# Patient Record
Sex: Female | Born: 1955 | Race: White | Hispanic: No | Smoking: Never smoker
Health system: Southern US, Community
[De-identification: ages and names within clinical notes are randomized; demographics above are authoritative.]

## PROBLEM LIST (undated history)

## (undated) DIAGNOSIS — N390 Urinary tract infection, site not specified: Secondary | ICD-10-CM

## (undated) DIAGNOSIS — E785 Hyperlipidemia, unspecified: Secondary | ICD-10-CM

## (undated) DIAGNOSIS — N3281 Overactive bladder: Secondary | ICD-10-CM

## (undated) DIAGNOSIS — G245 Blepharospasm: Secondary | ICD-10-CM

## (undated) DIAGNOSIS — F329 Major depressive disorder, single episode, unspecified: Secondary | ICD-10-CM

## (undated) DIAGNOSIS — F32A Depression, unspecified: Secondary | ICD-10-CM

## (undated) DIAGNOSIS — I10 Essential (primary) hypertension: Secondary | ICD-10-CM

## (undated) DIAGNOSIS — F419 Anxiety disorder, unspecified: Secondary | ICD-10-CM

## (undated) DIAGNOSIS — E119 Type 2 diabetes mellitus without complications: Secondary | ICD-10-CM

## (undated) DIAGNOSIS — K589 Irritable bowel syndrome without diarrhea: Secondary | ICD-10-CM

## (undated) DIAGNOSIS — K219 Gastro-esophageal reflux disease without esophagitis: Secondary | ICD-10-CM

## (undated) DIAGNOSIS — G4730 Sleep apnea, unspecified: Secondary | ICD-10-CM

## (undated) HISTORY — DX: Overactive bladder: N32.81

## (undated) HISTORY — DX: Hyperlipidemia, unspecified: E78.5

## (undated) HISTORY — DX: Urinary tract infection, site not specified: N39.0

## (undated) HISTORY — DX: Irritable bowel syndrome, unspecified: K58.9

## (undated) HISTORY — DX: Gastro-esophageal reflux disease without esophagitis: K21.9

## (undated) HISTORY — PX: CARPAL TUNNEL RELEASE: SHX101

## (undated) HISTORY — DX: Anxiety disorder, unspecified: F41.9

## (undated) HISTORY — PX: CHOLECYSTECTOMY: SHX55

## (undated) HISTORY — PX: COLONOSCOPY: SHX174

## (undated) HISTORY — DX: Blepharospasm: G24.5

## (undated) HISTORY — DX: Type 2 diabetes mellitus without complications: E11.9

## (undated) HISTORY — DX: Major depressive disorder, single episode, unspecified: F32.9

## (undated) HISTORY — DX: Depression, unspecified: F32.A

## (undated) HISTORY — DX: Essential (primary) hypertension: I10

---

## 2000-12-21 ENCOUNTER — Encounter: Payer: Self-pay | Admitting: Emergency Medicine

## 2000-12-21 ENCOUNTER — Emergency Department (HOSPITAL_COMMUNITY): Admission: EM | Admit: 2000-12-21 | Discharge: 2000-12-21 | Payer: Self-pay | Admitting: Emergency Medicine

## 2000-12-28 ENCOUNTER — Encounter: Payer: Self-pay | Admitting: Internal Medicine

## 2000-12-28 ENCOUNTER — Ambulatory Visit (HOSPITAL_COMMUNITY): Admission: RE | Admit: 2000-12-28 | Discharge: 2000-12-28 | Payer: Self-pay | Admitting: Internal Medicine

## 2003-12-17 ENCOUNTER — Ambulatory Visit (HOSPITAL_COMMUNITY): Admission: RE | Admit: 2003-12-17 | Discharge: 2003-12-17 | Payer: Self-pay | Admitting: Internal Medicine

## 2004-02-15 ENCOUNTER — Ambulatory Visit (HOSPITAL_COMMUNITY): Admission: RE | Admit: 2004-02-15 | Discharge: 2004-02-15 | Payer: Self-pay | Admitting: Family Medicine

## 2005-01-04 ENCOUNTER — Ambulatory Visit (HOSPITAL_COMMUNITY): Admission: RE | Admit: 2005-01-04 | Discharge: 2005-01-04 | Payer: Self-pay | Admitting: Pulmonary Disease

## 2005-09-25 ENCOUNTER — Ambulatory Visit: Payer: Self-pay | Admitting: Psychiatry

## 2005-09-25 ENCOUNTER — Other Ambulatory Visit (HOSPITAL_COMMUNITY): Admission: RE | Admit: 2005-09-25 | Discharge: 2005-12-24 | Payer: Self-pay | Admitting: Psychiatry

## 2006-01-29 ENCOUNTER — Ambulatory Visit (HOSPITAL_COMMUNITY): Payer: Self-pay | Admitting: Psychiatry

## 2006-03-29 ENCOUNTER — Ambulatory Visit (HOSPITAL_COMMUNITY): Payer: Self-pay | Admitting: *Deleted

## 2006-05-22 ENCOUNTER — Ambulatory Visit (HOSPITAL_COMMUNITY): Payer: Self-pay | Admitting: *Deleted

## 2006-06-26 ENCOUNTER — Ambulatory Visit (HOSPITAL_COMMUNITY): Payer: Self-pay | Admitting: *Deleted

## 2006-07-17 ENCOUNTER — Ambulatory Visit (HOSPITAL_COMMUNITY): Payer: Self-pay | Admitting: *Deleted

## 2006-08-21 ENCOUNTER — Ambulatory Visit (HOSPITAL_COMMUNITY): Payer: Self-pay | Admitting: *Deleted

## 2006-10-18 ENCOUNTER — Ambulatory Visit (HOSPITAL_COMMUNITY): Payer: Self-pay | Admitting: *Deleted

## 2006-11-13 ENCOUNTER — Ambulatory Visit (HOSPITAL_COMMUNITY): Payer: Self-pay | Admitting: *Deleted

## 2006-12-18 ENCOUNTER — Ambulatory Visit (HOSPITAL_COMMUNITY): Payer: Self-pay | Admitting: *Deleted

## 2007-01-15 ENCOUNTER — Ambulatory Visit (HOSPITAL_COMMUNITY): Payer: Self-pay | Admitting: *Deleted

## 2007-02-19 ENCOUNTER — Ambulatory Visit (HOSPITAL_COMMUNITY): Payer: Self-pay | Admitting: *Deleted

## 2007-03-19 ENCOUNTER — Ambulatory Visit (HOSPITAL_COMMUNITY): Payer: Self-pay | Admitting: *Deleted

## 2007-04-16 ENCOUNTER — Ambulatory Visit (HOSPITAL_COMMUNITY): Payer: Self-pay | Admitting: *Deleted

## 2007-06-18 ENCOUNTER — Ambulatory Visit (HOSPITAL_COMMUNITY): Payer: Self-pay | Admitting: *Deleted

## 2007-07-16 ENCOUNTER — Ambulatory Visit (HOSPITAL_COMMUNITY): Payer: Self-pay | Admitting: *Deleted

## 2007-09-17 ENCOUNTER — Ambulatory Visit (HOSPITAL_COMMUNITY): Payer: Self-pay | Admitting: *Deleted

## 2007-10-15 ENCOUNTER — Ambulatory Visit (HOSPITAL_COMMUNITY): Payer: Self-pay | Admitting: *Deleted

## 2007-11-12 ENCOUNTER — Ambulatory Visit (HOSPITAL_COMMUNITY): Payer: Self-pay | Admitting: *Deleted

## 2007-12-17 ENCOUNTER — Ambulatory Visit (HOSPITAL_COMMUNITY): Payer: Self-pay | Admitting: *Deleted

## 2008-01-14 ENCOUNTER — Ambulatory Visit (HOSPITAL_COMMUNITY): Payer: Self-pay | Admitting: *Deleted

## 2008-02-13 ENCOUNTER — Ambulatory Visit (HOSPITAL_COMMUNITY): Payer: Self-pay | Admitting: *Deleted

## 2008-02-27 ENCOUNTER — Ambulatory Visit (HOSPITAL_COMMUNITY): Admission: RE | Admit: 2008-02-27 | Discharge: 2008-02-27 | Payer: Self-pay | Admitting: Family Medicine

## 2008-03-12 ENCOUNTER — Ambulatory Visit (HOSPITAL_COMMUNITY): Payer: Self-pay | Admitting: *Deleted

## 2008-04-21 ENCOUNTER — Ambulatory Visit (HOSPITAL_COMMUNITY): Payer: Self-pay | Admitting: *Deleted

## 2008-06-16 ENCOUNTER — Ambulatory Visit (HOSPITAL_COMMUNITY): Payer: Self-pay | Admitting: *Deleted

## 2008-08-18 ENCOUNTER — Ambulatory Visit (HOSPITAL_COMMUNITY): Payer: Self-pay | Admitting: *Deleted

## 2008-10-15 ENCOUNTER — Ambulatory Visit (HOSPITAL_COMMUNITY): Payer: Self-pay | Admitting: *Deleted

## 2008-12-29 ENCOUNTER — Ambulatory Visit (HOSPITAL_COMMUNITY): Payer: Self-pay | Admitting: *Deleted

## 2009-03-04 ENCOUNTER — Ambulatory Visit (HOSPITAL_COMMUNITY): Payer: Self-pay | Admitting: *Deleted

## 2009-07-28 ENCOUNTER — Ambulatory Visit (HOSPITAL_COMMUNITY): Payer: Self-pay | Admitting: Psychiatry

## 2009-08-23 ENCOUNTER — Ambulatory Visit (HOSPITAL_COMMUNITY): Payer: Self-pay | Admitting: Psychiatry

## 2009-09-23 ENCOUNTER — Ambulatory Visit (HOSPITAL_COMMUNITY): Payer: Self-pay | Admitting: Psychiatry

## 2009-10-14 ENCOUNTER — Ambulatory Visit (HOSPITAL_COMMUNITY): Payer: Self-pay | Admitting: Psychiatry

## 2009-11-11 ENCOUNTER — Ambulatory Visit (HOSPITAL_COMMUNITY): Payer: Self-pay | Admitting: Psychiatry

## 2009-12-14 ENCOUNTER — Ambulatory Visit (HOSPITAL_COMMUNITY): Payer: Self-pay | Admitting: Psychiatry

## 2010-01-13 ENCOUNTER — Ambulatory Visit (HOSPITAL_COMMUNITY): Payer: Self-pay | Admitting: Psychiatry

## 2010-02-08 ENCOUNTER — Ambulatory Visit (HOSPITAL_COMMUNITY): Admission: RE | Admit: 2010-02-08 | Discharge: 2010-02-08 | Payer: Self-pay | Admitting: Internal Medicine

## 2010-02-23 ENCOUNTER — Ambulatory Visit (HOSPITAL_COMMUNITY): Admission: RE | Admit: 2010-02-23 | Discharge: 2010-02-23 | Payer: Self-pay | Admitting: Family Medicine

## 2010-03-15 ENCOUNTER — Ambulatory Visit (HOSPITAL_COMMUNITY): Payer: Self-pay | Admitting: Psychiatry

## 2010-05-12 ENCOUNTER — Ambulatory Visit (HOSPITAL_COMMUNITY): Payer: Self-pay | Admitting: Psychiatry

## 2010-06-10 ENCOUNTER — Ambulatory Visit (HOSPITAL_COMMUNITY): Admission: RE | Admit: 2010-06-10 | Discharge: 2010-06-10 | Payer: Self-pay | Admitting: Family Medicine

## 2010-06-23 ENCOUNTER — Ambulatory Visit (HOSPITAL_COMMUNITY): Payer: Self-pay | Admitting: Psychiatry

## 2010-08-15 ENCOUNTER — Ambulatory Visit (HOSPITAL_COMMUNITY)
Admission: RE | Admit: 2010-08-15 | Discharge: 2010-08-15 | Payer: Self-pay | Source: Home / Self Care | Admitting: Family Medicine

## 2010-08-25 ENCOUNTER — Ambulatory Visit (HOSPITAL_COMMUNITY): Payer: Self-pay | Admitting: Psychiatry

## 2010-09-09 ENCOUNTER — Ambulatory Visit
Admission: RE | Admit: 2010-09-09 | Discharge: 2010-09-09 | Payer: Self-pay | Source: Home / Self Care | Attending: Orthopedic Surgery | Admitting: Orthopedic Surgery

## 2010-11-15 ENCOUNTER — Encounter (INDEPENDENT_AMBULATORY_CARE_PROVIDER_SITE_OTHER): Payer: 59 | Admitting: Psychiatry

## 2010-11-15 DIAGNOSIS — F331 Major depressive disorder, recurrent, moderate: Secondary | ICD-10-CM

## 2010-11-21 LAB — POCT HEMOGLOBIN-HEMACUE: Hemoglobin: 13.4 g/dL (ref 12.0–15.0)

## 2010-12-15 ENCOUNTER — Encounter (INDEPENDENT_AMBULATORY_CARE_PROVIDER_SITE_OTHER): Payer: 59 | Admitting: Psychiatry

## 2010-12-15 DIAGNOSIS — F331 Major depressive disorder, recurrent, moderate: Secondary | ICD-10-CM

## 2011-01-12 ENCOUNTER — Encounter (INDEPENDENT_AMBULATORY_CARE_PROVIDER_SITE_OTHER): Payer: 59 | Admitting: Psychiatry

## 2011-01-12 DIAGNOSIS — F331 Major depressive disorder, recurrent, moderate: Secondary | ICD-10-CM

## 2011-01-27 NOTE — Procedures (Signed)
Aimee Reed, Aimee Reed               ACCOUNT NO.:  [id number]   MEDICAL RECORD NO.:  [id number]          PATIENT TYPE:  OUT   LOCATION:  RAD                           FACILITY:  [facility]   PHYSICIAN:  Edward L. Juanetta Gosling, M.D.DATE OF BIRTH:  Feb 08, 1956   DATE OF PROCEDURE:  01/04/2005  DATE OF DISCHARGE:                              PULMONARY FUNCTION TEST   INTERPRETATION:  1.  Spirometry is normal.  2.  Lung volumes are normal.  3.  DLCO is minimally reduced.  4.  Arterial blood gases are normal.      ELH/MEDQ  D:  01/04/2005  T:  01/04/2005  Job:  [id number]

## 2011-02-18 ENCOUNTER — Emergency Department (HOSPITAL_COMMUNITY)
Admission: EM | Admit: 2011-02-18 | Discharge: 2011-02-18 | Disposition: A | Discharge status: Home / Self Care | Payer: Medicare Other | Attending: Emergency Medicine | Admitting: Emergency Medicine

## 2011-02-18 ENCOUNTER — Emergency Department (HOSPITAL_COMMUNITY): Payer: Medicare Other

## 2011-02-18 DIAGNOSIS — F3289 Other specified depressive episodes: Secondary | ICD-10-CM | POA: Insufficient documentation

## 2011-02-18 DIAGNOSIS — K219 Gastro-esophageal reflux disease without esophagitis: Secondary | ICD-10-CM | POA: Insufficient documentation

## 2011-02-18 DIAGNOSIS — F329 Major depressive disorder, single episode, unspecified: Secondary | ICD-10-CM | POA: Insufficient documentation

## 2011-02-18 DIAGNOSIS — R209 Unspecified disturbances of skin sensation: Secondary | ICD-10-CM | POA: Insufficient documentation

## 2011-02-18 DIAGNOSIS — R51 Headache: Secondary | ICD-10-CM | POA: Insufficient documentation

## 2011-02-18 DIAGNOSIS — Z79899 Other long term (current) drug therapy: Secondary | ICD-10-CM | POA: Insufficient documentation

## 2011-02-18 LAB — CBC
HCT: 40.9 % (ref 36.0–46.0)
Hemoglobin: 13.4 g/dL (ref 12.0–15.0)
MCH: 30 pg (ref 26.0–34.0)
MCV: 91.5 fL (ref 78.0–100.0)
Platelets: 233 10*3/uL (ref 150–400)
RDW: 13.3 % (ref 11.5–15.5)

## 2011-02-18 LAB — BASIC METABOLIC PANEL
BUN: 9 mg/dL (ref 6–23)
Chloride: 98 mEq/L (ref 96–112)
Glucose, Bld: 138 mg/dL — ABNORMAL HIGH (ref 70–99)
Potassium: 3.7 mEq/L (ref 3.5–5.1)

## 2011-02-18 LAB — URINALYSIS, ROUTINE W REFLEX MICROSCOPIC: Glucose, UA: NEGATIVE mg/dL

## 2011-02-18 LAB — CK TOTAL AND CKMB (NOT AT ARMC): Total CK: 382 U/L — ABNORMAL HIGH (ref 7–177)

## 2011-02-20 ENCOUNTER — Other Ambulatory Visit (HOSPITAL_COMMUNITY): Payer: Self-pay | Admitting: Internal Medicine

## 2011-02-20 DIAGNOSIS — R51 Headache: Secondary | ICD-10-CM

## 2011-02-20 DIAGNOSIS — G459 Transient cerebral ischemic attack, unspecified: Secondary | ICD-10-CM

## 2011-02-20 DIAGNOSIS — I10 Essential (primary) hypertension: Secondary | ICD-10-CM

## 2011-02-21 ENCOUNTER — Encounter (HOSPITAL_COMMUNITY): Payer: 59 | Admitting: Psychiatry

## 2011-02-22 ENCOUNTER — Ambulatory Visit (HOSPITAL_COMMUNITY)
Admission: RE | Admit: 2011-02-22 | Discharge: 2011-02-22 | Disposition: A | Discharge status: Home / Self Care | Payer: Medicare Other | Source: Ambulatory Visit | Attending: Internal Medicine | Admitting: Internal Medicine

## 2011-02-22 DIAGNOSIS — R51 Headache: Secondary | ICD-10-CM | POA: Insufficient documentation

## 2011-02-22 DIAGNOSIS — R209 Unspecified disturbances of skin sensation: Secondary | ICD-10-CM | POA: Insufficient documentation

## 2011-02-22 DIAGNOSIS — I10 Essential (primary) hypertension: Secondary | ICD-10-CM

## 2011-02-22 DIAGNOSIS — G459 Transient cerebral ischemic attack, unspecified: Secondary | ICD-10-CM | POA: Insufficient documentation

## 2011-02-23 ENCOUNTER — Encounter (INDEPENDENT_AMBULATORY_CARE_PROVIDER_SITE_OTHER): Payer: Medicare Other | Admitting: Psychiatry

## 2011-02-23 DIAGNOSIS — F331 Major depressive disorder, recurrent, moderate: Secondary | ICD-10-CM

## 2011-03-23 ENCOUNTER — Other Ambulatory Visit (HOSPITAL_COMMUNITY): Payer: Self-pay | Admitting: Internal Medicine

## 2011-03-23 DIAGNOSIS — R51 Headache: Secondary | ICD-10-CM

## 2011-03-24 ENCOUNTER — Ambulatory Visit (HOSPITAL_COMMUNITY)
Admission: RE | Admit: 2011-03-24 | Discharge: 2011-03-24 | Disposition: A | Discharge status: Home / Self Care | Payer: Medicare Other | Source: Ambulatory Visit | Attending: Internal Medicine | Admitting: Internal Medicine

## 2011-03-24 DIAGNOSIS — R51 Headache: Secondary | ICD-10-CM | POA: Insufficient documentation

## 2011-03-24 DIAGNOSIS — H538 Other visual disturbances: Secondary | ICD-10-CM | POA: Insufficient documentation

## 2011-04-20 ENCOUNTER — Encounter (INDEPENDENT_AMBULATORY_CARE_PROVIDER_SITE_OTHER): Payer: Medicare Other | Admitting: Psychiatry

## 2011-04-20 DIAGNOSIS — F331 Major depressive disorder, recurrent, moderate: Secondary | ICD-10-CM

## 2011-06-20 ENCOUNTER — Encounter (INDEPENDENT_AMBULATORY_CARE_PROVIDER_SITE_OTHER): Payer: Medicare Other | Admitting: Psychiatry

## 2011-06-20 DIAGNOSIS — F331 Major depressive disorder, recurrent, moderate: Secondary | ICD-10-CM

## 2011-07-18 ENCOUNTER — Encounter (HOSPITAL_COMMUNITY): Payer: Self-pay | Admitting: Psychiatry

## 2011-07-18 ENCOUNTER — Ambulatory Visit (INDEPENDENT_AMBULATORY_CARE_PROVIDER_SITE_OTHER): Payer: Medicare Other | Admitting: Psychiatry

## 2011-07-18 VITALS — Wt 263.0 lb

## 2011-07-18 DIAGNOSIS — F331 Major depressive disorder, recurrent, moderate: Secondary | ICD-10-CM

## 2011-07-18 MED ORDER — ZIPRASIDONE HCL 40 MG PO CAPS: 40.0000 mg | ORAL_CAPSULE | Freq: Once | ORAL | Status: DC

## 2011-07-18 MED ORDER — BUPROPION HCL ER (XL) 300 MG PO TB24: 300.0000 mg | ORAL_TABLET | ORAL | Status: DC

## 2011-07-18 NOTE — Progress Notes (Signed)
Patient seen today for her followup appointment. She is now taking Geodon 40 mg. She is sleeping better and does not have any complaints of excessive sleep. Her mood has been also improved along with her attention and concentration. There are no anger crying spells agitation or social isolation. She does not want to change her current medication. She reported no side effects of medication. There are no abnormal movements noted. Patient is anxious but pleasant and cooperative she denies any active or passive suicidal thinking or any auditory or visual hallucination. Her attention and concentration is improved. There were no psychotic symptoms present. She is alert and oriented x3. Her memory and concentration is okay. Assessment Maj. depressive disorder recurrent Plan I have reviewed all his medication history we will continue her medication which are Cymbalta 30 mg daily Wellbutrin 300 mg daily and Geodon 40 mg daily I have explained the risks and benefits of medication in detail and I will see her again in 2 months, time spent 20 minutes. 8 weeks of Cymbalta 30 mg samples were given

## 2011-07-18 NOTE — Patient Instructions (Signed)
Take all you medications and see [place] in two months

## 2011-07-19 ENCOUNTER — Ambulatory Visit (INDEPENDENT_AMBULATORY_CARE_PROVIDER_SITE_OTHER): Payer: Medicare Other | Admitting: Gastroenterology

## 2011-07-19 ENCOUNTER — Encounter: Payer: Self-pay | Admitting: Gastroenterology

## 2011-07-19 VITALS — BP 151/88 | HR 90 | Temp 98.9°F | Ht 64.0 in | Wt 264.4 lb

## 2011-07-19 DIAGNOSIS — R1314 Dysphagia, pharyngoesophageal phase: Secondary | ICD-10-CM

## 2011-07-19 DIAGNOSIS — K219 Gastro-esophageal reflux disease without esophagitis: Secondary | ICD-10-CM

## 2011-07-19 DIAGNOSIS — R1310 Dysphagia, unspecified: Secondary | ICD-10-CM

## 2011-07-19 NOTE — Patient Instructions (Signed)
We have scheduled you for an upper endoscopy. Please see separate instructions.  I have requested your recent labwork and past colonoscopy, we will call you if you need any further instructions.

## 2011-07-19 NOTE — Progress Notes (Signed)
Primary Care Physician:  Cassell Smiles., MD  Primary Gastroenterologist:  Roetta Sessions, MD   Chief Complaint  Patient presents with  . Dysphagia    with pills and some food    HPI:  Aimee Reed is a 55 y.o. female here for further evaluation of esophageal dysphagia. She complains of difficulty swallowing for some time, worsening recently. She has most problems swallowing bread and apples. She also has problems swallowing her pills especially the one she takes at night. Sometimes it takes her 45 minutes to wash the pills down with food. Denies odynophagia, vomiting, abdominal pain. Heartburn on prilosec for one year. Denies constipation, diarrhea, melena, rectal bleeding. No prior upper endoscopy.   Current Outpatient Prescriptions  Medication Sig Dispense Refill  . amLODipine (NORVASC) 5 MG tablet Take 5 mg by mouth daily.        Marland Kitchen aspirin 81 MG tablet Take 81 mg by mouth daily.        Marland Kitchen buPROPion (WELLBUTRIN XL) 300 MG 24 hr tablet Take 1 tablet (300 mg total) by mouth every morning.  30 tablet  1  . DULoxetine (CYMBALTA) 30 MG capsule Take 30 mg by mouth daily.        Marland Kitchen EVENING PRIMROSE OIL PO Take by mouth daily.        . Nitrofurantoin Macrocrystal (MACRODANTIN PO) Take by mouth daily.        . Nutritional Supplements (ESTROVEN PO) Take by mouth daily.        Marland Kitchen omeprazole (PRILOSEC) 20 MG capsule Take 20 mg by mouth daily.        . ziprasidone (GEODON) 40 MG capsule Take 1 capsule (40 mg total) by mouth once.  30 capsule  1    Allergies as of 07/19/2011  . (No Known Allergies)    Past Medical History  Diagnosis Date  . GERD (gastroesophageal reflux disease)   . Irritable bowel syndrome   . Overactive bladder   . HTN (hypertension)   . Recurrent UTI     chronic macrodantin    Past Surgical History  Procedure Date  . Cholecystectomy   . Carpal tunnel release     both hands  . Colonoscopy     Dr. Lovell Sheehan, around 2010    Family History  Problem Relation Age  of Onset  . Depression Father   . Colon cancer Neg Hx   . Liver disease Neg Hx   . Ulcers Mother     History   Social History  . Marital Status: Married    Spouse Name: N/A    Number of Children: 2  . Years of Education: N/A   Occupational History  . unemployed    Social History Main Topics  . Smoking status: Never Smoker   . Smokeless tobacco: Not on file  . Alcohol Use: No  . Drug Use: No  . Sexually Active: Not on file   Other Topics Concern  . Not on file   Social History Narrative  . No narrative on file      ROS:  General: Negative for anorexia, weight loss, fever, chills, fatigue, weakness. Eyes: Negative for vision changes.  ENT: Negative for hoarseness, difficulty swallowing , nasal congestion. CV: Negative for chest pain, angina, palpitations, dyspnea on exertion, peripheral edema.  Respiratory: Negative for dyspnea at rest, dyspnea on exertion, cough, sputum, wheezing.  GI: See history of present illness. GU:  Negative for dysuria, hematuria, urinary incontinence, urinary frequency, nocturnal urination.  MS: Negative  for joint pain, low back pain.  Derm: Negative for rash or itching.  Neuro: Negative for weakness, abnormal sensation, seizure, frequent headaches, memory loss, confusion.  Psych: Positive for depression and anxiety but states both are well controlled. Negative for suicidal ideation, hallucinations.  Endo: Negative for unusual weight change.  Heme: Negative for bruising or bleeding. Allergy: Negative for rash or hives.    Physical Examination:  BP 151/88  Pulse 90  Temp(Src) 98.9 F (37.2 C) (Temporal)  Ht 5\' 4"  (1.626 m)  Wt 264 lb 6.4 oz (119.931 kg)  BMI 45.38 kg/m2   General: Well-nourished, well-developed in no acute distress. Obese. Head: Normocephalic, atraumatic.   Eyes: Conjunctiva pink, no icterus. Mouth: Oropharyngeal mucosa moist and pink , no lesions erythema or exudate. Neck: Supple without thyromegaly, masses, or  lymphadenopathy.  Lungs: Clear to auscultation bilaterally.  Heart: Regular rate and rhythm, no murmurs rubs or gallops.  Abdomen: Bowel sounds are normal, nontender, nondistended, no hepatosplenomegaly or masses, no abdominal bruits or    hernia , no rebound or guarding.   Rectal: Not performed. Extremities: Trace lower extremity edema. No clubbing or deformities.  Neuro: Alert and oriented x 4 , grossly normal neurologically.  Skin: Warm and dry, no rash or jaundice.   Psych: Alert and cooperative, normal mood and affect.  Labs: Lab Results  Component Value Date   WBC 8.1 02/18/2011   HGB 13.4 02/18/2011   HCT 40.9 02/18/2011   MCV 91.5 02/18/2011   PLT 233 02/18/2011   Lab Results  Component Value Date   CREATININE 0.65 02/18/2011   BUN 9 02/18/2011   NA 136 02/18/2011   K 3.7 02/18/2011   CL 98 02/18/2011   CO2 27 02/18/2011   No results found for this basename: ALT, AST, GGT, ALKPHOS, BILITOT     Imaging Studies: No results found.

## 2011-07-19 NOTE — Progress Notes (Signed)
Cc to PCP 

## 2011-07-19 NOTE — Assessment & Plan Note (Signed)
Chronic worsening esophageal dysphagia to solid foods and pills. Differential diagnosis includes esophageal web, ring, stricture, esophagitis. Recommend EGD with possible dilation in the near future. She states she had adequate conscious sedation previously at time of colonoscopy 2 years ago while she was on multiple antidepressants. I will retrieve those records and if there are any concerns we will regroup and plan for deep sedation. Patient is in agreement with this approach.  I have discussed the risks, alternatives, benefits with regards to but not limited to the risk of reaction to medication, bleeding, infection, perforation and the patient is agreeable to proceed. Written consent to be obtained.

## 2011-07-25 ENCOUNTER — Encounter (HOSPITAL_COMMUNITY): Payer: Self-pay | Admitting: Pharmacy Technician

## 2011-07-28 MED ORDER — SODIUM CHLORIDE 0.45 % IV SOLN
Freq: Once | INTRAVENOUS | Status: AC
  Administered 2011-07-31: 12:00:00 via INTRAVENOUS

## 2011-07-31 ENCOUNTER — Other Ambulatory Visit: Payer: Self-pay | Admitting: Internal Medicine

## 2011-07-31 ENCOUNTER — Encounter (HOSPITAL_COMMUNITY): Payer: Self-pay

## 2011-07-31 ENCOUNTER — Ambulatory Visit (HOSPITAL_COMMUNITY)
Admission: RE | Admit: 2011-07-31 | Discharge: 2011-07-31 | Disposition: A | Discharge status: Home / Self Care | Payer: Medicare Other | Source: Ambulatory Visit | Attending: Internal Medicine | Admitting: Internal Medicine

## 2011-07-31 ENCOUNTER — Encounter (HOSPITAL_COMMUNITY)
Admission: RE | Disposition: A | Discharge status: Home / Self Care | Payer: Self-pay | Source: Ambulatory Visit | Attending: Internal Medicine

## 2011-07-31 DIAGNOSIS — R1310 Dysphagia, unspecified: Secondary | ICD-10-CM | POA: Insufficient documentation

## 2011-07-31 DIAGNOSIS — Z79899 Other long term (current) drug therapy: Secondary | ICD-10-CM | POA: Insufficient documentation

## 2011-07-31 DIAGNOSIS — Z7982 Long term (current) use of aspirin: Secondary | ICD-10-CM | POA: Insufficient documentation

## 2011-07-31 DIAGNOSIS — I10 Essential (primary) hypertension: Secondary | ICD-10-CM | POA: Insufficient documentation

## 2011-07-31 DIAGNOSIS — K219 Gastro-esophageal reflux disease without esophagitis: Secondary | ICD-10-CM

## 2011-07-31 DIAGNOSIS — Q391 Atresia of esophagus with tracheo-esophageal fistula: Secondary | ICD-10-CM | POA: Insufficient documentation

## 2011-07-31 DIAGNOSIS — K2270 Barrett's esophagus without dysplasia: Secondary | ICD-10-CM | POA: Insufficient documentation

## 2011-07-31 DIAGNOSIS — R635 Abnormal weight gain: Secondary | ICD-10-CM | POA: Insufficient documentation

## 2011-07-31 HISTORY — PX: ESOPHAGOGASTRODUODENOSCOPY: SHX5428

## 2011-07-31 SURGERY — EGD (ESOPHAGOGASTRODUODENOSCOPY)
Anesthesia: Moderate Sedation

## 2011-07-31 MED ORDER — MEPERIDINE HCL 100 MG/ML IJ SOLN
INTRAMUSCULAR | Status: AC
  Filled 2011-07-31: qty 2

## 2011-07-31 MED ORDER — OMEPRAZOLE 20 MG PO CPDR: 20.0000 mg | DELAYED_RELEASE_CAPSULE | Freq: Two times a day (BID) | ORAL | Status: DC

## 2011-07-31 MED ORDER — MIDAZOLAM HCL 5 MG/5ML IJ SOLN
INTRAMUSCULAR | Status: AC
  Filled 2011-07-31: qty 10

## 2011-07-31 MED ORDER — STERILE WATER FOR IRRIGATION IR SOLN
Status: DC | PRN
  Administered 2011-07-31: 13:00:00

## 2011-07-31 MED ORDER — BUTAMBEN-TETRACAINE-BENZOCAINE 2-2-14 % EX AERO
INHALATION_SPRAY | CUTANEOUS | Status: DC | PRN
  Administered 2011-07-31: 2 via TOPICAL

## 2011-07-31 MED ORDER — MEPERIDINE HCL 100 MG/ML IJ SOLN
INTRAMUSCULAR | Status: DC | PRN
  Administered 2011-07-31: 50 mg via INTRAVENOUS
  Administered 2011-07-31: 25 mg via INTRAVENOUS

## 2011-07-31 MED ORDER — MIDAZOLAM HCL 5 MG/5ML IJ SOLN
INTRAMUSCULAR | Status: DC | PRN
  Administered 2011-07-31: 2 mg via INTRAVENOUS
  Administered 2011-07-31: 1 mg via INTRAVENOUS
  Administered 2011-07-31: 2 mg via INTRAVENOUS

## 2011-07-31 NOTE — H&P (Signed)
  I have seen & examined the patient prior to the procedure(s) today and reviewed the history and physical/consultation.  There have been no changes.  After consideration of the risks, benefits, alternatives and imponderables, the patient has consented to the procedure(s).   

## 2011-08-07 ENCOUNTER — Encounter (HOSPITAL_COMMUNITY): Payer: Self-pay | Admitting: Internal Medicine

## 2011-08-10 ENCOUNTER — Encounter: Payer: Self-pay | Admitting: Internal Medicine

## 2011-08-12 ENCOUNTER — Other Ambulatory Visit (HOSPITAL_COMMUNITY): Payer: Self-pay

## 2011-09-19 ENCOUNTER — Ambulatory Visit (HOSPITAL_COMMUNITY): Payer: Medicare Other | Admitting: Psychiatry

## 2011-10-12 ENCOUNTER — Ambulatory Visit (INDEPENDENT_AMBULATORY_CARE_PROVIDER_SITE_OTHER): Payer: Medicare Other | Admitting: Psychiatry

## 2011-10-12 ENCOUNTER — Encounter (HOSPITAL_COMMUNITY): Payer: Self-pay | Admitting: Psychiatry

## 2011-10-12 VITALS — Wt 265.0 lb

## 2011-10-12 DIAGNOSIS — F331 Major depressive disorder, recurrent, moderate: Secondary | ICD-10-CM

## 2011-10-12 MED ORDER — ZIPRASIDONE HCL 40 MG PO CAPS: 40.0000 mg | ORAL_CAPSULE | Freq: Once | ORAL | Status: DC

## 2011-10-12 MED ORDER — BUPROPION HCL ER (XL) 300 MG PO TB24: 300.0000 mg | ORAL_TABLET | ORAL | Status: DC

## 2011-10-12 NOTE — Progress Notes (Signed)
Patient seen today for her followup appointment. She had a very good Christmas. She likes her current medication. She is not feeling tired in the morning. She denies any crying spells or any agitation. She reported her current regime is working very well. She has no side effects or concern about the medication. She is also seeing therapist Dr. Fransico Michael on regular basis.   Mental status examination Patient is anxious but pleasant and cooperative she denies any active or passive suicidal thinking or any auditory or visual hallucination. She described her mood is good and her affect is mood congruent. Her attention and concentration is improved. There were no psychotic symptoms present. She is alert and oriented x3. Her memory and concentration is okay.  Assessment Maj. depressive disorder recurrent  Plan I will continue her current medication which is Cymbalta Wellbutrin and Geodon. I explained risks and benefits of medication. We had tried in the past with one antidepressant however patient decompensated. So far she is doing better on this regime.  I will see her again in 2 months. Samples of Cymbalta 30 mg given

## 2011-12-05 ENCOUNTER — Other Ambulatory Visit (HOSPITAL_COMMUNITY): Payer: Self-pay | Admitting: Family Medicine

## 2011-12-05 DIAGNOSIS — Z139 Encounter for screening, unspecified: Secondary | ICD-10-CM

## 2011-12-07 ENCOUNTER — Encounter (HOSPITAL_COMMUNITY): Payer: Self-pay | Admitting: Psychiatry

## 2011-12-07 ENCOUNTER — Ambulatory Visit (INDEPENDENT_AMBULATORY_CARE_PROVIDER_SITE_OTHER): Payer: Medicare Other | Admitting: Psychiatry

## 2011-12-07 VITALS — Wt 269.0 lb

## 2011-12-07 DIAGNOSIS — F331 Major depressive disorder, recurrent, moderate: Secondary | ICD-10-CM

## 2011-12-07 MED ORDER — ZIPRASIDONE HCL 40 MG PO CAPS: 40.0000 mg | ORAL_CAPSULE | Freq: Once | ORAL | Status: DC

## 2011-12-07 MED ORDER — DULOXETINE HCL 30 MG PO CPEP: 30.0000 mg | ORAL_CAPSULE | Freq: Every day | ORAL | Status: DC

## 2011-12-07 MED ORDER — BUPROPION HCL ER (XL) 300 MG PO TB24: 300.0000 mg | ORAL_TABLET | ORAL | Status: DC

## 2011-12-07 NOTE — Progress Notes (Signed)
Chief complaint  Medication management and followup.  History of present illness Patient is 56 year old Caucasian married female who came for her followup appointment.  She is wearing dark glasses has recently she has difficulty seeing things and bright light .  She was seen by her primary care physician who recommended MRI and referred to ophthalmologist at [facility] .  She is concerned about her vision but overall her anxiety and depression as controlled.  She is compliant with her medication and reported no side effects.  She is sleeping fine.  She denies any crying spells or any agitation or anger.  She reported no concern side effects of medication.  Recently her primary care physician did blood work and she is waiting for the results .  Current psychiatric medication Geodon 40 mg daily Cymbalta 30 mg daily  Wellbutrin XL 300 mg daily  Past psychiatric history Patient endorse history of depressive symptoms for 9 years.  She has seen outpatient psychiatrist and denies any history of psychiatric inpatient treatment or any suicidal attempt.  In the past she had tried Zoloft, Lexapro, trazodone, Xanax and Ambien.    Medical history Patient has history of overactive bladder, hypertension, irritable bowel syndrome, recurrent UTI and recently vision problem.  Her primary care physician is Dr. Phillips Odor.   Mental status examination Patient is casually dressed and well-groomed.  She is wearing dark glasses .  She is calm and pleasant .  Her speech is fluent clear and coherent.  Her thought process logical linear and goal-directed.  She denies any active or passive suicidal thinking and homicidal thinking.  There were no psychotic symptoms present at this time.  Her attention and concentration is good.  She described her mood is anxious and her affect is mood congruent.  She's alert and oriented x3.  Her insight judgment and impulse control is okay.  Assessment Axis I Major depressive  disorder Axis II deferred Axis III see medical history Axis IV mild to moderate   Plan I will continue her current medication .  We will provide Cymbalta 30 mg samples .  She will required a new prescription of Geodon and Wellbutrin.  I explained risks and benefits of medication in detail.  I recommended to send [place] lab results from her primary care physician which was done recently.  I recommended to call us if he has any question or concern about the medication otherwise I will see her again in 2 months.

## 2011-12-11 ENCOUNTER — Ambulatory Visit (HOSPITAL_COMMUNITY)
Admission: RE | Admit: 2011-12-11 | Discharge: 2011-12-11 | Disposition: A | Discharge status: Home / Self Care | Payer: Medicare Other | Source: Ambulatory Visit | Attending: Family Medicine | Admitting: Family Medicine

## 2011-12-11 DIAGNOSIS — Z1231 Encounter for screening mammogram for malignant neoplasm of breast: Secondary | ICD-10-CM | POA: Insufficient documentation

## 2011-12-11 DIAGNOSIS — Z139 Encounter for screening, unspecified: Secondary | ICD-10-CM

## 2011-12-21 ENCOUNTER — Other Ambulatory Visit (HOSPITAL_COMMUNITY): Payer: Self-pay | Admitting: Family Medicine

## 2011-12-21 DIAGNOSIS — H53149 Visual discomfort, unspecified: Secondary | ICD-10-CM

## 2011-12-25 ENCOUNTER — Ambulatory Visit (HOSPITAL_COMMUNITY): Admission: RE | Admit: 2011-12-25 | Payer: Medicare Other | Source: Ambulatory Visit

## 2012-02-07 ENCOUNTER — Other Ambulatory Visit: Payer: Self-pay | Admitting: Internal Medicine

## 2012-02-08 ENCOUNTER — Ambulatory Visit (INDEPENDENT_AMBULATORY_CARE_PROVIDER_SITE_OTHER): Payer: Medicare Other | Admitting: Psychiatry

## 2012-02-08 ENCOUNTER — Other Ambulatory Visit (HOSPITAL_COMMUNITY): Payer: Self-pay | Admitting: *Deleted

## 2012-02-08 ENCOUNTER — Encounter (HOSPITAL_COMMUNITY): Payer: Self-pay | Admitting: Psychiatry

## 2012-02-08 VITALS — Wt 253.0 lb

## 2012-02-08 DIAGNOSIS — F331 Major depressive disorder, recurrent, moderate: Secondary | ICD-10-CM

## 2012-02-08 MED ORDER — BUPROPION HCL ER (XL) 300 MG PO TB24: 300.0000 mg | ORAL_TABLET | ORAL | Status: DC

## 2012-02-08 MED ORDER — DULOXETINE HCL 30 MG PO CPEP: 30.0000 mg | ORAL_CAPSULE | Freq: Every day | ORAL | Status: DC

## 2012-02-08 NOTE — Progress Notes (Signed)
Chief complaint  I still has issues with my eyes.   History of present illness Patient is 56 year old Caucasian married female who came for her followup appointment.  She is wearing dark glasses.  Patient endorse she has been seeing multiple doctors including ophthalmologist , endocrinologist and neurologist for her vision .  She is frustrated that no one has given the reason why she has difficulty in her vision.  Initially someone told that she has photophobia however ophthalmologist does not believe it is photophobia.  Patient complained of unable to see and bright light.  One of her doctor told to stop Cymbalta to see if Cymbalta causing this problem however she did not see any improvement after stopping Cymbalta to 3 weeks.  She start taking Cymbalta .  At this time she is not taking Geodon as she could not afford the co-pay due to multiple doctor's visit.  She admitted some agitation and anger but overall she is handling her situation better than she was expected.  She sleeping better.  Her doctor is giving pain medication to see if that helps her vision.  She denies any paranoia or any active or passive suicidal thoughts.  She likes to continue Cymbalta and Wellbutrin.    Current psychiatric medication Cymbalta 30 mg daily  Wellbutrin XL 300 mg daily  Past psychiatric history Patient endorse history of depressive symptoms for 9 years.  She has seen outpatient psychiatrist and denies any history of psychiatric inpatient treatment or any suicidal attempt.  In the past she had tried Zoloft, Lexapro, trazodone, Xanax and Ambien.    Medical history Patient has history of overactive bladder, hypertension, irritable bowel syndrome, recurrent UTI and recently vision problem.  Her primary care physician is Dr. Phillips Odor.   Mental status examination Patient is casually dressed and well-groomed.  She is wearing dark glasses .  She is anxious but cooperative.  Her speech is fluent clear and coherent.  Her  thought process logical linear and goal-directed.  She denies any active or passive suicidal thinking and homicidal thinking.  There were no psychotic symptoms present at this time.  Her attention and concentration is fair.  She described her mood is anxious and her affect is mood congruent.  She's alert and oriented x3.  Her insight judgment and impulse control is okay.  Assessment Axis I Major depressive disorder Axis II deferred Axis III see medical history Axis IV mild to moderate   Plan I will discontinue Geodon at this time since patient is visiting multiple Dr. and cannot afford co-pay.  She is doing fairly okay without Geodon.  She will continue Cymbalta and Wellbutrin at present does.  I recommend to call us if she is any question or concern or if she feels worsening of the symptoms.  I will see her again in 2 months.

## 2012-02-08 NOTE — Telephone Encounter (Signed)
Per EPIC, original RX did not reach pharmacy.Resending

## 2012-03-11 DIAGNOSIS — E785 Hyperlipidemia, unspecified: Secondary | ICD-10-CM

## 2012-03-11 HISTORY — DX: Hyperlipidemia, unspecified: E78.5

## 2012-04-03 ENCOUNTER — Other Ambulatory Visit (HOSPITAL_COMMUNITY): Payer: Self-pay | Admitting: Psychiatry

## 2012-04-03 DIAGNOSIS — F331 Major depressive disorder, recurrent, moderate: Secondary | ICD-10-CM

## 2012-04-09 ENCOUNTER — Encounter (HOSPITAL_COMMUNITY): Payer: Self-pay | Admitting: Psychiatry

## 2012-04-09 ENCOUNTER — Ambulatory Visit (INDEPENDENT_AMBULATORY_CARE_PROVIDER_SITE_OTHER): Payer: Medicare Other | Admitting: Psychiatry

## 2012-04-09 DIAGNOSIS — F331 Major depressive disorder, recurrent, moderate: Secondary | ICD-10-CM

## 2012-04-09 DIAGNOSIS — F329 Major depressive disorder, single episode, unspecified: Secondary | ICD-10-CM

## 2012-04-09 MED ORDER — BUPROPION HCL ER (XL) 300 MG PO TB24: 300.0000 mg | ORAL_TABLET | ORAL | Status: DC

## 2012-04-09 MED ORDER — DULOXETINE HCL 30 MG PO CPEP: 30.0000 mg | ORAL_CAPSULE | Freq: Every day | ORAL | Status: DC

## 2012-04-09 NOTE — Progress Notes (Signed)
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  I will require injections in my eyes.   History of present illness Patient is 56 year old Caucasian married female who came for her followup appointment.  She was recently diagnosed with Blepharospam.  She's relief at least she have diagnosis and she can focus on the treatment.  She was frustrated seeing multiple doctors and getting multiple tests.  She'll start getting injection tomorrow.  She admitted that sometime she feels burdened to other people and she could not see much.  She has difficulty keeping her eyes open however she sleeping better.  She is not taking Geodon.  She denies any paranoia or any hallucination.  She feels nervous but she also feels her current psychiatric medications working well.  She denies any side effects of medication.  She denies any feeling of hopeless or helpless and feel ready for her eye treatment.  She has recent blood work done by primary care physician and she reported her hemoglobin A1c is improved.  She did not remember the exact members.  Current psychiatric medication Cymbalta 30 mg daily  Wellbutrin XL 300 mg daily  Past psychiatric history Patient endorse history of depressive symptoms for 9 years.  She has seen outpatient psychiatrist and denies any history of psychiatric inpatient treatment or any suicidal attempt.  In the past she had tried Zoloft, Lexapro, trazodone, Xanax and Ambien.    Medical history Patient has history of overactive bladder, hypertension, irritable bowel syndrome, recurrent UTI and recently vision problem.  Her primary care physician is Dr. Phillips Odor.   Mental status examination Patient is casually dressed and well-groomed.  She has close eyes.  She comes with her husband.  She is anxious but cooperative.  Her speech is fluent clear and coherent.  Her thought process logical linear and goal-directed.  She denies any active or passive suicidal thinking and homicidal thinking.   There were no psychotic symptoms present at this time.  Her attention and concentration is fair.  She described her mood is anxious and her affect is mood congruent.  She's alert and oriented x3.  Her insight judgment and impulse control is okay.  Assessment Axis I Major depressive disorder Axis II deferred Axis III see medical history Axis IV mild to moderate   Plan I will Cymbalta and Wellbutrin.  I explained the risk and benefits of medication.  I recommend to call us if she is any question or concern or if she feels worsening of the symptoms.  I will see her again in 3 months.

## 2012-04-10 ENCOUNTER — Other Ambulatory Visit (HOSPITAL_COMMUNITY): Payer: Self-pay | Admitting: *Deleted

## 2012-06-11 ENCOUNTER — Encounter: Payer: Self-pay | Admitting: Internal Medicine

## 2012-07-09 ENCOUNTER — Encounter (HOSPITAL_COMMUNITY): Payer: Self-pay | Admitting: Psychiatry

## 2012-07-09 ENCOUNTER — Ambulatory Visit (INDEPENDENT_AMBULATORY_CARE_PROVIDER_SITE_OTHER): Payer: Medicare Other | Admitting: Psychiatry

## 2012-07-09 VITALS — BP 142/100 | Ht 63.5 in | Wt 253.4 lb

## 2012-07-09 DIAGNOSIS — F411 Generalized anxiety disorder: Secondary | ICD-10-CM

## 2012-07-09 DIAGNOSIS — F331 Major depressive disorder, recurrent, moderate: Secondary | ICD-10-CM

## 2012-07-09 DIAGNOSIS — F329 Major depressive disorder, single episode, unspecified: Secondary | ICD-10-CM

## 2012-07-09 DIAGNOSIS — E782 Mixed hyperlipidemia: Secondary | ICD-10-CM | POA: Insufficient documentation

## 2012-07-09 MED ORDER — CHLORPROMAZINE HCL 25 MG PO TABS: 25.0000 mg | ORAL_TABLET | Freq: Three times a day (TID) | ORAL | Status: DC

## 2012-07-09 MED ORDER — DULOXETINE HCL 60 MG PO CPEP: 60.0000 mg | ORAL_CAPSULE | Freq: Every day | ORAL | Status: DC

## 2012-07-09 NOTE — Progress Notes (Signed)
   [facility] Follow-up Outpatient Visit  Aimee Reed 17-Jun-1956  Date:    Subjective: "These eyelids are driving me crazy"  Filed Vitals:   07/09/12 1111  BP: 142/100    Mental Status Examination  Appearance: anxious Alert: Yes Attention: good  Cooperative: Yes Eye Contact: Minimal, with blephrospasm Speech: natural conversational  Psychomotor Activity: Normal Memory/Concentration: is wondering if she will have alzheimer's disease Oriented: person, place and time/date Mood: Anxious and Depressed Affect: Appropriate Thought Processes and Associations: Coherent Fund of Knowledge: Good Thought Content: no SI/HI/ A/VH Insight: Good Judgement: Good  Diagnosis: Depression and Anxiety   Treatment Plan: Shift to Thorazine for anxiety instead of Xanax.  Orson Aloe, MD

## 2012-08-05 ENCOUNTER — Encounter (HOSPITAL_COMMUNITY): Payer: Self-pay | Admitting: Psychiatry

## 2012-08-05 ENCOUNTER — Ambulatory Visit (INDEPENDENT_AMBULATORY_CARE_PROVIDER_SITE_OTHER): Payer: Medicare Other | Admitting: Psychiatry

## 2012-08-05 VITALS — Wt 253.4 lb

## 2012-08-05 DIAGNOSIS — F329 Major depressive disorder, single episode, unspecified: Secondary | ICD-10-CM

## 2012-08-05 DIAGNOSIS — F32A Depression, unspecified: Secondary | ICD-10-CM | POA: Insufficient documentation

## 2012-08-05 DIAGNOSIS — F331 Major depressive disorder, recurrent, moderate: Secondary | ICD-10-CM

## 2012-08-05 DIAGNOSIS — F419 Anxiety disorder, unspecified: Secondary | ICD-10-CM

## 2012-08-05 DIAGNOSIS — F411 Generalized anxiety disorder: Secondary | ICD-10-CM

## 2012-08-05 MED ORDER — CHLORPROMAZINE HCL 25 MG PO TABS: 12.5000 mg | ORAL_TABLET | Freq: Three times a day (TID) | ORAL | Status: DC

## 2012-08-05 MED ORDER — PROPRANOLOL HCL 10 MG PO TABS: 5.0000 mg | ORAL_TABLET | Freq: Three times a day (TID) | ORAL | Status: DC

## 2012-08-05 MED ORDER — DULOXETINE HCL 60 MG PO CPEP: 60.0000 mg | ORAL_CAPSULE | Freq: Every day | ORAL | Status: DC

## 2012-08-05 NOTE — Patient Instructions (Signed)
Kundilini Yoga may be helpful.

## 2012-08-05 NOTE — Progress Notes (Signed)
[facility] Follow-up Outpatient Visit  Aimee Reed 31-Jul-1956  Date: 08/05/2012 11:20 AM  Subjective: "These eyelids are still driving me crazy. My humming is worse.  I can relax enough during the day about 2000 to see the TV.  I can tolerate about 2 Thorazine a day, any more and I get sluggish and don't feel right."  Husband notes that her leg jogging is quite prominent.  She does not realize that she is doing it.  Wt 253 lb 6.4 oz (114.941 kg)  Mental Status Examination  Appearance: anxious Alert: Yes Attention: good  Cooperative: Yes Eye Contact: Minimal, with blephrospasm Speech: natural conversational  Psychomotor Activity: Normal Memory/Concentration: is wondering if she will have alzheimer's disease Oriented: person, place and time/date Mood: Anxious and Depressed Affect: Appropriate Thought Processes and Associations: Coherent Fund of Knowledge: Good Thought Content: no SI/HI/ A/VH Insight: Good Judgement: Good  Diagnosis: Depression and Anxiety   Treatment Plan:  Continue Thorazine with half pills Add Inderal with half pills Use breathing relaxation 3 times a day and extra time at bed time. Explore Kundilini Yoga as a method to learn meditation. See orders and pt instructions for more details.  Orson Aloe, MD

## 2012-09-17 ENCOUNTER — Ambulatory Visit (INDEPENDENT_AMBULATORY_CARE_PROVIDER_SITE_OTHER): Payer: Medicare Other | Admitting: Psychiatry

## 2012-09-17 ENCOUNTER — Encounter (HOSPITAL_COMMUNITY): Payer: Self-pay | Admitting: Psychiatry

## 2012-09-17 DIAGNOSIS — F329 Major depressive disorder, single episode, unspecified: Secondary | ICD-10-CM

## 2012-09-17 DIAGNOSIS — F341 Dysthymic disorder: Secondary | ICD-10-CM

## 2012-09-17 NOTE — Patient Instructions (Addendum)
Keep exploring the Kundilini Yoga.  Keep relaxing with WARM tub baths and bubble baths as well as using essential oils and incense.

## 2012-09-17 NOTE — Progress Notes (Signed)
[facility] Follow-up Outpatient Visit  Aimee Reed DOB: 1955-09-23 Age: 57 y.o.  Date: 09/17/2012 Start Time: 10:45 AM  Chief Complaint: Chief Complaint  Patient presents with  . Anxiety  . Depression  . Follow-up  . Medication Refill   Depression 2/10 and Anxiety 2 or 3/10, where 1 is the best and 10 is the worst.  Subjective: "These eyelids are still driving me crazy. I have ben doing the breathing exercise.  My seeing eye person (her husband) has been running her into walls and signs". She got shots in both eyes yesterday and doubled the dose.  Hopefully this will help. She is noting that when things upset her the upsetness doesn't last as long.  This has been the result of both the small doses of medications and the breathing.   Pt reports that she is compliant with the psychotropic medications with fair benefit and no noticeable side effects.  Medications: Thorazine 25 mg half a pill twice a day Cymbalta 60 mg in the morning Inderal 10 mg twice a day  Vitals: There were no vitals taken for this visit. Pt refused and claimed that she did not gain any weight over the holidays.  Mental Status Examination  Appearance: anxious Alert: Yes Attention: good  Cooperative: Yes Eye Contact: Minimal, with blephrospasm Speech: natural conversational  Psychomotor Activity: Normal Memory/Concentration: is wondering if she will have alzheimer's disease Oriented: person, place and time/date Mood: Anxious and Depressed Affect: Appropriate Thought Processes and Associations: Coherent Fund of Knowledge: Good Thought Content: no SI/HI/ A/VH Insight: Good Judgement: Good  Diagnosis: Depression and Anxiety   Treatment Plan:  I took her vitals.  I reviewed CC, tobacco/med/surg Hx, meds effects/ side effects, problem list, therapies and responses as well as current situation/symptoms discussed options. See orders and pt instructions for more details.  Orson Aloe,  MD End Time: 11:04 AM

## 2012-10-26 ENCOUNTER — Other Ambulatory Visit (HOSPITAL_COMMUNITY): Payer: Self-pay | Admitting: Psychiatry

## 2012-10-26 DIAGNOSIS — F419 Anxiety disorder, unspecified: Secondary | ICD-10-CM

## 2012-10-28 NOTE — Telephone Encounter (Signed)
Refill request approved via eScripts.  

## 2012-10-30 ENCOUNTER — Ambulatory Visit (INDEPENDENT_AMBULATORY_CARE_PROVIDER_SITE_OTHER): Payer: Medicare Other | Admitting: Psychiatry

## 2012-10-30 ENCOUNTER — Encounter (HOSPITAL_COMMUNITY): Payer: Self-pay | Admitting: Psychiatry

## 2012-10-30 VITALS — Wt 253.2 lb

## 2012-10-30 DIAGNOSIS — F329 Major depressive disorder, single episode, unspecified: Secondary | ICD-10-CM

## 2012-10-30 DIAGNOSIS — F419 Anxiety disorder, unspecified: Secondary | ICD-10-CM

## 2012-10-30 MED ORDER — DULOXETINE HCL 60 MG PO CPEP: 60.0000 mg | ORAL_CAPSULE | Freq: Every day | ORAL | Status: DC

## 2012-10-30 MED ORDER — PROPRANOLOL HCL 10 MG PO TABS: ORAL_TABLET | ORAL | Status: DC

## 2012-10-30 MED ORDER — CHLORPROMAZINE HCL 25 MG PO TABS: 12.5000 mg | ORAL_TABLET | Freq: Three times a day (TID) | ORAL | Status: DC

## 2012-10-30 NOTE — Patient Instructions (Signed)
"  Native Wisdom for White Minds" by Anne Wilson Schaef could be very helpful.  Call if problems or concerns.  

## 2012-10-30 NOTE — Progress Notes (Signed)
[facility] [id number] Progress Note Aimee Reed MRN: [id number] DOB: 08/27/1956 Age: 57 y.o.  Date: 10/30/2012 Start Time: 11:30 AM End Time: 11:45 AM  Chief Complaint: Chief Complaint  Patient presents with  . Anxiety  . Depression  . Follow-up  . Medication Refill   Chief Complaint: Chief Complaint  Patient presents with  . Anxiety  . Depression  . Follow-up  . Medication Refill   Depression 2 or 3/10 and Anxiety 8 or 9/10, where 1 is the best and 10 is the worst.  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  Subjective:  "These eyelids are still driving me crazy. I have been doing the breathing exercise with variable beneift".  She got shots in both eyes in January and doubled the dose and it helped only for a couple of days. She listens to books on tape and music and that puts her to sleep.  Pt reports that she is compliant with the psychotropic medications with poor to fair benefit and no noticeable side effects.  She wishes to try higher doses of both the Thraozine and Inderal.  I have given her full privileges to do so.   Medications: Thorazine 25 mg half a pill twice a day Cymbalta 60 mg in the morning Inderal 10 mg twice a day  Vitals: Wt 253 lb 3.2 oz (114.851 kg)  BMI 44.14 kg/m2  Mental Status Examination  Appearance: anxious Alert: Yes Attention: good  Cooperative: Yes Eye Contact: Minimal, with blephrospasm Speech: natural conversational  Psychomotor Activity: Normal Memory/Concentration: is wondering if she will have alzheimer's disease Oriented: person, place and time/date Mood: Anxious and Depressed Affect: Appropriate Thought Processes and Associations: Coherent Fund of Knowledge: Good Thought Content: no SI/HI/ A/VH Insight: Good Judgement: Good  Lab Results: No results found for this or any previous visit (from the past 8736 hour(s)). PCP orders and follows her labs.  No concerns are noted.  A1c was 6.4.  Diagnosis: Depression  and Anxiety   Plan: I took her vitals.  I reviewed CC, tobacco/med/surg Hx, meds effects/ side effects, problem list, therapies and responses as well as current situation/symptoms discussed options. See orders and pt instructions for more details.  Medical Decision Making Problem Points:  Established problem, worsening (2), Review of last therapy session (1) and Review of psycho-social stressors (1) Data Points:  Review or order clinical lab tests (1) Review of medication regiment & side effects (2) Review of new medications or change in dosage (2)  I certify that outpatient services furnished can reasonably be expected to improve the patient's condition.   Orson Aloe, MD, [facility]

## 2012-11-27 ENCOUNTER — Ambulatory Visit (INDEPENDENT_AMBULATORY_CARE_PROVIDER_SITE_OTHER): Payer: Medicare Other | Admitting: Psychiatry

## 2012-11-27 ENCOUNTER — Encounter (HOSPITAL_COMMUNITY): Payer: Self-pay | Admitting: Psychiatry

## 2012-11-27 VITALS — Wt 256.0 lb

## 2012-11-27 DIAGNOSIS — F411 Generalized anxiety disorder: Secondary | ICD-10-CM

## 2012-11-27 MED ORDER — PROPRANOLOL HCL 10 MG PO TABS: ORAL_TABLET | ORAL | Status: DC

## 2012-11-27 MED ORDER — CHLORPROMAZINE HCL 25 MG PO TABS: 12.5000 mg | ORAL_TABLET | Freq: Three times a day (TID) | ORAL | Status: DC

## 2012-11-27 MED ORDER — DULOXETINE HCL 60 MG PO CPEP: 60.0000 mg | ORAL_CAPSULE | Freq: Every day | ORAL | Status: DC

## 2012-11-27 NOTE — Progress Notes (Signed)
[facility] [id number] Progress Note Aimee Reed MRN: [id number] DOB: April 18, 1956 Age: 57 y.o.  Date: 11/27/2012 Start Time: 11:40 AM End Time: 12:13 PM  Chief Complaint: Chief Complaint  Patient presents with  . Anxiety  . Depression  . Follow-up  . Medication Refill   Depression 6 or 7/10 mainly because of eyes generally in the range of 4 and 8 and Anxiety 3/10 and generally in the range of 0 to 6 , where 1 is the best and 10 is the worst.  Pain 0/10,  Blephrospasm is 10/10 today and generally stays in the range of 10 to 15/10  Subjective:  "These eyelids are driving me completely crazy".  She now has trouble seeing in the dark and can't open her eyes at all.  She returns for follow up appointment with her husband.  Pt reports that she is compliant with the psychotropic medications with fair benefit and no noticeable side effects.  The Thorazine has been associated with her falling asleep during the day more.  She is fine with that.  She is either listening to a book or listening to music.  Discussed her noting anxiety relief from the AM Inderal.  Encouraged her to try a mid day dose.  Also disclosed info about [employer].  Medications: Thorazine 25 mg twice a day Cymbalta 60 mg in the morning Inderal 10 mg 2 in AM and 1 at night  Vitals: Wt 256 lb (116.121 kg)  BMI 44.63 kg/m2  Mental Status Examination  Appearance: anxious Alert: Yes Attention: good  Cooperative: Yes Eye Contact: Minimal, with blephrospasm Speech: natural conversational  Psychomotor Activity: Normal Memory/Concentration: is wondering if she will have alzheimer's disease Oriented: person, place and time/date Mood: Anxious and Depressed Affect: Appropriate Thought Processes and Associations: Coherent Fund of Knowledge: Good Thought Content: no SI/HI/ A/VH Insight: Good Judgement: Good  Lab Results: No results found for this or any previous visit (from the past 8736  hour(s)). PCP orders and follows her labs.  No concerns are noted.  A1c was 6.4.  Diagnosis: Depression and Anxiety   Plan: I took her vitals.  I reviewed CC, tobacco/med/surg Hx, meds effects/ side effects, problem list, therapies and responses as well as current situation/symptoms discussed options. Add mid day dose of Inderal.  Give medical statement for [employer]. See orders and pt instructions for more details.  Medical Decision Making Problem Points:  Established problem, worsening (2), Review of last therapy session (1) and Review of psycho-social stressors (1) Data Points:  Review or order clinical lab tests (1) Review of medication regiment & side effects (2) Review of new medications or change in dosage (2)  I certify that outpatient services furnished can reasonably be expected to improve the patient's condition.   Orson Aloe, MD, [facility]

## 2012-11-27 NOTE — Patient Instructions (Addendum)
Look into Books [employer] the Blind on the Internet.  "A week in winter" is an awesome book about the [address] of [place].  "I am Wishes Fulfilled Meditation" by Marylene Buerger and Lyndal Pulley may be helpful for meditation  Relaxation is the ultimate solution for you.  You can seek it through tub baths, bubble baths, essential oils or incense, walking or chatting with friends, listening to soft music, watching a candle burn and just letting all thoughts go and appreciating the true essence of the Creator.   Yoga is a very helpful exercise method.  On TV, on line, or by DVD Adelfa Koh is a source of high quality information about yoga and videos on yoga.  Renee Ramus is the world's number one video yoga instructor according to some experts.  There are exceptional health benefits that can be achieved through yoga.  The main principles of yoga is acceptance, no competition, no comparison, and no judgement.  It is exceptional in helping people meditate and get to a very relaxed state.   "Native Wisdom for [employer] by Camelia Phenes could be very helpful.  I am the treating psychiatrist for Ms Aimee Reed.  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  She in my medical opinion qualifies for book for the blind.  Please allow her to use the Geneticist, molecular for [employer].  Dorian Heckle Dan Humphreys, MD, [facility] Certified in Child/Adolescent Psychiatry and Psychiatry

## 2013-01-28 ENCOUNTER — Ambulatory Visit (INDEPENDENT_AMBULATORY_CARE_PROVIDER_SITE_OTHER): Payer: Medicare Other | Admitting: Psychiatry

## 2013-01-28 ENCOUNTER — Encounter (HOSPITAL_COMMUNITY): Payer: Self-pay | Admitting: Psychiatry

## 2013-01-28 VITALS — BP 132/86 | Wt 258.4 lb

## 2013-01-28 DIAGNOSIS — F411 Generalized anxiety disorder: Secondary | ICD-10-CM

## 2013-01-28 DIAGNOSIS — F329 Major depressive disorder, single episode, unspecified: Secondary | ICD-10-CM

## 2013-01-28 DIAGNOSIS — F331 Major depressive disorder, recurrent, moderate: Secondary | ICD-10-CM

## 2013-01-28 MED ORDER — DULOXETINE HCL 60 MG PO CPEP: 60.0000 mg | ORAL_CAPSULE | Freq: Every day | ORAL | Status: DC

## 2013-01-28 MED ORDER — PROPRANOLOL HCL 10 MG PO TABS: ORAL_TABLET | ORAL | Status: DC

## 2013-01-28 NOTE — Progress Notes (Signed)
[facility] [id number] Progress Note Aimee Reed MRN: [id number] DOB: 1956/03/08 Age: 57 y.o.  Date: 01/28/2013 Start Time: 10:59 AM End Time: 11:24 AM  Chief Complaint: Chief Complaint  Patient presents with  . Anxiety  . Depression  . Follow-up  . Medication Refill   Depression 3/10 mainly because of eyes generally in the range of 5 to 7 and Anxiety 5 when form filled out and 8 by the time she got back to the office/10 and generally in the range of 5 to 7, where 1 is the best and 10 is the worst.  Pain 0/10,  Blephrospasm is 10/10 today and generally stays in the range of 10 to 15/10  Subjective:  "These eyelids are still driving me completely crazy".  She now has trouble seeing in the dark and can't open her eyes at all.  She returns for follow up appointment with her husband.  Pt reports that she is compliant with the psychotropic medications with good benefit and no noticeable side effects.  Still falling asleep during the day with the Thorazine.  Ophthalmologist wants to shift her to Klonopin from Thorazine.  Will discuss with the doctor per pt request.   Signed form for [employer] for her.  Medications: Thorazine 25 mg twice a day Cymbalta 60 mg in the morning Inderal 10 mg 2 in AM and 1 at night  Vitals: BP 132/86  Wt 258 lb 6.4 oz (117.209 kg)  BMI 45.05 kg/m2  Mental Status Examination  Appearance: anxious Alert: Yes Attention: good  Cooperative: Yes Eye Contact: Minimal, with blephrospasm Speech: natural conversational  Psychomotor Activity: Normal Memory/Concentration: is wondering if she will have alzheimer's disease Oriented: person, place and time/date Mood: Anxious and Depressed Affect: Appropriate Thought Processes and Associations: Coherent Fund of Knowledge: Good Thought Content: no SI/HI/ A/VH Insight: Good Judgement: Good  Lab Results: No results found for this or any previous visit (from the past 8736 hour(s)). PCP  orders and follows her labs.  No concerns are noted.  A1c was 6.4.  Diagnosis: Depression and Anxiety   Plan: I took her vitals.  I reviewed CC, tobacco/med/surg Hx, meds effects/ side effects, problem list, therapies and responses as well as current situation/symptoms discussed options. Continue current effective medications.Discuss with Dr Carilyn Goodpasture the addition of Klonopin. See orders and pt instructions for more details.  MEDICATIONS this encounter: Meds ordered this encounter  Medications  . DULoxetine (CYMBALTA) 60 MG capsule    Sig: Take 1 capsule (60 mg total) by mouth daily.    Dispense:  30 capsule    Refill:  3  . propranolol (INDERAL) 10 MG tablet    Sig: Take 1/2 to 2 whole pills 90 minutes before traveling or other anticipated anxiety as well as up to 5 a day.    Dispense:  150 tablet    Refill:  0   Medical Decision Making Problem Points:  Established problem, worsening (2), Review of last therapy session (1) and Review of psycho-social stressors (1) Data Points:  Review or order clinical lab tests (1) Review of medication regiment & side effects (2) Review of new medications or change in dosage (2)  I certify that outpatient services furnished can reasonably be expected to improve the patient's condition.   Orson Aloe, MD, [facility]

## 2013-01-28 NOTE — Addendum Note (Signed)
Addended by: Mike Craze on: 01/28/2013 04:54 PM   Modules accepted: Orders

## 2013-01-28 NOTE — Patient Instructions (Signed)
Try hypnotherapy for the management of the blephrospasm.  Joeseph Amor number is [phone]  I will call Dr Carilyn Goodpasture to discuss the Thorazine with Klonopin  Take care of yourself.  No one else is standing up to do the job and only you know what you need.   "I am Wishes Fulfilled Meditation" by Marylene Buerger and Lyndal Pulley may be helpful MUSIC for getting to sleep or for meditating You can order it from on line.  You might find the Chill channel on Pandora and explore the artists that you like better.   GET SERIOUS about taking care of yourself.  Do the next right thing and that often means doing something to care for yourself along the lines of are you hungry, are you angry, are you lonely, are you tired, are you scared?  HALTS is what that stands for.  Call if problems or concerns.

## 2013-01-31 MED ORDER — CHLORPROMAZINE HCL 10 MG PO TABS: 10.0000 mg | ORAL_TABLET | Freq: Three times a day (TID) | ORAL | Status: DC

## 2013-01-31 NOTE — Addendum Note (Signed)
Addended by: Mike Craze on: 01/31/2013 09:37 PM   Modules accepted: Orders

## 2013-02-04 ENCOUNTER — Encounter: Payer: Self-pay | Admitting: Internal Medicine

## 2013-02-05 ENCOUNTER — Encounter: Payer: Self-pay | Admitting: Gastroenterology

## 2013-02-05 ENCOUNTER — Ambulatory Visit (INDEPENDENT_AMBULATORY_CARE_PROVIDER_SITE_OTHER): Payer: Medicare Other | Admitting: Gastroenterology

## 2013-02-05 VITALS — BP 126/72 | HR 65 | Temp 97.6°F | Ht 64.0 in | Wt 259.0 lb

## 2013-02-05 DIAGNOSIS — K219 Gastro-esophageal reflux disease without esophagitis: Secondary | ICD-10-CM

## 2013-02-05 DIAGNOSIS — Z860100 Personal history of colon polyps, unspecified: Secondary | ICD-10-CM | POA: Insufficient documentation

## 2013-02-05 DIAGNOSIS — R1314 Dysphagia, pharyngoesophageal phase: Secondary | ICD-10-CM

## 2013-02-05 DIAGNOSIS — R1310 Dysphagia, unspecified: Secondary | ICD-10-CM

## 2013-02-05 NOTE — Progress Notes (Signed)
Primary Care Physician:  GOLDING, JOHN CABOT, MD  Primary Gastroenterologist:  Michael Rourk, MD   Chief Complaint  Patient presents with  . Dysphagia    HPI:  Aimee Reed is a 57 y.o. female here for complaints of dysphagia. 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 Patient feels like food sticking in throat. Pills, dry breads, solid foods. Liquids ok. Previously helped with dilation. Increased omeprazole to 40mg BID two weeks ago and heartburn better. First thing in the morning has lots of fecal urgency. Started fiber every day and helps some. Flavored coffees upsets stomach. Other times may go 2-3 days without BM. When finally goes stools large/soft. No melena, brbpr. Going on for more than one year. Last colonoscopy reportedly about five years ago at [facility], h/o colon polyps per patient. We have contacted Dr. Jenkins office and there is no records. Awaiting search at [facility].   Current Outpatient Prescriptions  Medication Sig Dispense Refill  . amLODipine (NORVASC) 5 MG tablet Take 5 mg by mouth daily.        . aspirin EC 81 MG tablet Take 81 mg by mouth daily.        . Biotin 5000 MCG TABS Take 5,000 mcg by mouth daily.      . DULoxetine (CYMBALTA) 60 MG capsule Take 1 capsule (60 mg total) by mouth daily.  30 capsule  3  . EVENING PRIMROSE OIL PO Take 1,000 mg by mouth 2 (two) times daily.       . loratadine (CLARITIN) 10 MG tablet Take 10 mg by mouth daily.      . metFORMIN (GLUCOPHAGE) 1000 MG tablet Take 1,000 mg by mouth 2 (two) times daily with a meal.       . Nutritional Supplements (ESTROVEN PO) Take 1 tablet by mouth daily.       . propranolol (INDERAL) 10 MG tablet Take 1/2 to 2 whole pills 90 minutes before traveling or other anticipated anxiety as well as up to 5 a day.  150 tablet  0  . simvastatin (ZOCOR) 20 MG tablet Take 20 mg by mouth at bedtime.       . omeprazole (PRILOSEC) 40 MG capsule 40 mg 2 (two) times daily.       No current  facility-administered medications for this visit.    Allergies as of 02/05/2013  . (No Known Allergies)    Past Medical History  Diagnosis Date  . GERD (gastroesophageal reflux disease)   . Irritable bowel syndrome   . Overactive bladder   . HTN (hypertension)   . Recurrent UTI     chronic macrodantin  . Blepharospasm   . Anxiety   . Depression   . Diabetes mellitus, type II   . Hyperlipidemia 03/11/2012    Past Surgical History  Procedure Laterality Date  . Cholecystectomy    . Carpal tunnel release      both hands  . Colonoscopy      Dr. Jenkins, around 2010  . Esophagogastroduodenoscopy  07/31/2011    RMR:Occult cervical esophageal web s/p dilation,query short segment Barrett's esophagus s/p bx (Barrett's esophagus without dysplasia),small hiatal hernia. 56 French Maloney dilator.    Family History  Problem Relation Age of Onset  . Depression Father   . Colon cancer Neg Hx   . Liver disease Neg Hx   . ADD / ADHD Neg Hx   . Alcohol abuse Neg Hx   . Drug abuse Neg Hx   .   Anxiety disorder Neg Hx   . OCD Neg Hx   . Bipolar disorder Neg Hx   . Paranoid behavior Neg Hx   . Schizophrenia Neg Hx   . Seizures Neg Hx   . Sexual abuse Neg Hx   . Physical abuse Neg Hx   . Ulcers Mother   . Alzheimer's disease Mother   . Dementia Mother   . Alzheimer's disease Maternal Grandmother   . Dementia Maternal Grandmother     History   Social History  . Marital Status: Married    Spouse Name: N/A    Number of Children: 2  . Years of Education: N/A   Occupational History  . unemployed    Social History Main Topics  . Smoking status: Never Smoker   . Smokeless tobacco: Not on file  . Alcohol Use: No  . Drug Use: No  . Sexually Active: Not on file   Other Topics Concern  . Not on file   Social History Narrative  . No narrative on file      ROS:  General: Negative for anorexia, weight loss, fever, chills, fatigue, weakness. Eyes: Negative for vision  changes.  ENT: Negative for hoarseness, nasal congestion. CV: Negative for chest pain, angina, palpitations, dyspnea on exertion, peripheral edema.  Respiratory: Negative for dyspnea at rest, dyspnea on exertion, cough, sputum, wheezing.  GI: See history of present illness. GU:  Negative for dysuria, hematuria, urinary incontinence, urinary frequency, nocturnal urination.  MS: Negative for joint pain, low back pain.  Derm: Negative for rash or itching.  Neuro: Negative for weakness, abnormal sensation, seizure, frequent headaches, memory loss, confusion.  Psych: Negative for anxiety, depression, suicidal ideation, hallucinations.  Endo: Negative for unusual weight change.  Heme: Negative for bruising or bleeding. Allergy: Negative for rash or hives.    Physical Examination:  BP 126/72  Pulse 65  Temp(Src) 97.6 F (36.4 C) (Oral)  Ht 5' 4" (1.626 m)  Wt 259 lb (117.482 kg)  BMI 44.44 kg/m2   General: Well-nourished, well-developed in no acute distress. Accompanied by spouse. Head: Normocephalic, atraumatic.   Eyes: Conjunctiva pink, no icterus. Mouth: Oropharyngeal mucosa moist and pink , no lesions erythema or exudate. Neck: Supple without thyromegaly, masses, or lymphadenopathy.  Lungs: Clear to auscultation bilaterally.  Heart: Regular rate and rhythm, no murmurs rubs or gallops.  Abdomen: Bowel sounds are normal, nontender, nondistended, no hepatosplenomegaly or masses, no abdominal bruits or    hernia , no rebound or guarding.   Rectal: defer Extremities: No lower extremity edema. No clubbing or deformities.  Neuro: Alert and oriented x 4 , grossly normal neurologically.  Skin: Warm and dry, no rash or jaundice.   Psych: Alert and cooperative, normal mood and affect.    

## 2013-02-05 NOTE — Assessment & Plan Note (Signed)
Solid food esophageal dysphagia with h/o prior cervical esophageal web/Barrett's esophagus. Prior EGD/ED with good response/resolution of dyphagia. EGD/ED in the near future.  I have discussed the risks, alternatives, benefits with regards to but not limited to the risk of reaction to medication, bleeding, infection, perforation and the patient is agreeable to proceed. Written consent to be obtained.  Continue omeprazole 40mg  BID for now.

## 2013-02-05 NOTE — Patient Instructions (Addendum)
1. Continue Prilosec twice daily. 2. Once we've reviewed your records or prior colonoscopy, we will make decision whether you due at this time. At that point we will schedule you for an upper endoscopy for your swallowing difficulties as well. 3. Please chew your food thoroughly and use plenty of liquids to help flush food down. 4. Please call with any questions or concerns.

## 2013-02-05 NOTE — Assessment & Plan Note (Signed)
She gives h/o colonic polyps on prior colonoscopy five years ago. We have requested records from [facility] and Dr. Lovell Sheehan. No prior colonoscopy by Dr. Lovell Sheehan per his office. Await [facility] records. If none available, proceed with colonoscopy for h/o colon polyps and bowel issues.

## 2013-02-06 NOTE — Progress Notes (Signed)
Cc PCP 

## 2013-02-10 ENCOUNTER — Telehealth: Payer: Self-pay | Admitting: Gastroenterology

## 2013-02-10 NOTE — Telephone Encounter (Signed)
Please let patient know we searched for record of her prior colonoscopy at Dr. Lovell Sheehan and [facility]. Could not find record. If she had h/o polyps, and she states she was about five years out from her last one, I would recommend she have TCS at time of her EGD.  If patient agreeable, schedule EGD/TCS with Dr. Jena Gauss. Day of bowel prep: metformin 500mg  BID.

## 2013-02-12 ENCOUNTER — Other Ambulatory Visit: Payer: Self-pay | Admitting: Internal Medicine

## 2013-02-12 DIAGNOSIS — R1319 Other dysphagia: Secondary | ICD-10-CM

## 2013-02-12 DIAGNOSIS — Z86010 Personal history of colonic polyps: Secondary | ICD-10-CM

## 2013-02-12 MED ORDER — PEG 3350-KCL-NA BICARB-NACL 420 G PO SOLR: 4000.0000 mL | ORAL | Status: DC

## 2013-02-12 NOTE — Telephone Encounter (Signed)
Tried to call pt- left detailed message on voicemail

## 2013-02-12 NOTE — Telephone Encounter (Signed)
Patient is scheduled for TCS/EGD w/RMR on 06/17 @ 9:30 instructions have been mailed

## 2013-02-13 ENCOUNTER — Encounter (HOSPITAL_COMMUNITY): Payer: Self-pay | Admitting: Pharmacy Technician

## 2013-02-24 ENCOUNTER — Encounter (HOSPITAL_COMMUNITY): Payer: Self-pay | Admitting: Psychiatry

## 2013-02-24 ENCOUNTER — Ambulatory Visit (INDEPENDENT_AMBULATORY_CARE_PROVIDER_SITE_OTHER): Payer: Medicare Other | Admitting: Psychiatry

## 2013-02-24 ENCOUNTER — Other Ambulatory Visit: Payer: Self-pay | Admitting: Internal Medicine

## 2013-02-24 ENCOUNTER — Encounter (HOSPITAL_COMMUNITY): Payer: Self-pay | Admitting: Pharmacy Technician

## 2013-02-24 VITALS — BP 132/80 | Ht 63.5 in | Wt 256.4 lb

## 2013-02-24 DIAGNOSIS — F331 Major depressive disorder, recurrent, moderate: Secondary | ICD-10-CM

## 2013-02-24 DIAGNOSIS — K219 Gastro-esophageal reflux disease without esophagitis: Secondary | ICD-10-CM

## 2013-02-24 DIAGNOSIS — Z860100 Personal history of colon polyps, unspecified: Secondary | ICD-10-CM

## 2013-02-24 DIAGNOSIS — F419 Anxiety disorder, unspecified: Secondary | ICD-10-CM

## 2013-02-24 DIAGNOSIS — R1319 Other dysphagia: Secondary | ICD-10-CM

## 2013-02-24 DIAGNOSIS — F341 Dysthymic disorder: Secondary | ICD-10-CM

## 2013-02-24 MED ORDER — DULOXETINE HCL 60 MG PO CPEP: 60.0000 mg | ORAL_CAPSULE | Freq: Every day | ORAL | Status: DC

## 2013-02-24 MED ORDER — PROPRANOLOL HCL 10 MG PO TABS: 10.0000 mg | ORAL_TABLET | Freq: Two times a day (BID) | ORAL | Status: DC

## 2013-02-24 NOTE — Patient Instructions (Signed)
Keep the stress handling things going.  Call if problems or concerns.

## 2013-02-24 NOTE — Progress Notes (Signed)
[facility] [id number] Progress Note Aimee Reed MRN: [id number] DOB: 08-Oct-1955 Age: 57 y.o.  Date: 02/24/2013 Start Time: 11:40 AM End Time: 11:59 AM  Chief Complaint: Chief Complaint  Patient presents with  . Anxiety  . Depression  . Follow-up  . Medication Refill   Depression 5/10  and Anxiety 7/10, where 1 is the best and 10 is the worst.  Pain 0/10,  Blephrospasm is 8/10 today and generally stays in the range of 10 to 15/10  Subjective:  "I'm feeling okay today, got colonoscopy and upper endoscopy in AM.  That is what is driving my anxiety today".  Today I see her eyes for the first time.  She now has trouble seeing in the dark and can't open her eyes at all.  She returns for follow up appointment with her husband.  Pt reports that she is compliant with the psychotropic medications with good benefit and no noticeable side effects.  Off the Thorazine now.  Ophthalmologist wants to shift her to Klonopin from Thorazine.  Will discuss with the doctor per pt request.   Signed form more correctly for [employer] for her today.  Medications: Cymbalta 60 mg in the morning Inderal 10 mg 2 in AM and 1 at night  Vitals: BP 132/80  Ht 5' 3.5" (1.613 m)  Wt 256 lb 6.4 oz (116.302 kg)  BMI 44.7 kg/m2  Allergies: No Known Allergies Medical History: Past Medical History  Diagnosis Date  . GERD (gastroesophageal reflux disease)   . Irritable bowel syndrome   . Overactive bladder   . HTN (hypertension)   . Recurrent UTI     chronic macrodantin  . Blepharospasm   . Anxiety   . Depression   . Diabetes mellitus, type II   . Hyperlipidemia 03/11/2012   Surgical History: Past Surgical History  Procedure Laterality Date  . Cholecystectomy    . Carpal tunnel release      both hands  . Colonoscopy      Dr. Lovell Sheehan, around 2010  . Esophagogastroduodenoscopy  07/31/2011    AOZ:HYQMVH cervical esophageal web s/p dilation,query short segment Barrett's esophagus  s/p bx (Barrett's esophagus without dysplasia),small hiatal hernia. 56 French Maloney dilator.   Family History: family history includes Alzheimer's disease in her maternal grandmother and mother; Dementia in her maternal grandmother and mother; Depression in her father; and Ulcers in her mother.  There is no history of Colon cancer, and Liver disease, and ADD / ADHD, and Alcohol abuse, and Drug abuse, and Anxiety disorder, and OCD, and Bipolar disorder, and Paranoid behavior, and Schizophrenia, and Seizures, and Sexual abuse, and Physical abuse, . Reviewed this today and nothing is new.  Mental Status Examination  Appearance: anxious Alert: Yes Attention: good  Cooperative: Yes Eye Contact: Minimal, with blephrospasm Speech: natural conversational  Psychomotor Activity: Normal Memory/Concentration: is wondering if she will have alzheimer's disease Oriented: person, place and time/date Mood: Anxious and Depressed Affect: Appropriate Thought Processes and Associations: Coherent Fund of Knowledge: Good Thought Content: no SI/HI/ A/VH Insight: Good Judgement: Good  Lab Results: No results found for this or any previous visit (from the past 8736 hour(s)). PCP orders and follows her labs.  No concerns are noted.  A1c was 6.4.  Diagnosis: Depression and Anxiety   Plan: I took her vitals.  I reviewed CC, tobacco/med/surg Hx, meds effects/ side effects, problem list, therapies and responses as well as current situation/symptoms discussed options. Continue current effective medications.Discuss with Dr Carilyn Goodpasture the  addition of Klonopin. See orders and pt instructions for more details.  MEDICATIONS this encounter: No orders of the defined types were placed in this encounter.   Medical Decision Making Problem Points:  Established problem, worsening (2), Review of last therapy session (1) and Review of psycho-social stressors (1) Data Points:  Review or order clinical lab tests (1) Review of  medication regiment & side effects (2) Review of new medications or change in dosage (2)  I certify that outpatient services furnished can reasonably be expected to improve the patient's condition.   Orson Aloe, MD, [facility]

## 2013-02-25 ENCOUNTER — Ambulatory Visit (HOSPITAL_COMMUNITY)
Admission: RE | Admit: 2013-02-25 | Discharge: 2013-02-25 | Disposition: A | Discharge status: Home / Self Care | Payer: Medicare Other | Source: Ambulatory Visit | Attending: Internal Medicine | Admitting: Internal Medicine

## 2013-02-25 ENCOUNTER — Ambulatory Visit (HOSPITAL_COMMUNITY): Admission: RE | Admit: 2013-02-25 | Payer: Medicare Other | Source: Ambulatory Visit | Admitting: Internal Medicine

## 2013-02-25 ENCOUNTER — Encounter (HOSPITAL_COMMUNITY): Payer: Self-pay | Admitting: *Deleted

## 2013-02-25 ENCOUNTER — Encounter (HOSPITAL_COMMUNITY): Admission: RE | Payer: Self-pay | Source: Ambulatory Visit

## 2013-02-25 ENCOUNTER — Encounter (HOSPITAL_COMMUNITY)
Admission: RE | Disposition: A | Discharge status: Home / Self Care | Payer: Self-pay | Source: Ambulatory Visit | Attending: Internal Medicine

## 2013-02-25 DIAGNOSIS — R1310 Dysphagia, unspecified: Secondary | ICD-10-CM | POA: Insufficient documentation

## 2013-02-25 DIAGNOSIS — R1319 Other dysphagia: Secondary | ICD-10-CM

## 2013-02-25 DIAGNOSIS — D126 Benign neoplasm of colon, unspecified: Secondary | ICD-10-CM | POA: Insufficient documentation

## 2013-02-25 DIAGNOSIS — I10 Essential (primary) hypertension: Secondary | ICD-10-CM | POA: Insufficient documentation

## 2013-02-25 DIAGNOSIS — K5730 Diverticulosis of large intestine without perforation or abscess without bleeding: Secondary | ICD-10-CM

## 2013-02-25 DIAGNOSIS — R933 Abnormal findings on diagnostic imaging of other parts of digestive tract: Secondary | ICD-10-CM

## 2013-02-25 DIAGNOSIS — Z860100 Personal history of colon polyps, unspecified: Secondary | ICD-10-CM

## 2013-02-25 DIAGNOSIS — Z01812 Encounter for preprocedural laboratory examination: Secondary | ICD-10-CM | POA: Insufficient documentation

## 2013-02-25 DIAGNOSIS — E119 Type 2 diabetes mellitus without complications: Secondary | ICD-10-CM | POA: Insufficient documentation

## 2013-02-25 DIAGNOSIS — Z1211 Encounter for screening for malignant neoplasm of colon: Secondary | ICD-10-CM

## 2013-02-25 DIAGNOSIS — K219 Gastro-esophageal reflux disease without esophagitis: Secondary | ICD-10-CM

## 2013-02-25 DIAGNOSIS — K449 Diaphragmatic hernia without obstruction or gangrene: Secondary | ICD-10-CM | POA: Insufficient documentation

## 2013-02-25 DIAGNOSIS — K2270 Barrett's esophagus without dysplasia: Secondary | ICD-10-CM | POA: Insufficient documentation

## 2013-02-25 HISTORY — PX: ESOPHAGOGASTRODUODENOSCOPY (EGD) WITH ESOPHAGEAL DILATION: SHX5812

## 2013-02-25 HISTORY — PX: COLONOSCOPY: SHX5424

## 2013-02-25 LAB — GLUCOSE, CAPILLARY: Glucose-Capillary: 126 mg/dL — ABNORMAL HIGH (ref 70–99)

## 2013-02-25 SURGERY — COLONOSCOPY
Anesthesia: Moderate Sedation

## 2013-02-25 SURGERY — COLONOSCOPY WITH ESOPHAGOGASTRODUODENOSCOPY (EGD)
Anesthesia: Moderate Sedation

## 2013-02-25 MED ORDER — ONDANSETRON HCL 4 MG/2ML IJ SOLN
INTRAMUSCULAR | Status: DC | PRN
  Administered 2013-02-25: 4 mg via INTRAVENOUS

## 2013-02-25 MED ORDER — SODIUM CHLORIDE 0.9 % IV SOLN
INTRAVENOUS | Status: DC
  Administered 2013-02-25: 09:00:00 via INTRAVENOUS

## 2013-02-25 MED ORDER — STERILE WATER FOR IRRIGATION IR SOLN
Status: DC | PRN
  Administered 2013-02-25: 09:00:00

## 2013-02-25 MED ORDER — MEPERIDINE HCL 100 MG/ML IJ SOLN
INTRAMUSCULAR | Status: DC | PRN
  Administered 2013-02-25 (×2): 25 mg via INTRAVENOUS
  Administered 2013-02-25: 50 mg via INTRAVENOUS
  Administered 2013-02-25: 25 mg via INTRAVENOUS

## 2013-02-25 MED ORDER — MIDAZOLAM HCL 5 MG/5ML IJ SOLN
INTRAMUSCULAR | Status: DC | PRN
  Administered 2013-02-25: 2 mg via INTRAVENOUS
  Administered 2013-02-25: 1 mg via INTRAVENOUS
  Administered 2013-02-25: 2 mg via INTRAVENOUS
  Administered 2013-02-25: 1 mg via INTRAVENOUS
  Administered 2013-02-25 (×2): 2 mg via INTRAVENOUS

## 2013-02-25 MED ORDER — BUTAMBEN-TETRACAINE-BENZOCAINE 2-2-14 % EX AERO
INHALATION_SPRAY | CUTANEOUS | Status: DC | PRN
  Administered 2013-02-25: 2 via TOPICAL

## 2013-02-25 MED ORDER — MEPERIDINE HCL 100 MG/ML IJ SOLN
INTRAMUSCULAR | Status: AC
  Filled 2013-02-25: qty 2

## 2013-02-25 MED ORDER — ONDANSETRON HCL 4 MG/2ML IJ SOLN
INTRAMUSCULAR | Status: AC
  Filled 2013-02-25: qty 2

## 2013-02-25 MED ORDER — MIDAZOLAM HCL 5 MG/5ML IJ SOLN
INTRAMUSCULAR | Status: DC
Start: 2013-02-25 — End: 2013-02-25
  Filled 2013-02-25: qty 10

## 2013-02-25 NOTE — Op Note (Signed)
[facility] [address] [place], [address]   COLONOSCOPY PROCEDURE REPORT  PATIENT: Aimee Reed, Aimee Reed  MR#:         [id number] BIRTHDATE: 1956/03/29 , 56  yrs. old GENDER: Female ENDOSCOPIST: R.  Roetta Sessions, MD FACP FACG REFERRED BY:  Assunta Found, M.D. PROCEDURE DATE:  02/25/2013 PROCEDURE:     Colonoscopy with multiple snare polypectomies  INDICATIONS: history of colonic polyps  INFORMED CONSENT:  The risks, benefits, alternatives and imponderables including but not limited to bleeding, perforation as well as the possibility of a missed lesion have been reviewed.  The potential for biopsy, lesion removal, etc. have also been discussed.  Questions have been answered.  All parties agreeable. Please see the history and physical in the medical record for more information.  MEDICATIONS: Versed 10 mg IV and Demerol 125 mg IV. Zofran 4 mg IV  DESCRIPTION OF PROCEDURE:  After a digital rectal exam was performed, the EC-3890Li ([id number])  colonoscope was advanced from the anus through the rectum and colon to the area of the cecum, ileocecal valve and appendiceal orifice.  The cecum was deeply intubated.  These structures were well-seen and photographed for the record.  From the level of the cecum and ileocecal valve, the scope was slowly and cautiously withdrawn.  The mucosal surfaces were carefully surveyed utilizing scope tip deflection to facilitate fold flattening as needed.  The scope was pulled down into the rectum where a thorough examination including retroflexion was performed.    FINDINGS:  adequate preparation. Anal canal hemorrhoids and papilla; otherwise, rectal mucosa appeared normal. Few scattered left-sided diverticula;  (3) polyps clustered about the splenic flexure-the largest being approximately 8 mm in dimensions. There were all pedunculated. The The remainder of the colonic mucosa appeared normal.  THERAPEUTIC / DIAGNOSTIC MANEUVERS  PERFORMED:  The above-mentioned polyps were all hot snare removed and recovered for the pathologist  COMPLICATIONS: None  CECAL WITHDRAWAL TIME:  16 minutes  IMPRESSION:  Colonic polyps-removed as described above. Colonic diverticulosis  RECOMMENDATIONS: Followup on pathology. See EGD report the   _______________________________ eSigned:  R. Roetta Sessions, MD FACP [facility] 02/25/2013 10:39 AM   CC:

## 2013-02-25 NOTE — Op Note (Signed)
[facility] [address] [place], [address]   ENDOSCOPY PROCEDURE REPORT  PATIENT: Aimee, Reed  MR#: [id number] BIRTHDATE: [birth year] , 56  yrs. old GENDER: Female ENDOSCOPIST: R.  Roetta Sessions, MD FACP FACG REFERRED BY:  Assunta Found, M.D. PROCEDURE DATE:  02/25/2013 PROCEDURE:     EGD with Elease Hashimoto dilation followed by esophageal biopsy  INDICATIONS:       History of recurrent esophageal dysphagia; history of Barrett's esophagus  INFORMED CONSENT:   The risks, benefits, limitations, alternatives and imponderables have been discussed.  The potential for biopsy, esophogeal dilation, etc. have also been reviewed.  Questions have been answered.  All parties agreeable.  Please see the history and physical in the medical record for more information.  MEDICATIONS:  Versed 7 mg IV and Demerol 100 mg IV in divided doses. Zofran 4 mg IV. Cetacaine spray.  DESCRIPTION OF PROCEDURE:   The EG-[id number] ([id number]) and EC-3890Li ([id number])  endoscope was introduced through the mouth and advanced to the second portion of the duodenum without difficulty or limitations.  The mucosal surfaces were surveyed very carefully during advancement of the scope and upon withdrawal.  Retroflexion view of the proximal stomach and esophagogastric junction was performed.      FINDINGS: Widely patent tubular esophagus. Salmon-colored epithelium coming up 2 cm above the GE junction. No esophagitis. No tumor. Stomach empty. Small hiatal hernia. Normal gastric mucosa. Patent pylorus. Normal first and second portion of the duodenum.  THERAPEUTIC / DIAGNOSTIC MANEUVERS PERFORMED:  A 56 French Maloney dilator was passed to full insertion easily. Subsequently, a 47 French Maloney dilator was passed to full insertion easily. A look back revealed a superficial tear through the UES mucosa. Finally, biopsies of salmon-colored epithelium taken for histologic study   COMPLICATIONS:   None  IMPRESSION:   Abnormal distal esophagus consistent with prior diagnosis of Barrett's esophagus  - status post biopsy. Status post Elease Hashimoto dilation of the esophagus prior to esophageal biopsy. Hiatal hernia.  RECOMMENDATIONS:  Continue omeprazole 40 mg orally twice daily. See EGD report. Followup on pathology.    _______________________________ R. Roetta Sessions, MD FACP [facility] eSigned:  R. Roetta Sessions, MD FACP [employer] [facility] 02/25/2013 9:51 AM     CC:

## 2013-02-25 NOTE — H&P (View-Only) (Signed)
Primary Care Physician:  Colette Ribas, MD  Primary Gastroenterologist:  Roetta Sessions, MD   Chief Complaint  Patient presents with  . Dysphagia    HPI:  Aimee Reed is a 57 y.o. female here for complaints of dysphagia. 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 Patient feels like food sticking in throat. Pills, dry breads, solid foods. Liquids ok. Previously helped with dilation. Increased omeprazole to 40mg  BID two weeks ago and heartburn better. First thing in the morning has lots of fecal urgency. Started fiber every day and helps some. Flavored coffees upsets stomach. Other times may go 2-3 days without BM. When finally goes stools large/soft. No melena, brbpr. Going on for more than one year. Last colonoscopy reportedly about five years ago at [facility], h/o colon polyps per patient. We have contacted Dr. Lovell Sheehan office and there is no records. Awaiting search at [facility].   Current Outpatient Prescriptions  Medication Sig Dispense Refill  . amLODipine (NORVASC) 5 MG tablet Take 5 mg by mouth daily.        Marland Kitchen aspirin EC 81 MG tablet Take 81 mg by mouth daily.        . Biotin 5000 MCG TABS Take 5,000 mcg by mouth daily.      . DULoxetine (CYMBALTA) 60 MG capsule Take 1 capsule (60 mg total) by mouth daily.  30 capsule  3  . EVENING PRIMROSE OIL PO Take 1,000 mg by mouth 2 (two) times daily.       Marland Kitchen loratadine (CLARITIN) 10 MG tablet Take 10 mg by mouth daily.      . metFORMIN (GLUCOPHAGE) 1000 MG tablet Take 1,000 mg by mouth 2 (two) times daily with a meal.       . Nutritional Supplements (ESTROVEN PO) Take 1 tablet by mouth daily.       . propranolol (INDERAL) 10 MG tablet Take 1/2 to 2 whole pills 90 minutes before traveling or other anticipated anxiety as well as up to 5 a day.  150 tablet  0  . simvastatin (ZOCOR) 20 MG tablet Take 20 mg by mouth at bedtime.       Marland Kitchen omeprazole (PRILOSEC) 40 MG capsule 40 mg 2 (two) times daily.       No current  facility-administered medications for this visit.    Allergies as of 02/05/2013  . (No Known Allergies)    Past Medical History  Diagnosis Date  . GERD (gastroesophageal reflux disease)   . Irritable bowel syndrome   . Overactive bladder   . HTN (hypertension)   . Recurrent UTI     chronic macrodantin  . Blepharospasm   . Anxiety   . Depression   . Diabetes mellitus, type II   . Hyperlipidemia 03/11/2012    Past Surgical History  Procedure Laterality Date  . Cholecystectomy    . Carpal tunnel release      both hands  . Colonoscopy      Dr. Lovell Sheehan, around 2010  . Esophagogastroduodenoscopy  07/31/2011    [id number] cervical esophageal web s/p dilation,query short segment Barrett's esophagus s/p bx (Barrett's esophagus without dysplasia),small hiatal hernia. 56 French Maloney dilator.    Family History  Problem Relation Age of Onset  . Depression Father   . Colon cancer Neg Hx   . Liver disease Neg Hx   . ADD / ADHD Neg Hx   . Alcohol abuse Neg Hx   . Drug abuse Neg Hx   .  Anxiety disorder Neg Hx   . OCD Neg Hx   . Bipolar disorder Neg Hx   . Paranoid behavior Neg Hx   . Schizophrenia Neg Hx   . Seizures Neg Hx   . Sexual abuse Neg Hx   . Physical abuse Neg Hx   . Ulcers Mother   . Alzheimer's disease Mother   . Dementia Mother   . Alzheimer's disease Maternal Grandmother   . Dementia Maternal Grandmother     History   Social History  . Marital Status: Married    Spouse Name: N/A    Number of Children: 2  . Years of Education: N/A   Occupational History  . unemployed    Social History Main Topics  . Smoking status: Never Smoker   . Smokeless tobacco: Not on file  . Alcohol Use: No  . Drug Use: No  . Sexually Active: Not on file   Other Topics Concern  . Not on file   Social History Narrative  . No narrative on file      ROS:  General: Negative for anorexia, weight loss, fever, chills, fatigue, weakness. Eyes: Negative for vision  changes.  ENT: Negative for hoarseness, nasal congestion. CV: Negative for chest pain, angina, palpitations, dyspnea on exertion, peripheral edema.  Respiratory: Negative for dyspnea at rest, dyspnea on exertion, cough, sputum, wheezing.  GI: See history of present illness. GU:  Negative for dysuria, hematuria, urinary incontinence, urinary frequency, nocturnal urination.  MS: Negative for joint pain, low back pain.  Derm: Negative for rash or itching.  Neuro: Negative for weakness, abnormal sensation, seizure, frequent headaches, memory loss, confusion.  Psych: Negative for anxiety, depression, suicidal ideation, hallucinations.  Endo: Negative for unusual weight change.  Heme: Negative for bruising or bleeding. Allergy: Negative for rash or hives.    Physical Examination:  BP 126/72  Pulse 65  Temp(Src) 97.6 F (36.4 C) (Oral)  Ht 5\' 4"  (1.626 m)  Wt 259 lb (117.482 kg)  BMI 44.44 kg/m2   General: Well-nourished, well-developed in no acute distress. Accompanied by spouse. Head: Normocephalic, atraumatic.   Eyes: Conjunctiva pink, no icterus. Mouth: Oropharyngeal mucosa moist and pink , no lesions erythema or exudate. Neck: Supple without thyromegaly, masses, or lymphadenopathy.  Lungs: Clear to auscultation bilaterally.  Heart: Regular rate and rhythm, no murmurs rubs or gallops.  Abdomen: Bowel sounds are normal, nontender, nondistended, no hepatosplenomegaly or masses, no abdominal bruits or    hernia , no rebound or guarding.   Rectal: defer Extremities: No lower extremity edema. No clubbing or deformities.  Neuro: Alert and oriented x 4 , grossly normal neurologically.  Skin: Warm and dry, no rash or jaundice.   Psych: Alert and cooperative, normal mood and affect.

## 2013-02-25 NOTE — Interval H&P Note (Signed)
History and Physical Interval Note:  02/25/2013 9:14 AM  Aimee Reed  has presented today for surgery, with the diagnosis of DYSPHAGIA, GERD AND HISTORY OF COLONIC POLYPS  The various methods of treatment have been discussed with the patient and family. After consideration of risks, benefits and other options for treatment, the patient has consented to  Procedure(s) with comments: COLONOSCOPY (N/A) - 9:30 ESOPHAGOGASTRODUODENOSCOPY (EGD) WITH ESOPHAGEAL DILATION (N/A) as a surgical intervention .  The patient's history has been reviewed, patient examined, no change in status, stable for surgery.  I have reviewed the patient's chart and labs.  Questions were answered to the patient's satisfaction.     Aimee Reed  Patient seen and examined. EGD/ED and colonoscopy now being performed per plan. The risks, benefits, limitations, imponderables and alternatives regarding both EGD and colonoscopy have been reviewed with the patient. Questions have been answered. All parties agreeable.

## 2013-02-27 ENCOUNTER — Encounter (HOSPITAL_COMMUNITY): Payer: Self-pay | Admitting: Internal Medicine

## 2013-03-03 ENCOUNTER — Encounter: Payer: Self-pay | Admitting: Internal Medicine

## 2013-07-17 ENCOUNTER — Other Ambulatory Visit: Payer: Self-pay

## 2014-02-13 ENCOUNTER — Other Ambulatory Visit (HOSPITAL_COMMUNITY): Payer: Self-pay | Admitting: Family Medicine

## 2014-02-13 DIAGNOSIS — Z1231 Encounter for screening mammogram for malignant neoplasm of breast: Secondary | ICD-10-CM

## 2014-02-17 ENCOUNTER — Ambulatory Visit (HOSPITAL_COMMUNITY)
Admission: RE | Admit: 2014-02-17 | Discharge: 2014-02-17 | Disposition: A | Discharge status: Home / Self Care | Payer: Medicare Other | Source: Ambulatory Visit | Attending: Family Medicine | Admitting: Family Medicine

## 2014-02-17 DIAGNOSIS — Z1231 Encounter for screening mammogram for malignant neoplasm of breast: Secondary | ICD-10-CM | POA: Insufficient documentation

## 2014-02-17 DIAGNOSIS — R928 Other abnormal and inconclusive findings on diagnostic imaging of breast: Secondary | ICD-10-CM | POA: Insufficient documentation

## 2014-02-18 ENCOUNTER — Other Ambulatory Visit: Payer: Self-pay | Admitting: Family Medicine

## 2014-02-18 DIAGNOSIS — R928 Other abnormal and inconclusive findings on diagnostic imaging of breast: Secondary | ICD-10-CM

## 2014-03-04 ENCOUNTER — Ambulatory Visit (HOSPITAL_COMMUNITY)
Admission: RE | Admit: 2014-03-04 | Discharge: 2014-03-04 | Disposition: A | Discharge status: Home / Self Care | Payer: Medicare Other | Source: Ambulatory Visit | Attending: Family Medicine | Admitting: Family Medicine

## 2014-03-04 ENCOUNTER — Other Ambulatory Visit: Payer: Self-pay | Admitting: Family Medicine

## 2014-03-04 DIAGNOSIS — R928 Other abnormal and inconclusive findings on diagnostic imaging of breast: Secondary | ICD-10-CM | POA: Insufficient documentation

## 2014-03-04 DIAGNOSIS — R921 Mammographic calcification found on diagnostic imaging of breast: Secondary | ICD-10-CM

## 2014-03-19 ENCOUNTER — Ambulatory Visit
Admission: RE | Admit: 2014-03-19 | Discharge: 2014-03-19 | Disposition: A | Discharge status: Home / Self Care | Payer: Medicare Other | Source: Ambulatory Visit | Attending: Family Medicine | Admitting: Family Medicine

## 2014-03-19 DIAGNOSIS — R921 Mammographic calcification found on diagnostic imaging of breast: Secondary | ICD-10-CM

## 2014-11-04 DIAGNOSIS — L821 Other seborrheic keratosis: Secondary | ICD-10-CM | POA: Diagnosis not present

## 2014-11-04 DIAGNOSIS — L080 Pyoderma: Secondary | ICD-10-CM | POA: Diagnosis not present

## 2014-11-04 DIAGNOSIS — L723 Sebaceous cyst: Secondary | ICD-10-CM | POA: Diagnosis not present

## 2014-11-16 DIAGNOSIS — Z83518 Family history of other specified eye disorder: Secondary | ICD-10-CM | POA: Diagnosis not present

## 2014-11-16 DIAGNOSIS — Z8669 Personal history of other diseases of the nervous system and sense organs: Secondary | ICD-10-CM | POA: Diagnosis not present

## 2014-11-16 DIAGNOSIS — H2513 Age-related nuclear cataract, bilateral: Secondary | ICD-10-CM | POA: Diagnosis not present

## 2014-11-16 DIAGNOSIS — G244 Idiopathic orofacial dystonia: Secondary | ICD-10-CM | POA: Diagnosis not present

## 2014-11-16 DIAGNOSIS — H02833 Dermatochalasis of right eye, unspecified eyelid: Secondary | ICD-10-CM | POA: Diagnosis not present

## 2014-11-16 DIAGNOSIS — I10 Essential (primary) hypertension: Secondary | ICD-10-CM | POA: Diagnosis not present

## 2014-11-16 DIAGNOSIS — G245 Blepharospasm: Secondary | ICD-10-CM | POA: Diagnosis not present

## 2014-11-16 DIAGNOSIS — H02836 Dermatochalasis of left eye, unspecified eyelid: Secondary | ICD-10-CM | POA: Diagnosis not present

## 2014-11-16 DIAGNOSIS — Z7982 Long term (current) use of aspirin: Secondary | ICD-10-CM | POA: Diagnosis not present

## 2014-11-16 DIAGNOSIS — Z9089 Acquired absence of other organs: Secondary | ICD-10-CM | POA: Diagnosis not present

## 2014-11-16 DIAGNOSIS — Z8659 Personal history of other mental and behavioral disorders: Secondary | ICD-10-CM | POA: Diagnosis not present

## 2014-11-16 DIAGNOSIS — H02403 Unspecified ptosis of bilateral eyelids: Secondary | ICD-10-CM | POA: Diagnosis not present

## 2014-11-16 DIAGNOSIS — E119 Type 2 diabetes mellitus without complications: Secondary | ICD-10-CM | POA: Diagnosis not present

## 2014-11-16 DIAGNOSIS — Q820 Hereditary lymphedema: Secondary | ICD-10-CM | POA: Diagnosis not present

## 2014-11-30 DIAGNOSIS — E782 Mixed hyperlipidemia: Secondary | ICD-10-CM | POA: Diagnosis not present

## 2014-11-30 DIAGNOSIS — Z6841 Body Mass Index (BMI) 40.0 and over, adult: Secondary | ICD-10-CM | POA: Diagnosis not present

## 2014-11-30 DIAGNOSIS — I10 Essential (primary) hypertension: Secondary | ICD-10-CM | POA: Diagnosis not present

## 2014-11-30 DIAGNOSIS — F329 Major depressive disorder, single episode, unspecified: Secondary | ICD-10-CM | POA: Diagnosis not present

## 2014-12-02 DIAGNOSIS — Z713 Dietary counseling and surveillance: Secondary | ICD-10-CM | POA: Diagnosis not present

## 2014-12-02 DIAGNOSIS — E782 Mixed hyperlipidemia: Secondary | ICD-10-CM | POA: Diagnosis not present

## 2014-12-02 DIAGNOSIS — E119 Type 2 diabetes mellitus without complications: Secondary | ICD-10-CM | POA: Diagnosis not present

## 2014-12-02 DIAGNOSIS — I10 Essential (primary) hypertension: Secondary | ICD-10-CM | POA: Diagnosis not present

## 2014-12-09 DIAGNOSIS — I10 Essential (primary) hypertension: Secondary | ICD-10-CM | POA: Diagnosis not present

## 2014-12-09 DIAGNOSIS — E1165 Type 2 diabetes mellitus with hyperglycemia: Secondary | ICD-10-CM | POA: Diagnosis not present

## 2014-12-09 DIAGNOSIS — E782 Mixed hyperlipidemia: Secondary | ICD-10-CM | POA: Diagnosis not present

## 2015-01-11 DIAGNOSIS — G245 Blepharospasm: Secondary | ICD-10-CM | POA: Diagnosis not present

## 2015-02-15 DIAGNOSIS — L255 Unspecified contact dermatitis due to plants, except food: Secondary | ICD-10-CM | POA: Diagnosis not present

## 2015-02-15 DIAGNOSIS — Z6841 Body Mass Index (BMI) 40.0 and over, adult: Secondary | ICD-10-CM | POA: Diagnosis not present

## 2015-03-01 DIAGNOSIS — Z6841 Body Mass Index (BMI) 40.0 and over, adult: Secondary | ICD-10-CM | POA: Diagnosis not present

## 2015-03-01 DIAGNOSIS — F5112 Insufficient sleep syndrome: Secondary | ICD-10-CM | POA: Diagnosis not present

## 2015-03-01 DIAGNOSIS — E119 Type 2 diabetes mellitus without complications: Secondary | ICD-10-CM | POA: Diagnosis not present

## 2015-03-01 DIAGNOSIS — R5383 Other fatigue: Secondary | ICD-10-CM | POA: Diagnosis not present

## 2015-03-01 DIAGNOSIS — Z0000 Encounter for general adult medical examination without abnormal findings: Secondary | ICD-10-CM | POA: Diagnosis not present

## 2015-03-11 DIAGNOSIS — D279 Benign neoplasm of unspecified ovary: Secondary | ICD-10-CM | POA: Diagnosis not present

## 2015-03-11 DIAGNOSIS — G245 Blepharospasm: Secondary | ICD-10-CM | POA: Diagnosis not present

## 2015-04-08 DIAGNOSIS — G245 Blepharospasm: Secondary | ICD-10-CM | POA: Diagnosis not present

## 2015-04-08 DIAGNOSIS — H2513 Age-related nuclear cataract, bilateral: Secondary | ICD-10-CM | POA: Diagnosis not present

## 2015-04-08 DIAGNOSIS — D271 Benign neoplasm of left ovary: Secondary | ICD-10-CM | POA: Diagnosis not present

## 2015-04-08 DIAGNOSIS — Z8639 Personal history of other endocrine, nutritional and metabolic disease: Secondary | ICD-10-CM | POA: Diagnosis not present

## 2015-04-08 DIAGNOSIS — D270 Benign neoplasm of right ovary: Secondary | ICD-10-CM | POA: Diagnosis not present

## 2015-04-12 DIAGNOSIS — E782 Mixed hyperlipidemia: Secondary | ICD-10-CM | POA: Diagnosis not present

## 2015-04-12 DIAGNOSIS — I10 Essential (primary) hypertension: Secondary | ICD-10-CM | POA: Diagnosis not present

## 2015-04-12 DIAGNOSIS — E1165 Type 2 diabetes mellitus with hyperglycemia: Secondary | ICD-10-CM | POA: Diagnosis not present

## 2015-04-12 DIAGNOSIS — Z713 Dietary counseling and surveillance: Secondary | ICD-10-CM | POA: Diagnosis not present

## 2015-04-19 DIAGNOSIS — I10 Essential (primary) hypertension: Secondary | ICD-10-CM | POA: Diagnosis not present

## 2015-04-19 DIAGNOSIS — E785 Hyperlipidemia, unspecified: Secondary | ICD-10-CM | POA: Diagnosis not present

## 2015-04-19 DIAGNOSIS — E6609 Other obesity due to excess calories: Secondary | ICD-10-CM | POA: Diagnosis not present

## 2015-04-19 DIAGNOSIS — E1165 Type 2 diabetes mellitus with hyperglycemia: Secondary | ICD-10-CM | POA: Diagnosis not present

## 2015-05-11 ENCOUNTER — Other Ambulatory Visit (HOSPITAL_COMMUNITY): Payer: Self-pay | Admitting: Family Medicine

## 2015-05-11 DIAGNOSIS — Z1231 Encounter for screening mammogram for malignant neoplasm of breast: Secondary | ICD-10-CM

## 2015-05-13 DIAGNOSIS — G245 Blepharospasm: Secondary | ICD-10-CM | POA: Diagnosis not present

## 2015-05-19 DIAGNOSIS — I10 Essential (primary) hypertension: Secondary | ICD-10-CM | POA: Diagnosis not present

## 2015-05-19 DIAGNOSIS — Z23 Encounter for immunization: Secondary | ICD-10-CM | POA: Diagnosis not present

## 2015-05-19 DIAGNOSIS — Z1389 Encounter for screening for other disorder: Secondary | ICD-10-CM | POA: Diagnosis not present

## 2015-05-19 DIAGNOSIS — Z6841 Body Mass Index (BMI) 40.0 and over, adult: Secondary | ICD-10-CM | POA: Diagnosis not present

## 2015-05-19 DIAGNOSIS — T464X5A Adverse effect of angiotensin-converting-enzyme inhibitors, initial encounter: Secondary | ICD-10-CM | POA: Diagnosis not present

## 2015-05-21 ENCOUNTER — Ambulatory Visit (HOSPITAL_COMMUNITY)
Admission: RE | Admit: 2015-05-21 | Discharge: 2015-05-21 | Disposition: A | Discharge status: Home / Self Care | Payer: Medicare Other | Source: Ambulatory Visit | Attending: Family Medicine | Admitting: Family Medicine

## 2015-05-21 DIAGNOSIS — Z1231 Encounter for screening mammogram for malignant neoplasm of breast: Secondary | ICD-10-CM | POA: Insufficient documentation

## 2015-06-02 DIAGNOSIS — G4733 Obstructive sleep apnea (adult) (pediatric): Secondary | ICD-10-CM | POA: Diagnosis not present

## 2015-07-07 DIAGNOSIS — F329 Major depressive disorder, single episode, unspecified: Secondary | ICD-10-CM | POA: Diagnosis not present

## 2015-07-07 DIAGNOSIS — G4733 Obstructive sleep apnea (adult) (pediatric): Secondary | ICD-10-CM | POA: Diagnosis not present

## 2015-07-07 DIAGNOSIS — Z7982 Long term (current) use of aspirin: Secondary | ICD-10-CM | POA: Diagnosis not present

## 2015-07-07 DIAGNOSIS — R9431 Abnormal electrocardiogram [ECG] [EKG]: Secondary | ICD-10-CM | POA: Diagnosis not present

## 2015-07-07 DIAGNOSIS — F419 Anxiety disorder, unspecified: Secondary | ICD-10-CM | POA: Diagnosis not present

## 2015-07-07 DIAGNOSIS — Z9049 Acquired absence of other specified parts of digestive tract: Secondary | ICD-10-CM | POA: Diagnosis not present

## 2015-07-07 DIAGNOSIS — H540 Blindness, both eyes: Secondary | ICD-10-CM | POA: Diagnosis not present

## 2015-07-07 DIAGNOSIS — I4581 Long QT syndrome: Secondary | ICD-10-CM | POA: Diagnosis not present

## 2015-07-07 DIAGNOSIS — I10 Essential (primary) hypertension: Secondary | ICD-10-CM | POA: Diagnosis not present

## 2015-07-07 DIAGNOSIS — H2513 Age-related nuclear cataract, bilateral: Secondary | ICD-10-CM | POA: Diagnosis not present

## 2015-07-07 DIAGNOSIS — R482 Apraxia: Secondary | ICD-10-CM | POA: Diagnosis not present

## 2015-07-07 DIAGNOSIS — Z23 Encounter for immunization: Secondary | ICD-10-CM | POA: Diagnosis not present

## 2015-07-07 DIAGNOSIS — K219 Gastro-esophageal reflux disease without esophagitis: Secondary | ICD-10-CM | POA: Diagnosis not present

## 2015-07-07 DIAGNOSIS — Z79899 Other long term (current) drug therapy: Secondary | ICD-10-CM | POA: Diagnosis not present

## 2015-07-07 DIAGNOSIS — E119 Type 2 diabetes mellitus without complications: Secondary | ICD-10-CM | POA: Diagnosis not present

## 2015-07-07 DIAGNOSIS — G245 Blepharospasm: Secondary | ICD-10-CM | POA: Diagnosis not present

## 2015-07-12 ENCOUNTER — Encounter: Payer: Self-pay | Admitting: "Endocrinology

## 2015-07-12 DIAGNOSIS — Z0000 Encounter for general adult medical examination without abnormal findings: Secondary | ICD-10-CM | POA: Diagnosis not present

## 2015-07-12 DIAGNOSIS — Z6841 Body Mass Index (BMI) 40.0 and over, adult: Secondary | ICD-10-CM | POA: Diagnosis not present

## 2015-07-12 DIAGNOSIS — E119 Type 2 diabetes mellitus without complications: Secondary | ICD-10-CM | POA: Diagnosis not present

## 2015-07-12 DIAGNOSIS — R5383 Other fatigue: Secondary | ICD-10-CM | POA: Diagnosis not present

## 2015-07-12 DIAGNOSIS — E559 Vitamin D deficiency, unspecified: Secondary | ICD-10-CM | POA: Diagnosis not present

## 2015-07-12 LAB — HEMOGLOBIN A1C: Hgb A1c MFr Bld: 7.4 % — AB (ref 4.0–6.0)

## 2015-07-13 DIAGNOSIS — R5383 Other fatigue: Secondary | ICD-10-CM | POA: Diagnosis not present

## 2015-07-13 DIAGNOSIS — Z6841 Body Mass Index (BMI) 40.0 and over, adult: Secondary | ICD-10-CM | POA: Diagnosis not present

## 2015-07-13 DIAGNOSIS — Z0000 Encounter for general adult medical examination without abnormal findings: Secondary | ICD-10-CM | POA: Diagnosis not present

## 2015-07-13 DIAGNOSIS — E119 Type 2 diabetes mellitus without complications: Secondary | ICD-10-CM | POA: Diagnosis not present

## 2015-07-13 HISTORY — PX: EYE SURGERY: SHX253

## 2015-07-14 DIAGNOSIS — H2513 Age-related nuclear cataract, bilateral: Secondary | ICD-10-CM | POA: Diagnosis not present

## 2015-07-14 DIAGNOSIS — R482 Apraxia: Secondary | ICD-10-CM | POA: Diagnosis not present

## 2015-07-14 DIAGNOSIS — G4733 Obstructive sleep apnea (adult) (pediatric): Secondary | ICD-10-CM | POA: Diagnosis not present

## 2015-07-14 DIAGNOSIS — I10 Essential (primary) hypertension: Secondary | ICD-10-CM | POA: Diagnosis not present

## 2015-07-14 DIAGNOSIS — F329 Major depressive disorder, single episode, unspecified: Secondary | ICD-10-CM | POA: Diagnosis not present

## 2015-07-14 DIAGNOSIS — K219 Gastro-esophageal reflux disease without esophagitis: Secondary | ICD-10-CM | POA: Diagnosis not present

## 2015-07-14 DIAGNOSIS — Z7982 Long term (current) use of aspirin: Secondary | ICD-10-CM | POA: Diagnosis not present

## 2015-07-14 DIAGNOSIS — E119 Type 2 diabetes mellitus without complications: Secondary | ICD-10-CM | POA: Diagnosis not present

## 2015-07-14 DIAGNOSIS — H540 Blindness, both eyes: Secondary | ICD-10-CM | POA: Diagnosis not present

## 2015-07-14 DIAGNOSIS — F419 Anxiety disorder, unspecified: Secondary | ICD-10-CM | POA: Diagnosis not present

## 2015-07-14 DIAGNOSIS — Z79899 Other long term (current) drug therapy: Secondary | ICD-10-CM | POA: Diagnosis not present

## 2015-07-14 DIAGNOSIS — G245 Blepharospasm: Secondary | ICD-10-CM | POA: Diagnosis not present

## 2015-07-14 DIAGNOSIS — Z9049 Acquired absence of other specified parts of digestive tract: Secondary | ICD-10-CM | POA: Diagnosis not present

## 2015-07-14 DIAGNOSIS — Z23 Encounter for immunization: Secondary | ICD-10-CM | POA: Diagnosis not present

## 2015-07-15 DIAGNOSIS — E119 Type 2 diabetes mellitus without complications: Secondary | ICD-10-CM | POA: Diagnosis not present

## 2015-07-15 DIAGNOSIS — Z9049 Acquired absence of other specified parts of digestive tract: Secondary | ICD-10-CM | POA: Diagnosis not present

## 2015-07-15 DIAGNOSIS — F419 Anxiety disorder, unspecified: Secondary | ICD-10-CM | POA: Diagnosis not present

## 2015-07-15 DIAGNOSIS — H2513 Age-related nuclear cataract, bilateral: Secondary | ICD-10-CM | POA: Diagnosis not present

## 2015-07-15 DIAGNOSIS — I10 Essential (primary) hypertension: Secondary | ICD-10-CM | POA: Diagnosis not present

## 2015-07-15 DIAGNOSIS — R482 Apraxia: Secondary | ICD-10-CM | POA: Diagnosis not present

## 2015-07-15 DIAGNOSIS — K219 Gastro-esophageal reflux disease without esophagitis: Secondary | ICD-10-CM | POA: Diagnosis not present

## 2015-07-15 DIAGNOSIS — G4733 Obstructive sleep apnea (adult) (pediatric): Secondary | ICD-10-CM | POA: Diagnosis not present

## 2015-07-15 DIAGNOSIS — H540 Blindness, both eyes: Secondary | ICD-10-CM | POA: Diagnosis not present

## 2015-07-15 DIAGNOSIS — G245 Blepharospasm: Secondary | ICD-10-CM | POA: Diagnosis not present

## 2015-07-15 DIAGNOSIS — Z79899 Other long term (current) drug therapy: Secondary | ICD-10-CM | POA: Diagnosis not present

## 2015-07-15 DIAGNOSIS — Z23 Encounter for immunization: Secondary | ICD-10-CM | POA: Diagnosis not present

## 2015-07-15 DIAGNOSIS — F329 Major depressive disorder, single episode, unspecified: Secondary | ICD-10-CM | POA: Diagnosis not present

## 2015-07-15 DIAGNOSIS — Z7982 Long term (current) use of aspirin: Secondary | ICD-10-CM | POA: Diagnosis not present

## 2015-07-22 ENCOUNTER — Ambulatory Visit: Payer: Self-pay | Admitting: "Endocrinology

## 2015-07-22 DIAGNOSIS — I10 Essential (primary) hypertension: Secondary | ICD-10-CM | POA: Diagnosis not present

## 2015-07-22 DIAGNOSIS — E119 Type 2 diabetes mellitus without complications: Secondary | ICD-10-CM | POA: Diagnosis not present

## 2015-07-22 DIAGNOSIS — F419 Anxiety disorder, unspecified: Secondary | ICD-10-CM | POA: Diagnosis not present

## 2015-07-22 DIAGNOSIS — R482 Apraxia: Secondary | ICD-10-CM | POA: Diagnosis not present

## 2015-07-22 DIAGNOSIS — H2513 Age-related nuclear cataract, bilateral: Secondary | ICD-10-CM | POA: Diagnosis not present

## 2015-07-22 DIAGNOSIS — F329 Major depressive disorder, single episode, unspecified: Secondary | ICD-10-CM | POA: Diagnosis not present

## 2015-07-22 DIAGNOSIS — Z48810 Encounter for surgical aftercare following surgery on the sense organs: Secondary | ICD-10-CM | POA: Diagnosis not present

## 2015-07-22 DIAGNOSIS — Z7984 Long term (current) use of oral hypoglycemic drugs: Secondary | ICD-10-CM | POA: Diagnosis not present

## 2015-07-22 DIAGNOSIS — H02206 Unspecified lagophthalmos left eye, unspecified eyelid: Secondary | ICD-10-CM | POA: Diagnosis not present

## 2015-07-22 DIAGNOSIS — Z7982 Long term (current) use of aspirin: Secondary | ICD-10-CM | POA: Diagnosis not present

## 2015-07-22 DIAGNOSIS — Z79899 Other long term (current) drug therapy: Secondary | ICD-10-CM | POA: Diagnosis not present

## 2015-07-22 DIAGNOSIS — G245 Blepharospasm: Secondary | ICD-10-CM | POA: Diagnosis not present

## 2015-07-29 ENCOUNTER — Encounter: Payer: Self-pay | Admitting: "Endocrinology

## 2015-07-29 ENCOUNTER — Ambulatory Visit (INDEPENDENT_AMBULATORY_CARE_PROVIDER_SITE_OTHER): Payer: Medicare Other | Admitting: "Endocrinology

## 2015-07-29 VITALS — BP 134/79 | HR 78 | Ht 64.0 in | Wt 250.0 lb

## 2015-07-29 DIAGNOSIS — I10 Essential (primary) hypertension: Secondary | ICD-10-CM | POA: Insufficient documentation

## 2015-07-29 DIAGNOSIS — E118 Type 2 diabetes mellitus with unspecified complications: Secondary | ICD-10-CM | POA: Diagnosis not present

## 2015-07-29 DIAGNOSIS — IMO0002 Reserved for concepts with insufficient information to code with codable children: Secondary | ICD-10-CM | POA: Insufficient documentation

## 2015-07-29 DIAGNOSIS — E785 Hyperlipidemia, unspecified: Secondary | ICD-10-CM

## 2015-07-29 DIAGNOSIS — E1165 Type 2 diabetes mellitus with hyperglycemia: Secondary | ICD-10-CM

## 2015-07-29 DIAGNOSIS — E6609 Other obesity due to excess calories: Secondary | ICD-10-CM | POA: Insufficient documentation

## 2015-07-29 NOTE — Patient Instructions (Signed)

## 2015-07-29 NOTE — Progress Notes (Signed)
Subjective:    Patient ID: Aimee Reed, female    DOB: 1955/12/08,    Past Medical History  Diagnosis Date  . GERD (gastroesophageal reflux disease)   . Irritable bowel syndrome   . Overactive bladder   . HTN (hypertension)   . Recurrent UTI     chronic macrodantin  . Blepharospasm   . Anxiety   . Depression   . Diabetes mellitus, type II ([facility])   . Hyperlipidemia 03/11/2012   Past Surgical History  Procedure Laterality Date  . Cholecystectomy    . Carpal tunnel release      both hands  . Colonoscopy      Dr. Arnoldo Morale, around 2010  . Esophagogastroduodenoscopy  07/31/2011    [id number] cervical esophageal web s/p dilation,query short segment Barrett's esophagus s/p bx (Barrett's esophagus without dysplasia),small hiatal hernia. 53 French Maloney dilator.  . Colonoscopy N/A 02/25/2013    Procedure: COLONOSCOPY;  Surgeon: Daneil Dolin, MD;  Location: [facility];  Service: Endoscopy;  Laterality: N/A;  9:30  . Esophagogastroduodenoscopy (egd) with esophageal dilation N/A 02/25/2013    Procedure: ESOPHAGOGASTRODUODENOSCOPY (EGD) WITH ESOPHAGEAL DILATION;  Surgeon: Daneil Dolin, MD;  Location: [facility];  Service: Endoscopy;  Laterality: N/A;   Social History   Social History  . Marital Status: Married    Spouse Name: N/A  . Number of Children: 2  . Years of Education: N/A   Occupational History  . unemployed    Social History Main Topics  . Smoking status: Never Smoker   . Smokeless tobacco: Never Used  . Alcohol Use: No  . Drug Use: No  . Sexual Activity: Not Asked   Other Topics Concern  . None   Social History Narrative   Outpatient Encounter Prescriptions as of 07/29/2015  Medication Sig  . aspirin EC 81 MG tablet Take 81 mg by mouth daily.    . cetirizine (ZYRTEC) 10 MG tablet Take 10 mg by mouth daily.  Marland Kitchen losartan (COZAAR) 100 MG tablet Take 100 mg by mouth daily.  . Melatonin 3 MG TABS Take by mouth at bedtime.  . Multiple Vitamin  (MULTIVITAMIN) capsule Take 1 capsule by mouth daily.  . propranolol (INDERAL) 10 MG tablet Take 1-2 tablets (10-20 mg total) by mouth 2 (two) times daily. Takes 2 tablets (20mg ) in the mornings and 1 tablet (10mg ) at bedtime  . sitaGLIPtin-metformin (JANUMET) 50-1000 MG tablet Take 1 tablet by mouth 2 (two) times daily with a meal.  . [DISCONTINUED] simvastatin (ZOCOR) 20 MG tablet Take 20 mg by mouth at bedtime.   . [DISCONTINUED] amLODipine (NORVASC) 5 MG tablet Take 5 mg by mouth daily.  . [DISCONTINUED] Biotin 5000 MCG TABS Take 5,000 mcg by mouth daily.  . [DISCONTINUED] Cyanocobalamin 2500 MCG TABS Take 2,500 mcg by mouth daily.  . [DISCONTINUED] DULoxetine (CYMBALTA) 60 MG capsule Take 1 capsule (60 mg total) by mouth daily.  . [DISCONTINUED] EVENING PRIMROSE OIL PO Take 1,000 mg by mouth 2 (two) times daily.   . [DISCONTINUED] loratadine (CLARITIN) 10 MG tablet Take 10 mg by mouth daily.  . [DISCONTINUED] metFORMIN (GLUCOPHAGE) 1000 MG tablet Take 1,000 mg by mouth 2 (two) times daily with a meal.   . [DISCONTINUED] Nutritional Supplements (ESTROVEN PO) Take 1 tablet by mouth daily.   . [DISCONTINUED] omeprazole (PRILOSEC) 40 MG capsule 40 mg 2 (two) times daily.  . [DISCONTINUED] polyethylene glycol-electrolytes (TRILYTE) 420 G solution Take 4,000 mLs by mouth as directed.  No facility-administered encounter medications on file as of 07/29/2015.   ALLERGIES: No Known Allergies VACCINATION STATUS:  There is no immunization history on file for this patient.  Diabetes She presents for her follow-up diabetic visit. She has type 2 diabetes mellitus. Onset time: She was diagnosed at approximate age of 35 years. Her disease course has been worsening. There are no hypoglycemic associated symptoms. Pertinent negatives for hypoglycemia include no confusion, headaches, pallor or seizures. Associated symptoms include polydipsia and polyuria. Pertinent negatives for diabetes include no chest  pain and no polyphagia. There are no hypoglycemic complications. Symptoms are worsening. Risk factors for coronary artery disease include diabetes mellitus, dyslipidemia, hypertension, obesity and sedentary lifestyle. She is compliant with treatment most of the time. Her weight is stable. She is following a generally unhealthy diet. She participates in exercise intermittently. Her overall blood glucose range is 140-180 mg/dl. An ACE inhibitor/angiotensin II receptor blocker is being taken. Eye exam is current.  Hyperlipidemia This is a chronic problem. The current episode started more than 1 year ago. The problem is controlled. Pertinent negatives include no chest pain, myalgias or shortness of breath. Current antihyperlipidemic treatment includes statins. Risk factors for coronary artery disease include diabetes mellitus, dyslipidemia, hypertension, obesity and a sedentary lifestyle.  Hypertension This is a chronic problem. The current episode started more than 1 year ago. Pertinent negatives include no chest pain, headaches, palpitations or shortness of breath. Risk factors for coronary artery disease include dyslipidemia, diabetes mellitus and obesity. Past treatments include angiotensin blockers.     Review of Systems  Constitutional: Negative for unexpected weight change.  HENT: Negative for trouble swallowing and voice change.   Eyes: Negative for visual disturbance.  Respiratory: Negative for cough, shortness of breath and wheezing.   Cardiovascular: Negative for chest pain, palpitations and leg swelling.  Gastrointestinal: Negative for nausea, vomiting and diarrhea.  Endocrine: Positive for polydipsia and polyuria. Negative for cold intolerance, heat intolerance and polyphagia.  Musculoskeletal: Negative for myalgias and arthralgias.  Skin: Negative for color change, pallor, rash and wound.  Neurological: Negative for seizures and headaches.  Psychiatric/Behavioral: Negative for suicidal  ideas and confusion.    Objective:    BP 134/79 mmHg  Pulse 78  Ht 5\' 4"  (1.626 m)  Wt 250 lb (113.399 kg)  BMI 42.89 kg/m2  SpO2 96%  Wt Readings from Last 3 Encounters:  07/29/15 250 lb (113.399 kg)  02/24/13 256 lb 6.4 oz (116.302 kg)  02/05/13 259 lb (117.482 kg)    Physical Exam  Constitutional: She is oriented to person, place, and time. She appears well-developed.  HENT:  Head: Normocephalic and atraumatic.  Eyes: EOM are normal.  She underwent bilateral upper eyelid surgery.  Neck: Normal range of motion. Neck supple. No tracheal deviation present. No thyromegaly present.  Cardiovascular: Normal rate and regular rhythm.   Pulmonary/Chest: Effort normal and breath sounds normal.  Abdominal: Soft. Bowel sounds are normal. There is no tenderness. There is no guarding.  Musculoskeletal: Normal range of motion. She exhibits no edema.  Neurological: She is alert and oriented to person, place, and time. She has normal reflexes. No cranial nerve deficit. Coordination normal.  Skin: Skin is warm and dry. No rash noted. No erythema. No pallor.  Psychiatric: She has a normal mood and affect. Judgment normal.    Results for orders placed or performed in visit on 07/29/15  Hemoglobin A1c  Result Value Ref Range   Hgb A1c MFr Bld 7.4 (A) 4.0 - 6.0 %  Complete Blood Count (Most recent): Lab Results  Component Value Date   WBC 8.1 02/18/2011   HGB 13.4 02/18/2011   HCT 40.9 02/18/2011   MCV 91.5 02/18/2011   PLT 233 02/18/2011   Chemistry (most recent): Lab Results  Component Value Date   NA 136 02/18/2011   K 3.7 02/18/2011   CL 98 02/18/2011   CO2 27 02/18/2011   BUN 9 02/18/2011   CREATININE 0.65 02/18/2011   Diabetic Labs (most recent): Lab Results  Component Value Date   HGBA1C 7.4* 07/12/2015   Lipid profile (most recent): No results found for: TRIG, CHOL       Assessment & Plan:   1. Uncontrolled type 2 diabetes mellitus with complication, without  long-term current use of insulin ([place])  She  remains at a high risk for more acute and chronic complications of diabetes which include CAD, CVA, CKD, retinopathy, and neuropathy. These are all discussed in detail with the patient.  Patient came with higher A1c of 7.4 %.  Glucose logs and insulin administration records pertaining to this visit,  to be scanned into patient's records.  Recent labs reviewed.   - I have re-counseled the patient on diet management and weight loss  by adopting a carbohydrate restricted / protein rich  Diet.  - Suggestion is made for patient to avoid simple carbohydrates   from their diet including Cakes , Desserts, Ice Cream,  Soda (  diet and regular) , Sweet Tea , Candies,  Chips, Cookies, Artificial Sweeteners,   and "Sugar-free" Products .  This will help patient to have stable blood glucose profile and potentially avoid unintended  Weight gain.  - Patient is advised to stick to a routine mealtimes to eat 3 meals  a day and avoid unnecessary snacks ( to snack only to correct hypoglycemia).   - I have approached patient with the following individualized plan to manage diabetes and patient agrees. -Based on her A1c she would require an additional therapy however patient insists that she would do better going forward and wants to avoid it.   - I have counseled the patient on diet and weight management.  -I will continue Janumet 50/1000mg  po BID, will consider Invokana next visit if she cannot control . -I have emphasized the importance of dietary management to help patient lose more weight.  - Patient specific target  for A1c; LDL, HDL, Triglycerides, and  Waist Circumference were discussed in detail.  2) BP/HTN: controlled. Continue current medications including ACEI/ARB. 3) Lipids/HPL:   Controlled, due to unexplained elevation in transaminases I advised the patient to hold  Statins. She will call her GI doctor for follow-up and address elevated transaminases.   4)  Weight/Diet: exercise, and carbohydrates information provided.  5) Chronic Care/Health Maintenance:  -Patient is on ACEI/ARB medications and encouraged to continue to follow up with Ophthalmology, Podiatrist at least yearly or according to recommendations, and advised to  stay away from smoking. I have recommended yearly flu vaccine and pneumonia vaccination at least every 5 years; moderate intensity exercise for up to 150 minutes weekly; and  sleep for at least 7 hours a day.  - 25 minutes of time was spent on the care of this patient , 50% of which was applied for counseling on diabetes complications and their preventions.  - I advised patient to maintain close follow up with their PCP for primary care needs.  Patient is asked to bring meter and  blood glucose logs during their next  visit.   Follow up plan: -Return in about 3 months (around 10/29/2015) for diabetes, high blood pressure, high cholesterol, follow up with pre-visit labs, meter, and logs.  Glade Lloyd, MD Phone: [phone]  Fax: [phone]   07/29/2015, 7:36 PM

## 2015-08-02 ENCOUNTER — Encounter: Payer: Self-pay | Admitting: Gastroenterology

## 2015-08-02 ENCOUNTER — Ambulatory Visit (INDEPENDENT_AMBULATORY_CARE_PROVIDER_SITE_OTHER): Payer: Medicare Other | Admitting: Gastroenterology

## 2015-08-02 VITALS — BP 159/80 | HR 79 | Temp 97.5°F | Ht 64.0 in | Wt 251.2 lb

## 2015-08-02 DIAGNOSIS — Z23 Encounter for immunization: Secondary | ICD-10-CM | POA: Diagnosis not present

## 2015-08-02 DIAGNOSIS — R7989 Other specified abnormal findings of blood chemistry: Secondary | ICD-10-CM | POA: Diagnosis not present

## 2015-08-02 DIAGNOSIS — R945 Abnormal results of liver function studies: Secondary | ICD-10-CM | POA: Insufficient documentation

## 2015-08-02 DIAGNOSIS — Z1389 Encounter for screening for other disorder: Secondary | ICD-10-CM | POA: Diagnosis not present

## 2015-08-02 DIAGNOSIS — Z6841 Body Mass Index (BMI) 40.0 and over, adult: Secondary | ICD-10-CM | POA: Diagnosis not present

## 2015-08-02 DIAGNOSIS — K219 Gastro-esophageal reflux disease without esophagitis: Secondary | ICD-10-CM | POA: Diagnosis not present

## 2015-08-02 DIAGNOSIS — B079 Viral wart, unspecified: Secondary | ICD-10-CM | POA: Diagnosis not present

## 2015-08-02 DIAGNOSIS — M5412 Radiculopathy, cervical region: Secondary | ICD-10-CM | POA: Diagnosis not present

## 2015-08-02 NOTE — Progress Notes (Signed)
cc'ed to pcp °

## 2015-08-02 NOTE — Progress Notes (Signed)
Primary Care Physician:  Purvis Kilts, MD  Primary Gastroenterologist:  Garfield Cornea, MD   Chief Complaint  Patient presents with  . elevated Liver enzynmes    HPI:  Aimee Reed is a 59 y.o. female here at the request of Dr. Dorris Fetch for further evaluation of abnormal LFTs. Patient states she's never had any issues with her liver blood work before. Currently her AST and ALT were elevated 89, 90 respectively. Stopped her simvastatin about one week ago. Had been on it for years without any problems. Denies any other recent medication changes. Denies any abdominal pain. Appetite is good. No significant weight changes over the past year. She has chronic GERD on omeprazole 40 mg twice a day with breakthrough symptoms. Taking omeprazole after meals. Denies any solid food dysphagia. Occasionally has problems swallowing large pills. Bowel movements are regular. No blood in the stool or melena.    Current Outpatient Prescriptions  Medication Sig Dispense Refill  . aspirin EC 81 MG tablet Take 81 mg by mouth daily.      . cetirizine (ZYRTEC) 10 MG tablet Take 10 mg by mouth daily.    . DULoxetine (CYMBALTA) 60 MG capsule 60 mg delayed release capsule; oral every day    . losartan (COZAAR) 100 MG tablet Take 100 mg by mouth daily.    . Melatonin 3 MG TABS Take by mouth at bedtime.    . Multiple Vitamin (MULTIVITAMIN) capsule Take 1 capsule by mouth daily.    Marland Kitchen omeprazole (PRILOSEC) 40 MG capsule Take 40 mg by mouth 2 (two) times daily.    . propranolol (INDERAL) 10 MG tablet Take 1-2 tablets (10-20 mg total) by mouth 2 (two) times daily. Takes 2 tablets (20mg ) in the mornings and 1 tablet (10mg ) at bedtime 90 tablet 3  . sitaGLIPtin-metformin (JANUMET) 50-1000 MG tablet Take 1 tablet by mouth 2 (two) times daily with a meal.     No current facility-administered medications for this visit.    Allergies as of 08/02/2015  . (No Known Allergies)    Past Medical History  Diagnosis Date  .  GERD (gastroesophageal reflux disease)   . Irritable bowel syndrome   . Overactive bladder   . HTN (hypertension)   . Recurrent UTI     chronic macrodantin  . Blepharospasm   . Anxiety   . Depression   . Diabetes mellitus, type II ([place])   . Hyperlipidemia 03/11/2012    Past Surgical History  Procedure Laterality Date  . Cholecystectomy    . Carpal tunnel release      both hands  . Colonoscopy      Dr. Arnoldo Morale, around 2010  . Esophagogastroduodenoscopy  07/31/2011    [id number] cervical esophageal web s/p dilation,query short segment Barrett's esophagus s/p bx (Barrett's esophagus without dysplasia),small hiatal hernia. 18 French Maloney dilator.  . Colonoscopy N/A 02/25/2013    [id number] polyps, next tcs 02/2018 give tubular adenomas  . Esophagogastroduodenoscopy (egd) with esophageal dilation N/A 02/25/2013    [id number] distal esophagus/HH. next EGD 02/2016 given prior h/o Barrett's  . Eye surgery  November 2016    Eyelids, nerve issues    Family History  Problem Relation Age of Onset  . Depression Father   . Colon cancer Neg Hx   . Liver disease Neg Hx   . ADD / ADHD Neg Hx   . Alcohol abuse Neg Hx   . Drug abuse Neg Hx   . Anxiety disorder Neg Hx   . OCD  Neg Hx   . Bipolar disorder Neg Hx   . Paranoid behavior Neg Hx   . Schizophrenia Neg Hx   . Seizures Neg Hx   . Sexual abuse Neg Hx   . Physical abuse Neg Hx   . Ulcers Mother   . Alzheimer's disease Mother   . Dementia Mother   . Alzheimer's disease Maternal Grandmother   . Dementia Maternal Grandmother     Social History   Social History  . Marital Status: Married    Spouse Name: N/A  . Number of Children: 2  . Years of Education: N/A   Occupational History  . unemployed    Social History Main Topics  . Smoking status: Never Smoker   . Smokeless tobacco: Never Used  . Alcohol Use: No  . Drug Use: No  . Sexual Activity: Not on file   Other Topics Concern  . Not on file   Social History  Narrative      ROS:  General: Negative for anorexia, weight loss, fever, chills, fatigue, weakness. Eyes: Negative for vision changes. Recent eye surgery for eyelid droop related to nerve issues.  ENT: Negative for hoarseness, difficulty swallowing , nasal congestion. CV: Negative for chest pain, angina, palpitations, dyspnea on exertion, peripheral edema.  Respiratory: Negative for dyspnea at rest, dyspnea on exertion, cough, sputum, wheezing.  GI: See history of present illness. GU:  Negative for dysuria, hematuria, urinary incontinence, urinary frequency, nocturnal urination.  MS: Negative for joint pain, low back pain.  Derm: Negative for rash or itching.  Neuro: Negative for weakness, abnormal sensation, seizure, frequent headaches, memory loss, confusion.  Psych: Negative for anxiety, depression, suicidal ideation, hallucinations.  Endo: Negative for unusual weight change.  Heme: Negative for bruising or bleeding. Allergy: Negative for rash or hives.    Physical Examination:  BP 159/80 mmHg  Pulse 79  Temp(Src) 97.5 F (36.4 C)  Ht 5\' 4"  (1.626 m)  Wt 251 lb 3.2 oz (113.944 kg)  BMI 43.10 kg/m2   General: Well-nourished, well-developed in no acute distress. Obese. Head: Normocephalic, atraumatic.   Eyes: Conjunctiva pink, no icterus. Mild Eyelids swelling from recent surgery Mouth: Oropharyngeal mucosa moist and pink , no lesions erythema or exudate. Neck: Supple without thyromegaly, masses, or lymphadenopathy.  Lungs: Clear to auscultation bilaterally.  Heart: Regular rate and rhythm, no murmurs rubs or gallops.  Abdomen: Bowel sounds are normal, nontender, nondistended, no hepatosplenomegaly or masses, no abdominal bruits or    hernia , no rebound or guarding.   Rectal: Not performed Extremities: No lower extremity edema. No clubbing or deformities.  Neuro: Alert and oriented x 4 , grossly normal neurologically.  Skin: Warm and dry, no rash or jaundice.   Psych:  Alert and cooperative, normal mood and affect.  Labs: 07/12/15: WBC 4700, hemoglobin 11.9 normal, platelets 148,000 slightly low, BUN 10, creatinine 0.52, total bilirubin 0.6, alkaline phosphatase 63, AST 89, ALT 90, albumin 4.3, cholesterol 160, triglycerides 131, HDL 49 TSH 2.890, vitamin B12 968, hemoglobin A1c 7.4  Imaging Studies: No results found.

## 2015-08-02 NOTE — Patient Instructions (Signed)
1. Please have your labs done. 2. Please have your abdominal ultrasound done. 3. Try taking omeprazole 30 minutes before your meal twice daily. If you continue to have breakthrough heartburn symptoms please let me know.

## 2015-08-02 NOTE — Assessment & Plan Note (Signed)
Breakthrough heartburn on omeprazole 40 mg twice a day. No prior PPIs but currently in the "donut hole" and she does not want to switch her medications. Advised her to take omeprazole 30-60 minutes prior to meals to see if that improved efficacy. If not she'll let us know.

## 2015-08-02 NOTE — Assessment & Plan Note (Signed)
Recent onset abnormal AST/ALT. Suspect fatty liver. We will check for common hereditary disorders, viral hepatitis. Recheck LFTs as well. Baseline abdominal ultrasound.  Instructions for fatty liver discussed with patient.  Recommend 1-2# weight loss per week until ideal body weight through exercise & diet. Low fat/cholesterol diet.   Avoid sweets, sodas, fruit juices, sweetened beverages like tea, etc. Gradually increase exercise from 15 min daily up to 1 hr per day 5 days/week. Limit alcohol use.

## 2015-08-03 LAB — HEPATITIS C ANTIBODY: Hep C Virus Ab: <0.1 s/co ratio (ref 0.0–0.9)

## 2015-08-03 LAB — IGG, IGA, IGM
IgA/Immunoglobulin A, Serum: 158 mg/dL (ref 87–352)
IgG (Immunoglobin G), Serum: 805 mg/dL (ref 700–1600)
IgM (Immunoglobulin M), Srm: 50 mg/dL (ref 26–217)

## 2015-08-03 LAB — ANA: ANA: NEGATIVE

## 2015-08-03 LAB — IRON AND TIBC
IRON: 94 ug/dL (ref 27–159)
Iron Saturation: 26 % (ref 15–55)
Total Iron Binding Capacity: 366 ug/dL (ref 250–450)
UIBC: 272 ug/dL (ref 131–425)

## 2015-08-03 LAB — HEPATITIS A ANTIBODY, TOTAL: Hep A Total Ab: NEGATIVE

## 2015-08-03 LAB — FERRITIN: FERRITIN: 55 ng/mL (ref 15–150)

## 2015-08-03 LAB — HEPATITIS B SURFACE ANTIBODY,QUALITATIVE: Hep B Surface Ab, Qual: NONREACTIVE

## 2015-08-03 LAB — HEPATITIS B SURFACE ANTIGEN: HEP B S AG: NEGATIVE

## 2015-08-03 NOTE — Progress Notes (Signed)
Quick Note:  I added LFTS after patient left yesterday, please call the lab and add them on. Looks like they were not done. Thanks! ______

## 2015-08-04 ENCOUNTER — Ambulatory Visit (HOSPITAL_COMMUNITY)
Admission: RE | Admit: 2015-08-04 | Discharge: 2015-08-04 | Disposition: A | Discharge status: Home / Self Care | Payer: Medicare Other | Source: Ambulatory Visit | Attending: Gastroenterology | Admitting: Gastroenterology

## 2015-08-04 DIAGNOSIS — R7989 Other specified abnormal findings of blood chemistry: Secondary | ICD-10-CM | POA: Insufficient documentation

## 2015-08-04 DIAGNOSIS — R932 Abnormal findings on diagnostic imaging of liver and biliary tract: Secondary | ICD-10-CM | POA: Diagnosis not present

## 2015-08-04 DIAGNOSIS — R945 Abnormal results of liver function studies: Secondary | ICD-10-CM

## 2015-08-05 LAB — HEPATIC FUNCTION PANEL
ALT: 78 IU/L — ABNORMAL HIGH (ref 0–32)
AST: 81 IU/L — ABNORMAL HIGH (ref 0–40)
Albumin: 4.1 g/dL (ref 3.6–4.8)
Alkaline Phosphatase: 57 IU/L (ref 39–117)
BILIRUBIN, DIRECT: 0.1 mg/dL (ref 0.00–0.40)
Bilirubin Total: 0.3 mg/dL (ref 0.0–1.2)
TOTAL PROTEIN: 6.5 g/dL (ref 6.0–8.5)

## 2015-08-09 ENCOUNTER — Telehealth: Payer: Self-pay

## 2015-08-09 NOTE — Telephone Encounter (Signed)
[facility] results placed on Neil [facility] desk for review

## 2015-08-11 ENCOUNTER — Telehealth: Payer: Self-pay

## 2015-08-11 NOTE — Telephone Encounter (Signed)
Lab results from [facility] placed on [facility] desk for review

## 2015-08-18 ENCOUNTER — Encounter: Payer: Self-pay | Admitting: Internal Medicine

## 2015-08-18 ENCOUNTER — Other Ambulatory Visit: Payer: Self-pay | Admitting: Gastroenterology

## 2015-08-18 DIAGNOSIS — R945 Abnormal results of liver function studies: Secondary | ICD-10-CM

## 2015-08-18 DIAGNOSIS — R7989 Other specified abnormal findings of blood chemistry: Secondary | ICD-10-CM

## 2015-08-18 NOTE — Progress Notes (Signed)
Quick Note:  Please let patient know that her labs show persistent but stable AST/ALT even off of statin.  -Suspect NASH. Labs for hereditary conditions, viral hepatitis, autoimmune hepatitis all negative.  -She is not immune to Hep A or B, she should consider vaccination for protective purposes. Can discuss with her PCP. Or we can make arrangements.  -u/s shows fatty liver, no obvious cirrhosis -for now would continue to hold STATINS -recheck LFTs in 3 months. -u/s with elastography in 6 months.(NASH, abnormal LFTs) -return to office in 3 months -send copy of OV note and this information to Dr. Dorris Fetch. Thanks! ______

## 2015-08-18 NOTE — Progress Notes (Signed)
APPOINTMENT MADE FOR 3 MONTHS AND ON RECALL LIST FOR ULTRASOUND

## 2015-08-19 LAB — SPECIMEN STATUS REPORT

## 2015-09-01 ENCOUNTER — Other Ambulatory Visit: Payer: Self-pay | Admitting: Gastroenterology

## 2015-09-01 DIAGNOSIS — R7989 Other specified abnormal findings of blood chemistry: Secondary | ICD-10-CM

## 2015-09-01 DIAGNOSIS — R945 Abnormal results of liver function studies: Secondary | ICD-10-CM

## 2015-09-01 NOTE — Progress Notes (Signed)
Quick Note:  If she has completed Hep A and B vaccines, she is not showing a response to vaccines. ?too early given just completed vaccines?  Please recheck Hep A total Ab, Hep B surface Antibody with LFTs in 3 months. ______

## 2015-09-07 DIAGNOSIS — I10 Essential (primary) hypertension: Secondary | ICD-10-CM | POA: Diagnosis not present

## 2015-09-07 DIAGNOSIS — E119 Type 2 diabetes mellitus without complications: Secondary | ICD-10-CM | POA: Diagnosis not present

## 2015-09-07 DIAGNOSIS — Z79899 Other long term (current) drug therapy: Secondary | ICD-10-CM | POA: Diagnosis not present

## 2015-09-07 DIAGNOSIS — Z8774 Personal history of (corrected) congenital malformations of heart and circulatory system: Secondary | ICD-10-CM | POA: Diagnosis not present

## 2015-09-07 DIAGNOSIS — G245 Blepharospasm: Secondary | ICD-10-CM | POA: Diagnosis not present

## 2015-09-07 DIAGNOSIS — Z7982 Long term (current) use of aspirin: Secondary | ICD-10-CM | POA: Diagnosis not present

## 2015-09-07 DIAGNOSIS — Z7984 Long term (current) use of oral hypoglycemic drugs: Secondary | ICD-10-CM | POA: Diagnosis not present

## 2015-09-14 DIAGNOSIS — G245 Blepharospasm: Secondary | ICD-10-CM | POA: Diagnosis not present

## 2015-09-20 DIAGNOSIS — Z1389 Encounter for screening for other disorder: Secondary | ICD-10-CM | POA: Diagnosis not present

## 2015-09-20 DIAGNOSIS — K589 Irritable bowel syndrome without diarrhea: Secondary | ICD-10-CM | POA: Diagnosis not present

## 2015-10-04 ENCOUNTER — Other Ambulatory Visit (HOSPITAL_COMMUNITY): Payer: Self-pay | Admitting: Family Medicine

## 2015-10-04 ENCOUNTER — Ambulatory Visit (HOSPITAL_COMMUNITY)
Admission: RE | Admit: 2015-10-04 | Discharge: 2015-10-04 | Disposition: A | Discharge status: Home / Self Care | Payer: Medicare Other | Source: Ambulatory Visit | Attending: Family Medicine | Admitting: Family Medicine

## 2015-10-04 DIAGNOSIS — R1011 Right upper quadrant pain: Secondary | ICD-10-CM | POA: Insufficient documentation

## 2015-10-04 DIAGNOSIS — K760 Fatty (change of) liver, not elsewhere classified: Secondary | ICD-10-CM | POA: Diagnosis not present

## 2015-10-04 DIAGNOSIS — Z1389 Encounter for screening for other disorder: Secondary | ICD-10-CM | POA: Diagnosis not present

## 2015-10-04 LAB — POCT I-STAT CREATININE: Creatinine, Ser: 0.6 mg/dL (ref 0.44–1.00)

## 2015-10-04 MED ORDER — IOHEXOL 300 MG/ML  SOLN
100.0000 mL | Freq: Once | INTRAMUSCULAR | Status: AC | PRN
  Administered 2015-10-04: 100 mL via INTRAVENOUS

## 2015-10-04 MED ORDER — DIATRIZOATE MEGLUMINE & SODIUM 66-10 % PO SOLN
ORAL | Status: AC
  Filled 2015-10-04: qty 30

## 2015-10-22 ENCOUNTER — Other Ambulatory Visit: Payer: Self-pay | Admitting: "Endocrinology

## 2015-10-22 DIAGNOSIS — E118 Type 2 diabetes mellitus with unspecified complications: Secondary | ICD-10-CM | POA: Diagnosis not present

## 2015-10-22 DIAGNOSIS — E1165 Type 2 diabetes mellitus with hyperglycemia: Secondary | ICD-10-CM | POA: Diagnosis not present

## 2015-10-22 DIAGNOSIS — E785 Hyperlipidemia, unspecified: Secondary | ICD-10-CM | POA: Diagnosis not present

## 2015-10-23 LAB — BASIC METABOLIC PANEL
BUN / CREAT RATIO: 19 (ref 11–26)
BUN: 11 mg/dL (ref 8–27)
CO2: 27 mmol/L (ref 18–29)
CREATININE: 0.57 mg/dL (ref 0.57–1.00)
Calcium: 9.3 mg/dL (ref 8.7–10.3)
Chloride: 103 mmol/L (ref 96–106)
GFR calc non Af Amer: 100 mL/min/{1.73_m2} (ref >59)
GFR, EST AFRICAN AMERICAN: 116 mL/min/{1.73_m2} (ref >59)
Glucose: 134 mg/dL — ABNORMAL HIGH (ref 65–99)
Potassium: 4.6 mmol/L (ref 3.5–5.2)
Sodium: 146 mmol/L — ABNORMAL HIGH (ref 134–144)

## 2015-10-23 LAB — LIPID PANEL W/O CHOL/HDL RATIO
Cholesterol, Total: 150 mg/dL (ref 100–199)
HDL: 54 mg/dL (ref >39)
LDL CALC: 76 mg/dL (ref 0–99)
Triglycerides: 99 mg/dL (ref 0–149)
VLDL CHOLESTEROL CAL: 20 mg/dL (ref 5–40)

## 2015-10-23 LAB — ALT: ALT: 57 IU/L — AB (ref 0–32)

## 2015-10-23 LAB — AST: AST: 40 IU/L (ref 0–40)

## 2015-10-23 LAB — HGB A1C W/O EAG: HEMOGLOBIN A1C: 6.7 % — AB (ref 4.8–5.6)

## 2015-10-25 ENCOUNTER — Other Ambulatory Visit: Payer: Self-pay

## 2015-10-25 DIAGNOSIS — R945 Abnormal results of liver function studies: Secondary | ICD-10-CM

## 2015-10-25 DIAGNOSIS — R7989 Other specified abnormal findings of blood chemistry: Secondary | ICD-10-CM

## 2015-10-29 ENCOUNTER — Encounter: Payer: Self-pay | Admitting: "Endocrinology

## 2015-10-29 ENCOUNTER — Ambulatory Visit (INDEPENDENT_AMBULATORY_CARE_PROVIDER_SITE_OTHER): Payer: Medicare Other | Admitting: "Endocrinology

## 2015-10-29 VITALS — BP 144/76 | HR 66 | Ht 64.0 in | Wt 248.0 lb

## 2015-10-29 DIAGNOSIS — I10 Essential (primary) hypertension: Secondary | ICD-10-CM

## 2015-10-29 DIAGNOSIS — IMO0002 Reserved for concepts with insufficient information to code with codable children: Secondary | ICD-10-CM

## 2015-10-29 DIAGNOSIS — E1165 Type 2 diabetes mellitus with hyperglycemia: Secondary | ICD-10-CM | POA: Diagnosis not present

## 2015-10-29 DIAGNOSIS — E118 Type 2 diabetes mellitus with unspecified complications: Secondary | ICD-10-CM | POA: Diagnosis not present

## 2015-10-29 DIAGNOSIS — E785 Hyperlipidemia, unspecified: Secondary | ICD-10-CM | POA: Diagnosis not present

## 2015-10-29 NOTE — Patient Instructions (Signed)

## 2015-10-29 NOTE — Progress Notes (Signed)
Subjective:    Patient ID: Aimee Reed, female    DOB: [birth year],    Past Medical History  Diagnosis Date  . GERD (gastroesophageal reflux disease)   . Irritable bowel syndrome   . Overactive bladder   . HTN (hypertension)   . Recurrent UTI     chronic macrodantin  . Blepharospasm   . Anxiety   . Depression   . Diabetes mellitus, type II ([place])   . Hyperlipidemia 03/11/2012   Past Surgical History  Procedure Laterality Date  . Cholecystectomy    . Carpal tunnel release      both hands  . Colonoscopy      Dr. Arnoldo Morale, around 2010  . Esophagogastroduodenoscopy  07/31/2011    [id number] cervical esophageal web s/p dilation,query short segment Barrett's esophagus s/p bx (Barrett's esophagus without dysplasia),small hiatal hernia. 41 French Maloney dilator.  . Colonoscopy N/A 02/25/2013    [id number] polyps, next tcs 02/2018 give tubular adenomas  . Esophagogastroduodenoscopy (egd) with esophageal dilation N/A 02/25/2013    [id number] distal esophagus/HH. next EGD 02/2016 given prior h/o Barrett's  . Eye surgery  November 2016    Eyelids, nerve issues   Social History   Social History  . Marital Status: Married    Spouse Name: N/A  . Number of Children: 2  . Years of Education: N/A   Occupational History  . unemployed    Social History Main Topics  . Smoking status: Never Smoker   . Smokeless tobacco: Never Used  . Alcohol Use: No  . Drug Use: No  . Sexual Activity: Not Asked   Other Topics Concern  . None   Social History Narrative   Outpatient Encounter Prescriptions as of 10/29/2015  Medication Sig  . aspirin EC 81 MG tablet Take 81 mg by mouth daily.    . cetirizine (ZYRTEC) 10 MG tablet Take 10 mg by mouth daily.  . DULoxetine (CYMBALTA) 60 MG capsule 60 mg delayed release capsule; oral every day  . losartan (COZAAR) 100 MG tablet Take 100 mg by mouth daily.  . Melatonin 3 MG TABS Take by mouth at bedtime.  . Multiple Vitamin (MULTIVITAMIN)  capsule Take 1 capsule by mouth daily.  Marland Kitchen omeprazole (PRILOSEC) 40 MG capsule Take 40 mg by mouth 2 (two) times daily.  . propranolol (INDERAL) 10 MG tablet Take 1-2 tablets (10-20 mg total) by mouth 2 (two) times daily. Takes 2 tablets (20mg ) in the mornings and 1 tablet (10mg ) at bedtime  . sitaGLIPtin-metformin (JANUMET) 50-1000 MG tablet Take 1 tablet by mouth 2 (two) times daily with a meal.   No facility-administered encounter medications on file as of 10/29/2015.   ALLERGIES: No Known Allergies VACCINATION STATUS:  There is no immunization history on file for this patient.  Diabetes She presents for her follow-up diabetic visit. She has type 2 diabetes mellitus. Onset time: She was diagnosed at approximate age of 74 years. Her disease course has been improving. There are no hypoglycemic associated symptoms. Pertinent negatives for hypoglycemia include no confusion, headaches, pallor or seizures. Pertinent negatives for diabetes include no chest pain, no polydipsia, no polyphagia and no polyuria. There are no hypoglycemic complications. Symptoms are improving. Risk factors for coronary artery disease include diabetes mellitus, dyslipidemia, hypertension, obesity and sedentary lifestyle. She is compliant with treatment most of the time. Her weight is stable. She is following a generally unhealthy diet. She participates in exercise intermittently. Her overall blood glucose range is 140-180 mg/dl. An ACE inhibitor/angiotensin  II receptor blocker is being taken. Eye exam is current.  Hyperlipidemia This is a chronic problem. The current episode started more than 1 year ago. The problem is controlled. Pertinent negatives include no chest pain, myalgias or shortness of breath. Current antihyperlipidemic treatment includes statins. Risk factors for coronary artery disease include diabetes mellitus, dyslipidemia, hypertension, obesity and a sedentary lifestyle.  Hypertension This is a chronic problem.  The current episode started more than 1 year ago. Pertinent negatives include no chest pain, headaches, palpitations or shortness of breath. Risk factors for coronary artery disease include dyslipidemia, diabetes mellitus and obesity. Past treatments include angiotensin blockers.     Review of Systems  Constitutional: Negative for unexpected weight change.  HENT: Negative for trouble swallowing and voice change.   Eyes: Negative for visual disturbance.  Respiratory: Negative for cough, shortness of breath and wheezing.   Cardiovascular: Negative for chest pain, palpitations and leg swelling.  Gastrointestinal: Negative for nausea, vomiting and diarrhea.  Endocrine: Negative for cold intolerance, heat intolerance, polydipsia, polyphagia and polyuria.  Musculoskeletal: Negative for myalgias and arthralgias.  Skin: Negative for color change, pallor, rash and wound.  Neurological: Negative for seizures and headaches.  Psychiatric/Behavioral: Negative for suicidal ideas and confusion.    Objective:    BP 144/76 mmHg  Pulse 66  Ht 5\' 4"  (1.626 m)  Wt 248 lb (112.492 kg)  BMI 42.55 kg/m2  SpO2 98%  Wt Readings from Last 3 Encounters:  10/29/15 248 lb (112.492 kg)  08/02/15 251 lb 3.2 oz (113.944 kg)  07/29/15 250 lb (113.399 kg)    Physical Exam  Constitutional: She is oriented to person, place, and time. She appears well-developed.  HENT:  Head: Normocephalic and atraumatic.  Eyes: EOM are normal.  She underwent bilateral upper eyelid surgery.  Neck: Normal range of motion. Neck supple. No tracheal deviation present. No thyromegaly present.  Cardiovascular: Normal rate and regular rhythm.   Pulmonary/Chest: Effort normal and breath sounds normal.  Abdominal: Soft. Bowel sounds are normal. There is no tenderness. There is no guarding.  Musculoskeletal: Normal range of motion. She exhibits no edema.  Neurological: She is alert and oriented to person, place, and time. She has normal  reflexes. No cranial nerve deficit. Coordination normal.  Skin: Skin is warm and dry. No rash noted. No erythema. No pallor.  Psychiatric: She has a normal mood and affect. Judgment normal.    Results for orders placed or performed in visit on [id number]  Basic metabolic panel  Result Value Ref Range   Glucose 134 (H) 65 - 99 mg/dL   BUN 11 8 - 27 mg/dL   Creatinine, Ser 0.57 0.57 - 1.00 mg/dL   GFR calc non Af Amer 100 >59 mL/min/1.73   GFR calc Af Amer 116 >59 mL/min/1.73   BUN/Creatinine Ratio 19 11 - 26   Sodium 146 (H) 134 - 144 mmol/L   Potassium 4.6 3.5 - 5.2 mmol/L   Chloride 103 96 - 106 mmol/L   CO2 27 18 - 29 mmol/L   Calcium 9.3 8.7 - 10.3 mg/dL  Lipid Panel w/o Chol/HDL Ratio  Result Value Ref Range   Cholesterol, Total 150 100 - 199 mg/dL   Triglycerides 99 0 - 149 mg/dL   HDL 54 >39 mg/dL   VLDL Cholesterol Cal 20 5 - 40 mg/dL   LDL Calculated 76 0 - 99 mg/dL  Hgb A1c w/o eAG  Result Value Ref Range   Hgb A1c MFr Bld 6.7 (H) 4.8 -  5.6 %  AST  Result Value Ref Range   AST 40 0 - 40 IU/L  ALT  Result Value Ref Range   ALT 57 (H) 0 - 32 IU/L   Complete Blood Count (Most recent): Lab Results  Component Value Date   WBC 8.1 02/18/2011   HGB 13.4 02/18/2011   HCT 40.9 02/18/2011   MCV 91.5 02/18/2011   PLT 233 02/18/2011   Chemistry (most recent): Lab Results  Component Value Date   NA 146* 10/22/2015   K 4.6 10/22/2015   CL 103 10/22/2015   CO2 27 10/22/2015   BUN 11 10/22/2015   CREATININE 0.57 10/22/2015   Diabetic Labs (most recent): Lab Results  Component Value Date   HGBA1C 6.7* 10/22/2015   HGBA1C 7.4* 07/12/2015    Lipid Panel     Component Value Date/Time   CHOL 150 10/22/2015 0725   TRIG 99 10/22/2015 0725   HDL 54 10/22/2015 0725   LDLCALC 76 10/22/2015 0725     Assessment & Plan:   1. Uncontrolled type 2 diabetes mellitus with complication, without long-term current use of insulin ([place])  She  remains at a high risk for  more acute and chronic complications of diabetes which include CAD, CVA, CKD, retinopathy, and neuropathy. These are all discussed in detail with the patient.  Patient came with  improved A1c of 6.7% from  7.4 %.  Glucose logs and insulin administration records pertaining to this visit,  to be scanned into patient's records.  Recent labs reviewed.   - I have re-counseled the patient on diet management and weight loss  by adopting a carbohydrate restricted / protein rich  Diet.  - Suggestion is made for patient to avoid simple carbohydrates   from their diet including Cakes , Desserts, Ice Cream,  Soda (  diet and regular) , Sweet Tea , Candies,  Chips, Cookies, Artificial Sweeteners,   and "Sugar-free" Products .  This will help patient to have stable blood glucose profile and potentially avoid unintended  Weight gain.  - Patient is advised to stick to a routine mealtimes to eat 3 meals  a day and avoid unnecessary snacks ( to snack only to correct hypoglycemia).   - I have approached patient with the following individualized plan to manage diabetes and patient agrees. -Based on her A1c she would require an additional therapy however patient insists that she would do better going forward and wants to avoid it.   - I have counseled the patient on diet and weight management.  -I will continue Janumet 50/1000mg  po BID. I will consider Invokana next visit if she cannot control . -I have emphasized the importance of dietary management to help patient lose more weight.  - Patient specific target  for A1c; LDL, HDL, Triglycerides, and  Waist Circumference were discussed in detail.  2) BP/HTN: controlled. Continue current medications including ACEI/ARB. 3) Lipids/HPL:   Controlled, due to unexplained elevation in transaminases I advised the patient to hold  Statins. She will call her GI doctor for follow-up and address elevated transaminases,  her repeat labs show improved levels of transaminases.   4)  Weight/Diet: exercise, and carbohydrates information provided.  5) Chronic Care/Health Maintenance:  -Patient is on ACEI/ARB medications and encouraged to continue to follow up with Ophthalmology, Podiatrist at least yearly or according to recommendations, and advised to  stay away from smoking. I have recommended yearly flu vaccine and pneumonia vaccination at least every 5 years; moderate intensity exercise  for up to 150 minutes weekly; and  sleep for at least 7 hours a day.  - 25 minutes of time was spent on the care of this patient , 50% of which was applied for counseling on diabetes complications and their preventions.  - I advised patient to maintain close follow up with their PCP for primary care needs.  Patient is asked to bring meter and  blood glucose logs during their next visit.   Follow up plan: -Return in about 3 months (around 01/26/2016) for diabetes, high blood pressure, high cholesterol, follow up with pre-visit labs.  Glade Lloyd, MD Phone: [phone]  Fax: [phone]   10/29/2015, 11:00 AM

## 2015-11-02 DIAGNOSIS — I10 Essential (primary) hypertension: Secondary | ICD-10-CM | POA: Diagnosis not present

## 2015-11-02 DIAGNOSIS — H269 Unspecified cataract: Secondary | ICD-10-CM | POA: Diagnosis not present

## 2015-11-02 DIAGNOSIS — Z7984 Long term (current) use of oral hypoglycemic drugs: Secondary | ICD-10-CM | POA: Diagnosis not present

## 2015-11-02 DIAGNOSIS — Z79899 Other long term (current) drug therapy: Secondary | ICD-10-CM | POA: Diagnosis not present

## 2015-11-02 DIAGNOSIS — R482 Apraxia: Secondary | ICD-10-CM | POA: Diagnosis not present

## 2015-11-02 DIAGNOSIS — E119 Type 2 diabetes mellitus without complications: Secondary | ICD-10-CM | POA: Diagnosis not present

## 2015-11-02 DIAGNOSIS — Z9889 Other specified postprocedural states: Secondary | ICD-10-CM | POA: Diagnosis not present

## 2015-11-02 DIAGNOSIS — Q820 Hereditary lymphedema: Secondary | ICD-10-CM | POA: Diagnosis not present

## 2015-11-02 DIAGNOSIS — H02401 Unspecified ptosis of right eyelid: Secondary | ICD-10-CM | POA: Diagnosis not present

## 2015-11-15 DIAGNOSIS — R188 Other ascites: Secondary | ICD-10-CM | POA: Diagnosis not present

## 2015-11-15 DIAGNOSIS — G245 Blepharospasm: Secondary | ICD-10-CM | POA: Diagnosis not present

## 2015-11-15 DIAGNOSIS — J90 Pleural effusion, not elsewhere classified: Secondary | ICD-10-CM | POA: Diagnosis not present

## 2015-11-16 ENCOUNTER — Ambulatory Visit: Payer: Self-pay | Admitting: Gastroenterology

## 2015-11-30 DIAGNOSIS — H5203 Hypermetropia, bilateral: Secondary | ICD-10-CM | POA: Diagnosis not present

## 2015-11-30 DIAGNOSIS — H52203 Unspecified astigmatism, bilateral: Secondary | ICD-10-CM | POA: Diagnosis not present

## 2016-01-21 DIAGNOSIS — E1165 Type 2 diabetes mellitus with hyperglycemia: Secondary | ICD-10-CM | POA: Diagnosis not present

## 2016-01-21 DIAGNOSIS — E118 Type 2 diabetes mellitus with unspecified complications: Secondary | ICD-10-CM | POA: Diagnosis not present

## 2016-01-22 LAB — BASIC METABOLIC PANEL
BUN / CREAT RATIO: 15 (ref 12–28)
BUN: 10 mg/dL (ref 8–27)
CO2: 26 mmol/L (ref 18–29)
CREATININE: 0.66 mg/dL (ref 0.57–1.00)
Calcium: 9.6 mg/dL (ref 8.7–10.3)
Chloride: 100 mmol/L (ref 96–106)
GFR calc Af Amer: 110 mL/min/{1.73_m2} (ref >59)
GFR, EST NON AFRICAN AMERICAN: 96 mL/min/{1.73_m2} (ref >59)
GLUCOSE: 154 mg/dL — AB (ref 65–99)
Potassium: 3.9 mmol/L (ref 3.5–5.2)
Sodium: 141 mmol/L (ref 134–144)

## 2016-01-22 LAB — HEMOGLOBIN A1C
Est. average glucose Bld gHb Est-mCnc: 163 mg/dL
Hgb A1c MFr Bld: 7.3 % — ABNORMAL HIGH (ref 4.8–5.6)

## 2016-01-25 ENCOUNTER — Telehealth: Payer: Self-pay | Admitting: Internal Medicine

## 2016-01-25 NOTE — Telephone Encounter (Signed)
RECALL FOR ULTRASOUND WITH ELASTO °

## 2016-01-25 NOTE — Telephone Encounter (Signed)
Letter mailed

## 2016-01-28 ENCOUNTER — Ambulatory Visit (INDEPENDENT_AMBULATORY_CARE_PROVIDER_SITE_OTHER): Payer: Medicare Other | Admitting: "Endocrinology

## 2016-01-28 ENCOUNTER — Encounter: Payer: Self-pay | Admitting: "Endocrinology

## 2016-01-28 VITALS — BP 150/71 | HR 70 | Ht 64.0 in | Wt 249.0 lb

## 2016-01-28 DIAGNOSIS — E1165 Type 2 diabetes mellitus with hyperglycemia: Secondary | ICD-10-CM | POA: Diagnosis not present

## 2016-01-28 DIAGNOSIS — I10 Essential (primary) hypertension: Secondary | ICD-10-CM

## 2016-01-28 DIAGNOSIS — IMO0002 Reserved for concepts with insufficient information to code with codable children: Secondary | ICD-10-CM

## 2016-01-28 DIAGNOSIS — E118 Type 2 diabetes mellitus with unspecified complications: Secondary | ICD-10-CM

## 2016-01-28 DIAGNOSIS — E6609 Other obesity due to excess calories: Secondary | ICD-10-CM

## 2016-01-28 DIAGNOSIS — E785 Hyperlipidemia, unspecified: Secondary | ICD-10-CM

## 2016-01-28 MED ORDER — EMPAGLIFLOZIN 10 MG PO TABS: 10.0000 mg | ORAL_TABLET | Freq: Every day | ORAL | Status: DC

## 2016-01-28 NOTE — Progress Notes (Signed)
Subjective:    Patient ID: Aimee Reed, female    DOB: 08/09/1956,    Past Medical History  Diagnosis Date  . GERD (gastroesophageal reflux disease)   . Irritable bowel syndrome   . Overactive bladder   . HTN (hypertension)   . Recurrent UTI     chronic macrodantin  . Blepharospasm   . Anxiety   . Depression   . Diabetes mellitus, type II ([place])   . Hyperlipidemia 03/11/2012   Past Surgical History  Procedure Laterality Date  . Cholecystectomy    . Carpal tunnel release      both hands  . Colonoscopy      Dr. Arnoldo Morale, around 2010  . Esophagogastroduodenoscopy  07/31/2011    [id number] cervical esophageal web s/p dilation,query short segment Barrett's esophagus s/p bx (Barrett's esophagus without dysplasia),small hiatal hernia. 38 French Maloney dilator.  . Colonoscopy N/A 02/25/2013    [id number] polyps, next tcs 02/2018 give tubular adenomas  . Esophagogastroduodenoscopy (egd) with esophageal dilation N/A 02/25/2013    [id number] distal esophagus/HH. next EGD 02/2016 given prior h/o Barrett's  . Eye surgery  November 2016    Eyelids, nerve issues   Social History   Social History  . Marital Status: Married    Spouse Name: N/A  . Number of Children: 2  . Years of Education: N/A   Occupational History  . unemployed    Social History Main Topics  . Smoking status: Never Smoker   . Smokeless tobacco: Never Used  . Alcohol Use: No  . Drug Use: No  . Sexual Activity: Not Asked   Other Topics Concern  . None   Social History Narrative   Outpatient Encounter Prescriptions as of 01/28/2016  Medication Sig  . aspirin EC 81 MG tablet Take 81 mg by mouth daily.    . cetirizine (ZYRTEC) 10 MG tablet Take 10 mg by mouth daily.  . DULoxetine (CYMBALTA) 60 MG capsule 60 mg delayed release capsule; oral every day  . empagliflozin (JARDIANCE) 10 MG TABS tablet Take 10 mg by mouth daily.  Marland Kitchen losartan (COZAAR) 100 MG tablet Take 100 mg by mouth daily.  . Melatonin 3  MG TABS Take by mouth at bedtime.  . Multiple Vitamin (MULTIVITAMIN) capsule Take 1 capsule by mouth daily.  Marland Kitchen omeprazole (PRILOSEC) 40 MG capsule Take 40 mg by mouth 2 (two) times daily.  . propranolol (INDERAL) 10 MG tablet Take 1-2 tablets (10-20 mg total) by mouth 2 (two) times daily. Takes 2 tablets (20mg ) in the mornings and 1 tablet (10mg ) at bedtime  . sitaGLIPtin-metformin (JANUMET) 50-1000 MG tablet Take 1 tablet by mouth 2 (two) times daily with a meal.   No facility-administered encounter medications on file as of 01/28/2016.   ALLERGIES: No Known Allergies VACCINATION STATUS:  There is no immunization history on file for this patient.  Diabetes She presents for her follow-up diabetic visit. She has type 2 diabetes mellitus. Onset time: She was diagnosed at approximate age of 71 years. Her disease course has been worsening. There are no hypoglycemic associated symptoms. Pertinent negatives for hypoglycemia include no confusion, headaches, pallor or seizures. Pertinent negatives for diabetes include no chest pain, no polydipsia, no polyphagia and no polyuria. There are no hypoglycemic complications. Symptoms are worsening. Risk factors for coronary artery disease include diabetes mellitus, dyslipidemia, hypertension, obesity and sedentary lifestyle. She is compliant with treatment most of the time. Her weight is stable. She is following a generally unhealthy diet. She participates  in exercise intermittently. Her overall blood glucose range is 140-180 mg/dl. An ACE inhibitor/angiotensin II receptor blocker is being taken. Eye exam is current.  Hyperlipidemia This is a chronic problem. The current episode started more than 1 year ago. The problem is controlled. Pertinent negatives include no chest pain, myalgias or shortness of breath. Current antihyperlipidemic treatment includes statins. Risk factors for coronary artery disease include diabetes mellitus, dyslipidemia, hypertension, obesity  and a sedentary lifestyle.  Hypertension This is a chronic problem. The current episode started more than 1 year ago. Pertinent negatives include no chest pain, headaches, palpitations or shortness of breath. Risk factors for coronary artery disease include dyslipidemia, diabetes mellitus and obesity. Past treatments include angiotensin blockers.     Review of Systems  Constitutional: Negative for unexpected weight change.  HENT: Negative for trouble swallowing and voice change.   Eyes: Negative for visual disturbance.  Respiratory: Negative for cough, shortness of breath and wheezing.   Cardiovascular: Negative for chest pain, palpitations and leg swelling.  Gastrointestinal: Negative for nausea, vomiting and diarrhea.  Endocrine: Negative for cold intolerance, heat intolerance, polydipsia, polyphagia and polyuria.  Musculoskeletal: Negative for myalgias and arthralgias.  Skin: Negative for color change, pallor, rash and wound.  Neurological: Negative for seizures and headaches.  Psychiatric/Behavioral: Negative for suicidal ideas and confusion.    Objective:    BP 150/71 mmHg  Pulse 70  Ht 5\' 4"  (1.626 m)  Wt 249 lb (112.946 kg)  BMI 42.72 kg/m2  SpO2 97%  Wt Readings from Last 3 Encounters:  01/28/16 249 lb (112.946 kg)  10/29/15 248 lb (112.492 kg)  08/02/15 251 lb 3.2 oz (113.944 kg)    Physical Exam  Constitutional: She is oriented to person, place, and time. She appears well-developed.  HENT:  Head: Normocephalic and atraumatic.  Eyes: EOM are normal.  She underwent bilateral upper eyelid surgery.  Neck: Normal range of motion. Neck supple. No tracheal deviation present. No thyromegaly present.  Cardiovascular: Normal rate and regular rhythm.   Pulmonary/Chest: Effort normal and breath sounds normal.  Abdominal: Soft. Bowel sounds are normal. There is no tenderness. There is no guarding.  Musculoskeletal: Normal range of motion. She exhibits no edema.  Neurological:  She is alert and oriented to person, place, and time. She has normal reflexes. No cranial nerve deficit. Coordination normal.  Skin: Skin is warm and dry. No rash noted. No erythema. No pallor.  Psychiatric: She has a normal mood and affect. Judgment normal.    Results for orders placed or performed in visit on 10/29/15  Hemoglobin A1c  Result Value Ref Range   Hgb A1c MFr Bld 7.3 (H) 4.8 - 5.6 %   Est. average glucose Bld gHb Est-mCnc 163 mg/dL  Basic metabolic panel  Result Value Ref Range   Glucose 154 (H) 65 - 99 mg/dL   BUN 10 8 - 27 mg/dL   Creatinine, Ser 0.66 0.57 - 1.00 mg/dL   GFR calc non Af Amer 96 >59 mL/min/1.73   GFR calc Af Amer 110 >59 mL/min/1.73   BUN/Creatinine Ratio 15 12 - 28   Sodium 141 134 - 144 mmol/L   Potassium 3.9 3.5 - 5.2 mmol/L   Chloride 100 96 - 106 mmol/L   CO2 26 18 - 29 mmol/L   Calcium 9.6 8.7 - 10.3 mg/dL   Complete Blood Count (Most recent): Lab Results  Component Value Date   WBC 8.1 02/18/2011   HGB 13.4 02/18/2011   HCT 40.9 02/18/2011  MCV 91.5 02/18/2011   PLT 233 02/18/2011   Chemistry (most recent): Lab Results  Component Value Date   NA 141 01/21/2016   K 3.9 01/21/2016   CL 100 01/21/2016   CO2 26 01/21/2016   BUN 10 01/21/2016   CREATININE 0.66 01/21/2016   Diabetic Labs (most recent): Lab Results  Component Value Date   HGBA1C 7.3* 01/21/2016   HGBA1C 6.7* 10/22/2015   HGBA1C 7.4* 07/12/2015    Lipid Panel     Component Value Date/Time   CHOL 150 10/22/2015 0725   TRIG 99 10/22/2015 0725   HDL 54 10/22/2015 0725   LDLCALC 76 10/22/2015 0725     Assessment & Plan:   1. Uncontrolled type 2 diabetes mellitus with complication, without long-term current use of insulin ([place])  She  remains at a high risk for more acute and chronic complications of diabetes which include CAD, CVA, CKD, retinopathy, and neuropathy. These are all discussed in detail with the patient.  Patient came with  improved A1c of  7.3%  increasing from 6.7% .  Recent labs reviewed, showing normal renal function.   - I have re-counseled the patient on diet management and weight loss  by adopting a carbohydrate restricted / protein rich  Diet.  - Suggestion is made for patient to avoid simple carbohydrates   from their diet including Cakes , Desserts, Ice Cream,  Soda (  diet and regular) , Sweet Tea , Candies,  Chips, Cookies, Artificial Sweeteners,   and "Sugar-free" Products .  This will help patient to have stable blood glucose profile and potentially avoid unintended  Weight gain.  - Patient is advised to stick to a routine mealtimes to eat 3 meals  a day and avoid unnecessary snacks ( to snack only to correct hypoglycemia).   - I have approached patient with the following individualized plan to manage diabetes and patient agrees. -Based on her A1c she would require an additional therapy however patient insists that she would do better going forward and wants to avoid it.   - I have counseled the patient on diet and weight management.  -I will continue Janumet 50/1000mg  po BID. - To help her achieve better control, I will add Jardiance 10 milligrams by mouth every morning. Side effects and precautions discussed with her.  -I have emphasized the importance of dietary management to help patient lose more weight.  - Patient specific target  for A1c; LDL, HDL, Triglycerides, and  Waist Circumference were discussed in detail.  2) BP/HTN: controlled. Continue current medications including ACEI/ARB. 3) Lipids/HPL:   Controlled, due to unexplained elevation in transaminases I advised the patient to hold  Statins. She will call her GI doctor for follow-up and address elevated transaminases,  her repeat labs show improved levels of transaminases.  4)  Weight/Diet: exercise, and carbohydrates information provided.  5) Chronic Care/Health Maintenance:  -Patient is on ACEI/ARB medications and encouraged to continue to follow up  with Ophthalmology, Podiatrist at least yearly or according to recommendations, and advised to  stay away from smoking. I have recommended yearly flu vaccine and pneumonia vaccination at least every 5 years; moderate intensity exercise for up to 150 minutes weekly; and  sleep for at least 7 hours a day.  - 25 minutes of time was spent on the care of this patient , 50% of which was applied for counseling on diabetes complications and their preventions.  - I advised patient to maintain close follow up with their PCP  for primary care needs.  Patient is asked to bring meter and  blood glucose logs during their next visit.   Follow up plan: -Return in about 3 months (around 04/29/2016) for diabetes, high blood pressure, follow up with pre-visit labs, high cholesterol.  Glade Lloyd, MD Phone: [phone]  Fax: [phone]   01/28/2016, 11:20 AM

## 2016-02-14 DIAGNOSIS — G245 Blepharospasm: Secondary | ICD-10-CM | POA: Diagnosis not present

## 2016-02-17 ENCOUNTER — Encounter: Payer: Self-pay | Admitting: Internal Medicine

## 2016-03-21 DIAGNOSIS — I10 Essential (primary) hypertension: Secondary | ICD-10-CM | POA: Diagnosis not present

## 2016-03-21 DIAGNOSIS — G245 Blepharospasm: Secondary | ICD-10-CM | POA: Diagnosis not present

## 2016-03-21 DIAGNOSIS — E119 Type 2 diabetes mellitus without complications: Secondary | ICD-10-CM | POA: Diagnosis not present

## 2016-03-21 DIAGNOSIS — H02401 Unspecified ptosis of right eyelid: Secondary | ICD-10-CM | POA: Diagnosis not present

## 2016-03-21 DIAGNOSIS — Z79899 Other long term (current) drug therapy: Secondary | ICD-10-CM | POA: Diagnosis not present

## 2016-03-21 DIAGNOSIS — Z7982 Long term (current) use of aspirin: Secondary | ICD-10-CM | POA: Diagnosis not present

## 2016-03-21 DIAGNOSIS — R482 Apraxia: Secondary | ICD-10-CM | POA: Diagnosis not present

## 2016-04-17 DIAGNOSIS — H02401 Unspecified ptosis of right eyelid: Secondary | ICD-10-CM | POA: Diagnosis not present

## 2016-04-17 DIAGNOSIS — R482 Apraxia: Secondary | ICD-10-CM | POA: Diagnosis not present

## 2016-04-17 DIAGNOSIS — H02411 Mechanical ptosis of right eyelid: Secondary | ICD-10-CM | POA: Diagnosis not present

## 2016-04-25 DIAGNOSIS — I10 Essential (primary) hypertension: Secondary | ICD-10-CM | POA: Diagnosis not present

## 2016-04-25 DIAGNOSIS — Z48810 Encounter for surgical aftercare following surgery on the sense organs: Secondary | ICD-10-CM | POA: Diagnosis not present

## 2016-04-25 DIAGNOSIS — Z7982 Long term (current) use of aspirin: Secondary | ICD-10-CM | POA: Diagnosis not present

## 2016-04-25 DIAGNOSIS — Z79899 Other long term (current) drug therapy: Secondary | ICD-10-CM | POA: Diagnosis not present

## 2016-04-25 DIAGNOSIS — Z7984 Long term (current) use of oral hypoglycemic drugs: Secondary | ICD-10-CM | POA: Diagnosis not present

## 2016-04-25 DIAGNOSIS — E1136 Type 2 diabetes mellitus with diabetic cataract: Secondary | ICD-10-CM | POA: Diagnosis not present

## 2016-05-06 IMAGING — US US ABDOMEN COMPLETE
1 series · 14 of 25 positions shown · non-contrast
Comparison: 07/26/2010

CLINICAL DATA: Abnormal LFTs.

EXAM:
ULTRASOUND ABDOMEN COMPLETE

[Series 1: us abdomen complete · 0.30mm/px · 14 of 102 slices shown]
[im 1/102]
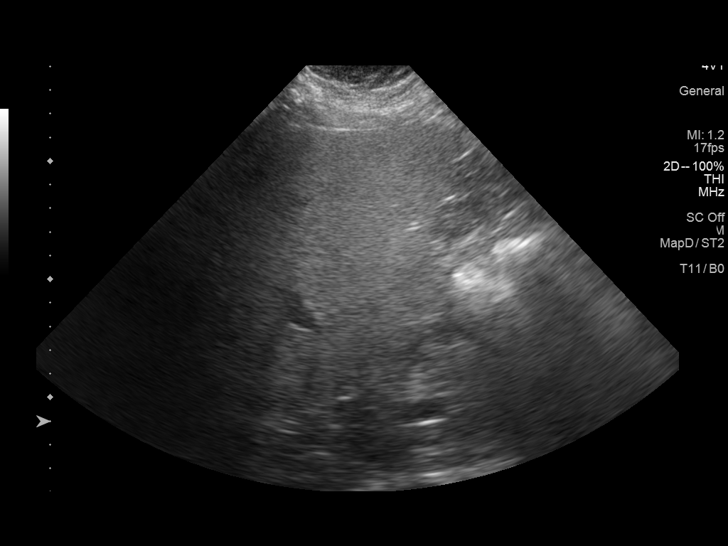
[im 9/102]
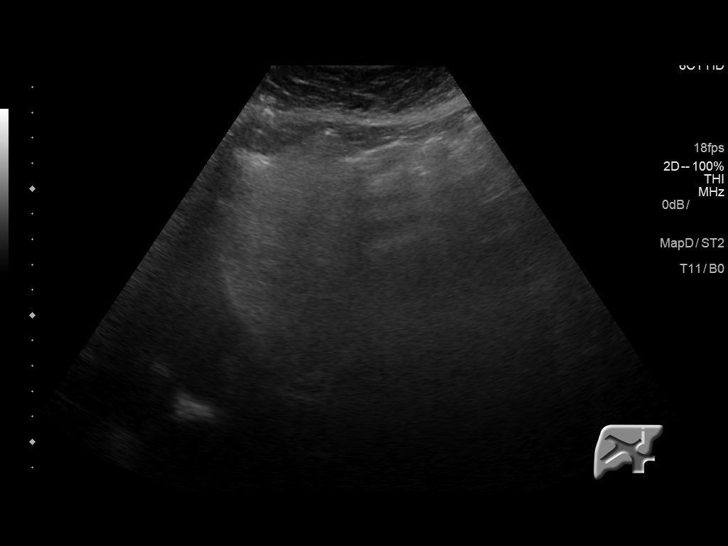
[im 17/102]
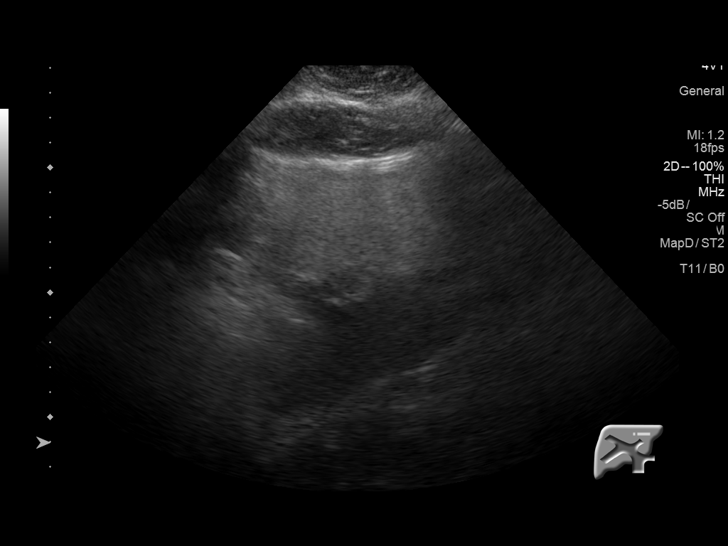
[im 26/102]
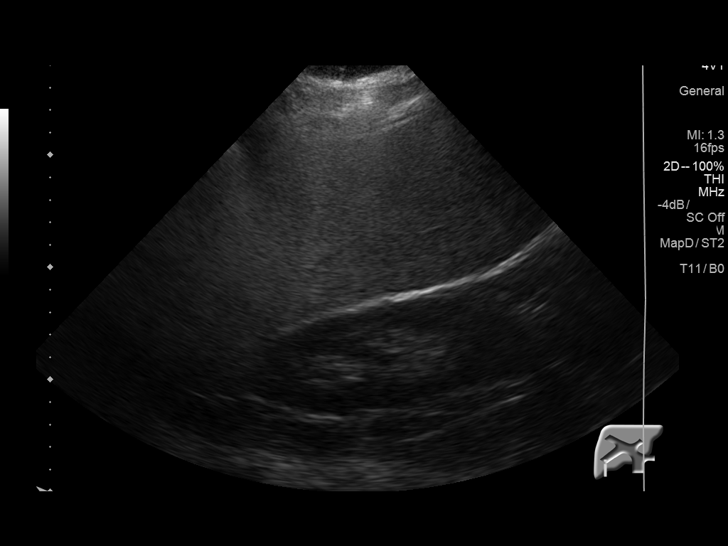
[im 34/102]
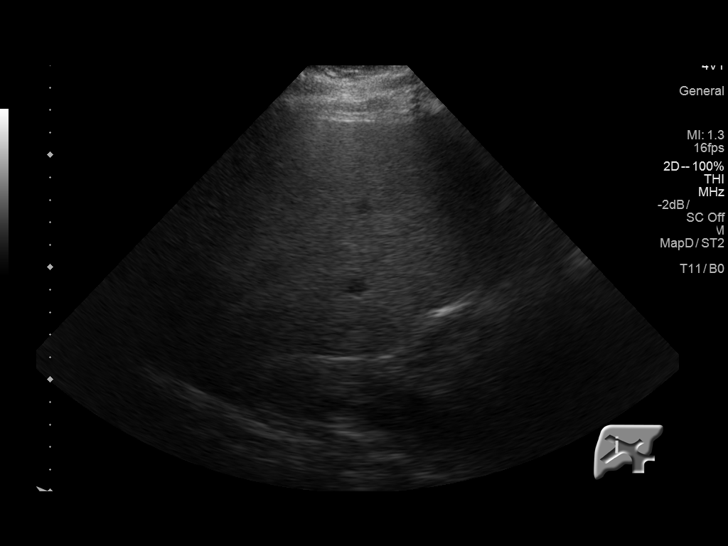
[im 38/102]
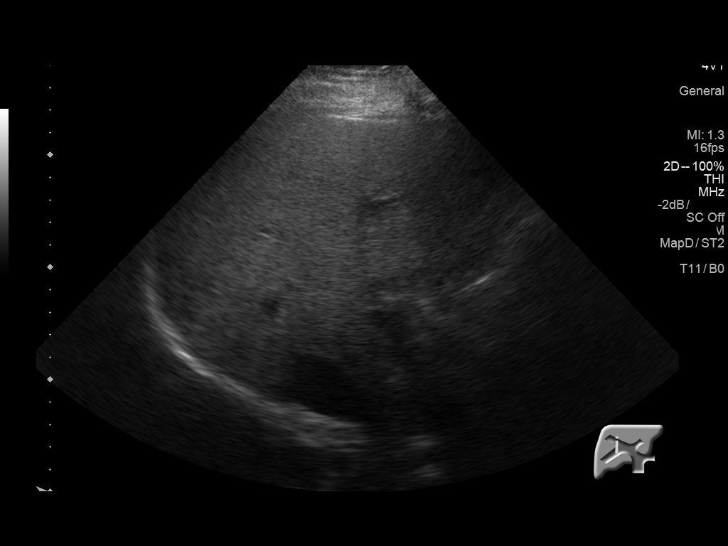
[im 47/102]
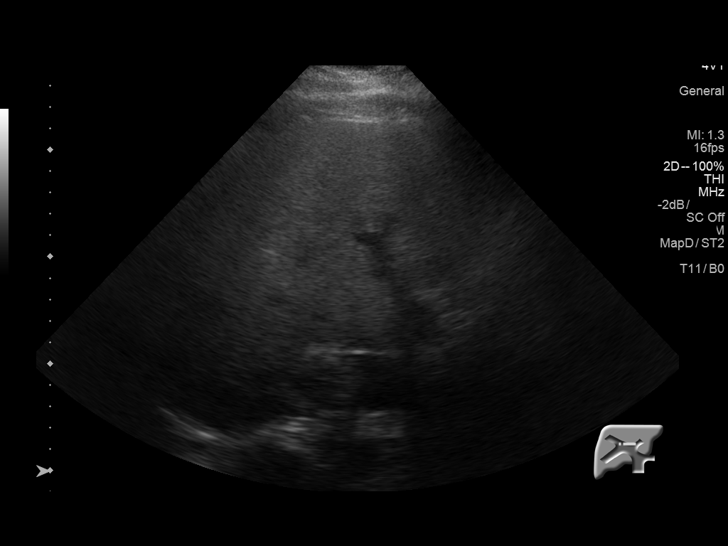
[im 55/102]
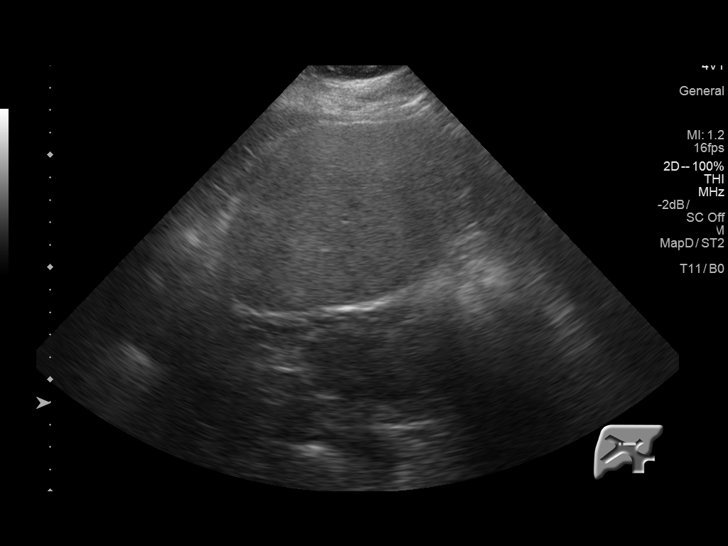
[im 64/102]
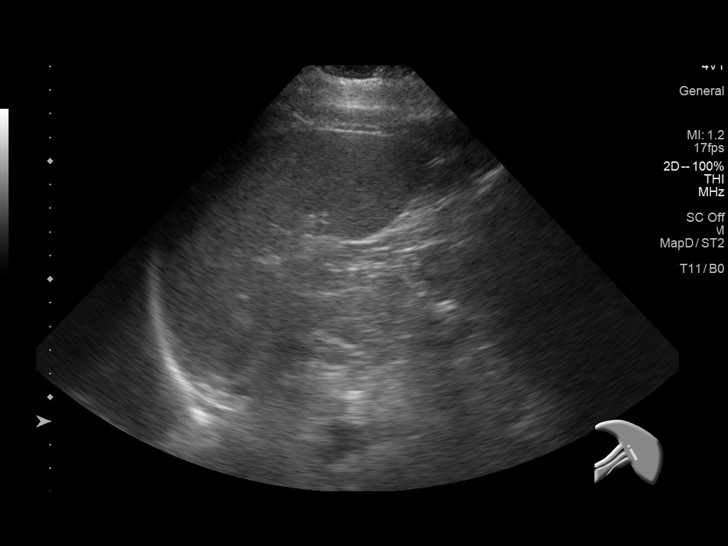
[im 68/102]
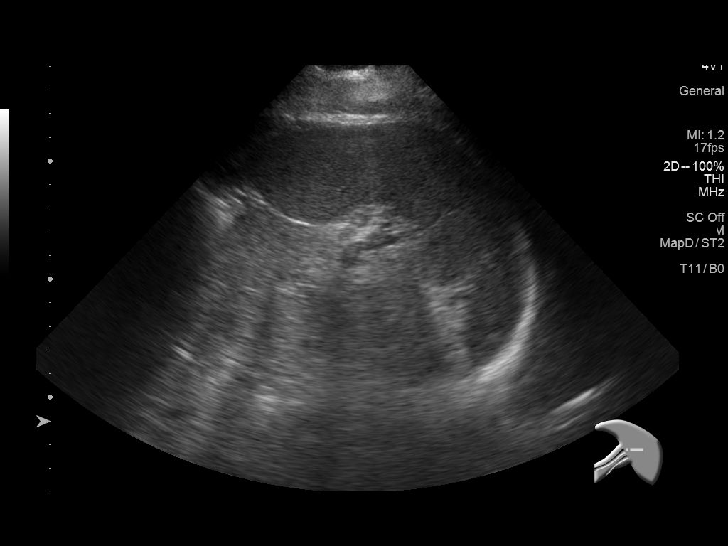
[im 76/102]
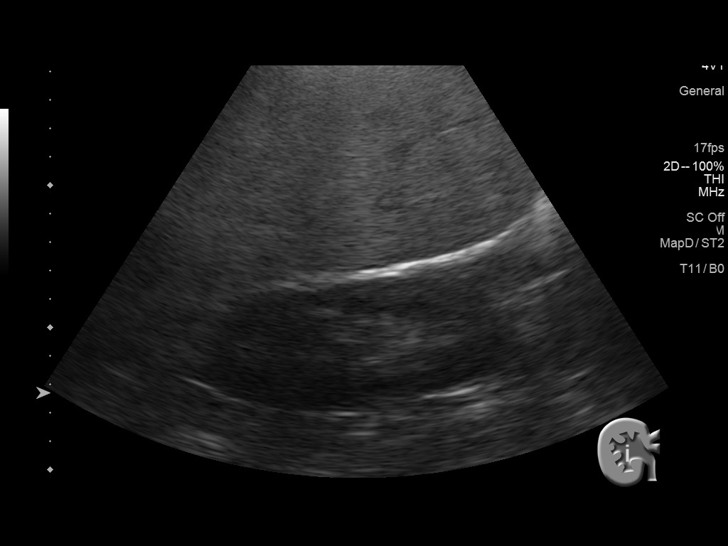
[im 85/102]
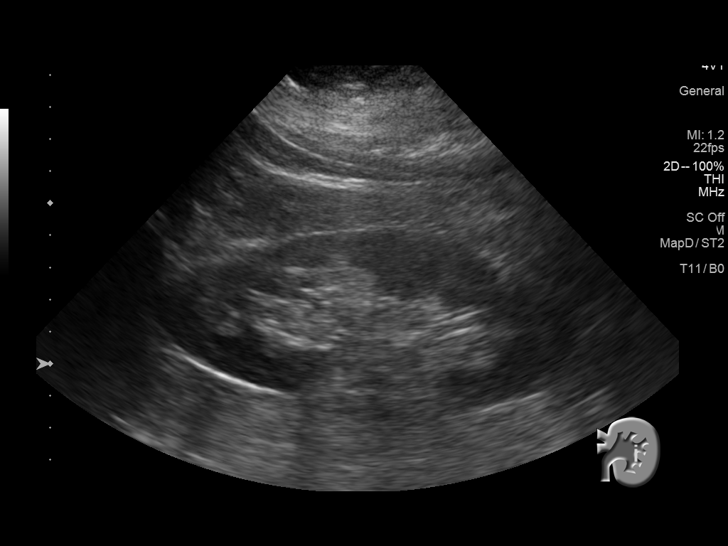
[im 93/102]
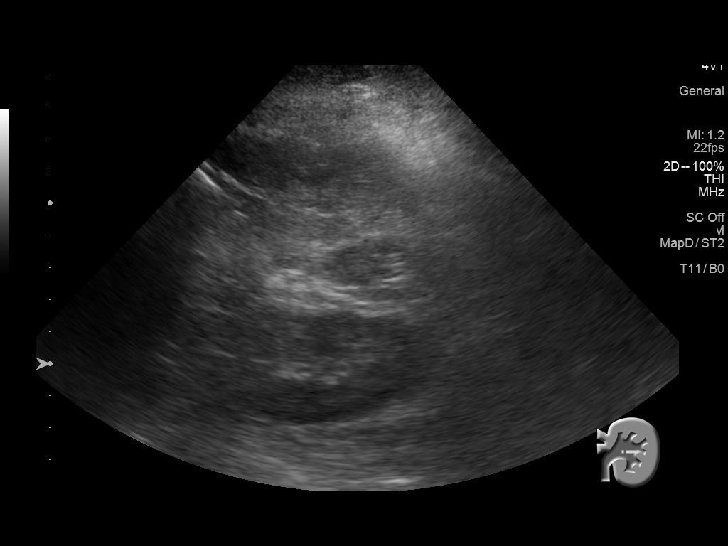
[im 102/102]
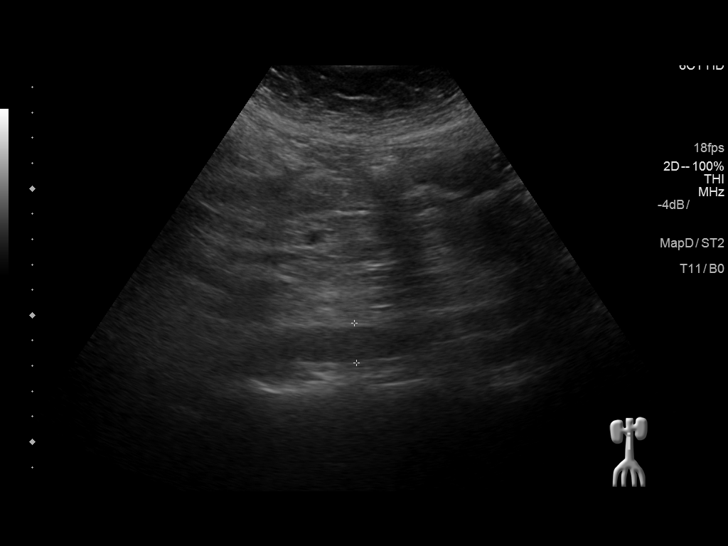

[14 of 25 positions shown; findings below may reference images not displayed]

FINDINGS: Gallbladder: Previous cholecystectomy.

Common bile duct: Diameter: 4.6 mm.

Liver: Diffusely echogenic.

IVC: No abnormality visualized.

Pancreas: Diminished exam detail.

Spleen: 11.8 cm.

Right Kidney: Length: 13.9 cm. Echogenicity within normal limits. No
mass or hydronephrosis visualized.

Left Kidney: Length: 13.9 cm. Echogenicity within normal limits. No
mass or hydronephrosis visualized.

Abdominal aorta: No aneurysm visualized.

Other findings: Exam detail diminished due to overlying bowel gas
and patient's body habitus.
IMPRESSION: 1. Echogenic liver compatible with hepatic steatosis.

## 2016-05-10 DIAGNOSIS — E782 Mixed hyperlipidemia: Secondary | ICD-10-CM | POA: Diagnosis not present

## 2016-05-10 DIAGNOSIS — G4733 Obstructive sleep apnea (adult) (pediatric): Secondary | ICD-10-CM | POA: Diagnosis not present

## 2016-05-10 DIAGNOSIS — I10 Essential (primary) hypertension: Secondary | ICD-10-CM | POA: Diagnosis not present

## 2016-05-10 DIAGNOSIS — Z1389 Encounter for screening for other disorder: Secondary | ICD-10-CM | POA: Diagnosis not present

## 2016-05-10 DIAGNOSIS — E1165 Type 2 diabetes mellitus with hyperglycemia: Secondary | ICD-10-CM | POA: Diagnosis not present

## 2016-05-11 DIAGNOSIS — G245 Blepharospasm: Secondary | ICD-10-CM | POA: Diagnosis not present

## 2016-05-12 DIAGNOSIS — I10 Essential (primary) hypertension: Secondary | ICD-10-CM | POA: Diagnosis not present

## 2016-05-12 DIAGNOSIS — Z1389 Encounter for screening for other disorder: Secondary | ICD-10-CM | POA: Diagnosis not present

## 2016-05-12 DIAGNOSIS — E118 Type 2 diabetes mellitus with unspecified complications: Secondary | ICD-10-CM | POA: Diagnosis not present

## 2016-05-12 DIAGNOSIS — E1165 Type 2 diabetes mellitus with hyperglycemia: Secondary | ICD-10-CM | POA: Diagnosis not present

## 2016-05-12 DIAGNOSIS — E782 Mixed hyperlipidemia: Secondary | ICD-10-CM | POA: Diagnosis not present

## 2016-05-13 LAB — CMP14+EGFR
ALT: 71 IU/L — AB (ref 0–32)
AST: 56 IU/L — AB (ref 0–40)
Albumin/Globulin Ratio: 1.8 (ref 1.2–2.2)
Albumin: 4.4 g/dL (ref 3.6–4.8)
Alkaline Phosphatase: 65 IU/L (ref 39–117)
BILIRUBIN TOTAL: 0.5 mg/dL (ref 0.0–1.2)
BUN / CREAT RATIO: 15 (ref 12–28)
BUN: 8 mg/dL (ref 8–27)
CO2: 22 mmol/L (ref 18–29)
Calcium: 9.4 mg/dL (ref 8.7–10.3)
Chloride: 103 mmol/L (ref 96–106)
Creatinine, Ser: 0.53 mg/dL — ABNORMAL LOW (ref 0.57–1.00)
GFR calc Af Amer: 119 mL/min/{1.73_m2} (ref >59)
GFR, EST NON AFRICAN AMERICAN: 103 mL/min/{1.73_m2} (ref >59)
GLUCOSE: 173 mg/dL — AB (ref 65–99)
Globulin, Total: 2.5 g/dL (ref 1.5–4.5)
POTASSIUM: 4.3 mmol/L (ref 3.5–5.2)
SODIUM: 143 mmol/L (ref 134–144)
TOTAL PROTEIN: 6.9 g/dL (ref 6.0–8.5)

## 2016-05-13 LAB — HEMOGLOBIN A1C
ESTIMATED AVERAGE GLUCOSE: 151 mg/dL
HEMOGLOBIN A1C: 6.9 % — AB (ref 4.8–5.6)

## 2016-05-22 ENCOUNTER — Ambulatory Visit (INDEPENDENT_AMBULATORY_CARE_PROVIDER_SITE_OTHER): Payer: Medicare Other | Admitting: "Endocrinology

## 2016-05-22 ENCOUNTER — Encounter: Payer: Self-pay | Admitting: "Endocrinology

## 2016-05-22 VITALS — BP 143/75 | HR 68 | Ht 64.0 in | Wt 242.6 lb

## 2016-05-22 DIAGNOSIS — E785 Hyperlipidemia, unspecified: Secondary | ICD-10-CM

## 2016-05-22 DIAGNOSIS — E118 Type 2 diabetes mellitus with unspecified complications: Secondary | ICD-10-CM

## 2016-05-22 DIAGNOSIS — E6609 Other obesity due to excess calories: Secondary | ICD-10-CM

## 2016-05-22 DIAGNOSIS — I10 Essential (primary) hypertension: Secondary | ICD-10-CM | POA: Diagnosis not present

## 2016-05-22 DIAGNOSIS — E1165 Type 2 diabetes mellitus with hyperglycemia: Secondary | ICD-10-CM | POA: Diagnosis not present

## 2016-05-22 DIAGNOSIS — IMO0002 Reserved for concepts with insufficient information to code with codable children: Secondary | ICD-10-CM

## 2016-05-22 NOTE — Progress Notes (Signed)
Subjective:    Patient ID: Aimee Reed, female    DOB: [birth year],    Past Medical History:  Diagnosis Date  . Anxiety   . Blepharospasm   . Depression   . Diabetes mellitus, type II ([place])   . GERD (gastroesophageal reflux disease)   . HTN (hypertension)   . Hyperlipidemia 03/11/2012  . Irritable bowel syndrome   . Overactive bladder   . Recurrent UTI    chronic macrodantin   Past Surgical History:  Procedure Laterality Date  . CARPAL TUNNEL RELEASE     both hands  . CHOLECYSTECTOMY    . COLONOSCOPY     Dr. Arnoldo Morale, around 2010  . COLONOSCOPY N/A 02/25/2013   [id number] polyps, next tcs 02/2018 give tubular adenomas  . ESOPHAGOGASTRODUODENOSCOPY  07/31/2011   [id number] cervical esophageal web s/p dilation,query short segment Barrett's esophagus s/p bx (Barrett's esophagus without dysplasia),small hiatal hernia. 59 French Maloney dilator.  . ESOPHAGOGASTRODUODENOSCOPY (EGD) WITH ESOPHAGEAL DILATION N/A 02/25/2013   [id number] distal esophagus/HH. next EGD 02/2016 given prior h/o Barrett's  . EYE SURGERY  November 2016   Eyelids, nerve issues   Social History   Social History  . Marital status: Married    Spouse name: N/A  . Number of children: 2  . Years of education: N/A   Occupational History  . unemployed Unemployed   Social History Main Topics  . Smoking status: Never Smoker  . Smokeless tobacco: Never Used  . Alcohol use No  . Drug use: No  . Sexual activity: Not Asked   Other Topics Concern  . None   Social History Narrative  . None   Outpatient Encounter Prescriptions as of 05/22/2016  Medication Sig  . aspirin EC 81 MG tablet Take 81 mg by mouth daily.    . cetirizine (ZYRTEC) 10 MG tablet Take 10 mg by mouth daily.  . DULoxetine (CYMBALTA) 60 MG capsule 60 mg delayed release capsule; oral every day  . empagliflozin (JARDIANCE) 10 MG TABS tablet Take 10 mg by mouth daily.  Marland Kitchen losartan (COZAAR) 100 MG tablet Take 100 mg by mouth daily.  .  Melatonin 3 MG TABS Take by mouth at bedtime.  Marland Kitchen omeprazole (PRILOSEC) 40 MG capsule Take 40 mg by mouth 2 (two) times daily.  . propranolol (INDERAL) 10 MG tablet Take 1-2 tablets (10-20 mg total) by mouth 2 (two) times daily. Takes 2 tablets (74m) in the mornings and 1 tablet (168m at bedtime  . sitaGLIPtin-metformin (JANUMET) 50-1000 MG tablet Take 1 tablet by mouth 2 (two) times daily with a meal.  . [DISCONTINUED] Multiple Vitamin (MULTIVITAMIN) capsule Take 1 capsule by mouth daily.   No facility-administered encounter medications on file as of 05/22/2016.    ALLERGIES: No Known Allergies VACCINATION STATUS:  There is no immunization history on file for this patient.  Diabetes  She presents for her follow-up diabetic visit. She has type 2 diabetes mellitus. Onset time: She was diagnosed at approximate age of [age]. Her disease course has been improving. There are no hypoglycemic associated symptoms. Pertinent negatives for hypoglycemia include no confusion, headaches, pallor or seizures. Pertinent negatives for diabetes include no chest pain, no polydipsia, no polyphagia and no polyuria. There are no hypoglycemic complications. Symptoms are improving. Risk factors for coronary artery disease include diabetes mellitus, dyslipidemia, hypertension, obesity and sedentary lifestyle. She is compliant with treatment most of the time. Her weight is stable. She is following a generally unhealthy diet. She participates in exercise  intermittently. An ACE inhibitor/angiotensin II receptor blocker is being taken. Eye exam is current.  Hyperlipidemia  This is a chronic problem. The current episode started more than 1 year ago. The problem is controlled. Pertinent negatives include no chest pain, myalgias or shortness of breath. Current antihyperlipidemic treatment includes statins. Risk factors for coronary artery disease include diabetes mellitus, dyslipidemia, hypertension, obesity and a sedentary  lifestyle.  Hypertension  This is a chronic problem. The current episode started more than 1 year ago. Pertinent negatives include no chest pain, headaches, palpitations or shortness of breath. Risk factors for coronary artery disease include dyslipidemia, diabetes mellitus and obesity. Past treatments include angiotensin blockers.     Review of Systems  Constitutional: Negative for unexpected weight change.  HENT: Negative for trouble swallowing and voice change.   Eyes: Negative for visual disturbance.  Respiratory: Negative for cough, shortness of breath and wheezing.   Cardiovascular: Negative for chest pain, palpitations and leg swelling.  Gastrointestinal: Negative for diarrhea, nausea and vomiting.  Endocrine: Negative for cold intolerance, heat intolerance, polydipsia, polyphagia and polyuria.  Musculoskeletal: Negative for arthralgias and myalgias.  Skin: Negative for color change, pallor, rash and wound.  Neurological: Negative for seizures and headaches.  Psychiatric/Behavioral: Negative for confusion and suicidal ideas.    Objective:    BP (!) 143/75   Pulse 68   Ht _0  (1.626 m)   Wt 242 lb 9.6 oz (110 kg)   BMI 41.64 kg/m   Wt Readings from Last 3 Encounters:  05/22/16 242 lb 9.6 oz (110 kg)  01/28/16 249 lb (112.9 kg)  10/29/15 248 lb (112.5 kg)    Physical Exam  Constitutional: She is oriented to person, place, and time. She appears well-developed.  HENT:  Head: Normocephalic and atraumatic.  Eyes: EOM are normal.  She underwent bilateral upper eyelid surgery.  Neck: Normal range of motion. Neck supple. No tracheal deviation present. No thyromegaly present.  Cardiovascular: Normal rate and regular rhythm.   Pulmonary/Chest: Effort normal and breath sounds normal.  Abdominal: Soft. Bowel sounds are normal. There is no tenderness. There is no guarding.  Musculoskeletal: Normal range of motion. She exhibits no edema.  Neurological: She is alert and oriented  to person, place, and time. She has normal reflexes. No cranial nerve deficit. Coordination normal.  Skin: Skin is warm and dry. No rash noted. No erythema. No pallor.  Psychiatric: She has a normal mood and affect. Judgment normal.    Results for orders placed or performed in visit on 01/28/16  Hemoglobin A1c  Result Value Ref Range   Hgb A1c MFr Bld 6.9 (H) 4.8 - 5.6 %   Est. average glucose Bld gHb Est-mCnc 151 mg/dL  CMP14+EGFR  Result Value Ref Range   Glucose 173 (H) 65 - 99 mg/dL   BUN 8 8 - 27 mg/dL   Creatinine, Ser 0.53 (L) 0.57 - 1.00 mg/dL   GFR calc non Af Amer 103 >59 mL/min/1.73   GFR calc Af Amer 119 >59 mL/min/1.73   BUN/Creatinine Ratio 15 12 - 28   Sodium 143 134 - 144 mmol/L   Potassium 4.3 3.5 - 5.2 mmol/L   Chloride 103 96 - 106 mmol/L   CO2 22 18 - 29 mmol/L   Calcium 9.4 8.7 - 10.3 mg/dL   Total Protein 6.9 6.0 - 8.5 g/dL   Albumin 4.4 3.6 - 4.8 g/dL   Globulin, Total 2.5 1.5 - 4.5 g/dL   Albumin/Globulin Ratio 1.8 1.2 - 2.2  Bilirubin Total 0.5 0.0 - 1.2 mg/dL   Alkaline Phosphatase 65 39 - 117 IU/L   AST 56 (H) 0 - 40 IU/L   ALT 71 (H) 0 - 32 IU/L   Complete Blood Count (Most recent): Lab Results  Component Value Date   WBC 8.1 02/18/2011   HGB 13.4 02/18/2011   HCT 40.9 02/18/2011   MCV 91.5 02/18/2011   PLT 233 02/18/2011   Chemistry (most recent): Lab Results  Component Value Date   NA 143 05/12/2016   K 4.3 05/12/2016   CL 103 05/12/2016   CO2 22 05/12/2016   BUN 8 05/12/2016   CREATININE 0.53 (L) 05/12/2016   Diabetic Labs (most recent): Lab Results  Component Value Date   HGBA1C 6.9 (H) 05/12/2016   HGBA1C 7.3 (H) 01/21/2016   HGBA1C 6.7 (H) 10/22/2015    Lipid Panel     Component Value Date/Time   CHOL 150 10/22/2015 0725   TRIG 99 10/22/2015 0725   HDL 54 10/22/2015 0725   LDLCALC 76 10/22/2015 0725     Assessment & Plan:   1. Uncontrolled type 2 diabetes mellitus with complication, without long-term current use  of insulin ([place])  She  remains at a high risk for more acute and chronic complications of diabetes which include CAD, CVA, CKD, retinopathy, and neuropathy. These are all discussed in detail with the patient.  Patient came with  improved A1c 6.9% from of  7.3%.  Recent labs reviewed, showing normal renal function.   - I have re-counseled the patient on diet management and weight loss  by adopting a carbohydrate restricted / protein rich  Diet.  - Suggestion is made for patient to avoid simple carbohydrates   from their diet including Cakes , Desserts, Ice Cream,  Soda (  diet and regular) , Sweet Tea , Candies,  Chips, Cookies, Artificial Sweeteners,   and "Sugar-free" Products .  This will help patient to have stable blood glucose profile and potentially avoid unintended  Weight gain.  - Patient is advised to stick to a routine mealtimes to eat 3 meals  a day and avoid unnecessary snacks ( to snack only to correct hypoglycemia).   - I have approached patient with the following individualized plan to manage diabetes and patient agrees. -Based on her A1c she would require an additional therapy however patient insists that she would do better going forward and wants to avoid it.   - I have counseled the patient on diet and weight management.  -I will continue Janumet 50/1000mg po BID. - I will continue Jardiance 10 milligrams by mouth every morning. Side effects and precautions discussed with her.  -I have emphasized the importance of dietary management to help patient lose more weight.  - Patient specific target  for A1c; LDL, HDL, Triglycerides, and  Waist Circumference were discussed in detail.  2) BP/HTN: controlled. Continue current medications including ACEI/ARB. 3) Lipids/HPL:   Controlled, due to unexplained elevation in transaminases I advised the patient to hold  Statins. She will call her GI doctor for follow-up and address elevated transaminases,  her repeat labs show improved  levels of transaminases.  4)  Weight/Diet: exercise, and carbohydrates information provided.  5) Chronic Care/Health Maintenance:  -Patient is on ACEI/ARB medications and encouraged to continue to follow up with Ophthalmology, Podiatrist at least yearly or according to recommendations, and advised to  stay away from smoking. I have recommended yearly flu vaccine and pneumonia vaccination at least every 5 years;  moderate intensity exercise for up to 150 minutes weekly; and  sleep for at least 7 hours a day.  - 25 minutes of time was spent on the care of this patient , 50% of which was applied for counseling on diabetes complications and their preventions.  - I advised patient to maintain close follow up with their PCP for primary care needs.  Patient is asked to bring meter and  blood glucose logs during their next visit.   Follow up plan: -Return in about 3 months (around 08/21/2016) for follow up with pre-visit labs.  Glade Lloyd, MD Phone: [phone]  Fax: [phone]   05/22/2016, 9:50 AM

## 2016-05-22 NOTE — Patient Instructions (Signed)

## 2016-08-14 DIAGNOSIS — Z7984 Long term (current) use of oral hypoglycemic drugs: Secondary | ICD-10-CM | POA: Diagnosis not present

## 2016-08-14 DIAGNOSIS — E119 Type 2 diabetes mellitus without complications: Secondary | ICD-10-CM | POA: Diagnosis not present

## 2016-08-14 DIAGNOSIS — I10 Essential (primary) hypertension: Secondary | ICD-10-CM | POA: Diagnosis not present

## 2016-08-14 DIAGNOSIS — Z7982 Long term (current) use of aspirin: Secondary | ICD-10-CM | POA: Diagnosis not present

## 2016-08-14 DIAGNOSIS — G245 Blepharospasm: Secondary | ICD-10-CM | POA: Diagnosis not present

## 2016-08-14 DIAGNOSIS — Z79899 Other long term (current) drug therapy: Secondary | ICD-10-CM | POA: Diagnosis not present

## 2016-08-24 ENCOUNTER — Ambulatory Visit: Payer: Medicare Other | Admitting: "Endocrinology

## 2016-09-18 ENCOUNTER — Ambulatory Visit: Payer: Medicare Other | Admitting: "Endocrinology

## 2016-09-29 DIAGNOSIS — E118 Type 2 diabetes mellitus with unspecified complications: Secondary | ICD-10-CM | POA: Diagnosis not present

## 2016-09-29 DIAGNOSIS — E1165 Type 2 diabetes mellitus with hyperglycemia: Secondary | ICD-10-CM | POA: Diagnosis not present

## 2016-09-30 LAB — MICROALBUMIN / CREATININE URINE RATIO
Creatinine, Urine: 89.7 mg/dL
MICROALB/CREAT RATIO: 5 mg/g{creat} (ref 0.0–30.0)
Microalbumin, Urine: 4.5 ug/mL

## 2016-09-30 LAB — CMP14+EGFR
ALBUMIN: 4.5 g/dL (ref 3.6–4.8)
ALT: 68 IU/L — ABNORMAL HIGH (ref 0–32)
AST: 43 IU/L — ABNORMAL HIGH (ref 0–40)
Albumin/Globulin Ratio: 1.8 (ref 1.2–2.2)
Alkaline Phosphatase: 78 IU/L (ref 39–117)
BUN / CREAT RATIO: 14 (ref 12–28)
BUN: 9 mg/dL (ref 8–27)
Bilirubin Total: 0.8 mg/dL (ref 0.0–1.2)
CO2: 25 mmol/L (ref 18–29)
CREATININE: 0.66 mg/dL (ref 0.57–1.00)
Calcium: 9.6 mg/dL (ref 8.7–10.3)
Chloride: 98 mmol/L (ref 96–106)
GFR calc non Af Amer: 95 mL/min/{1.73_m2} (ref >59)
GFR, EST AFRICAN AMERICAN: 109 mL/min/{1.73_m2} (ref >59)
GLUCOSE: 206 mg/dL — AB (ref 65–99)
Globulin, Total: 2.5 g/dL (ref 1.5–4.5)
Potassium: 4.8 mmol/L (ref 3.5–5.2)
Sodium: 141 mmol/L (ref 134–144)
TOTAL PROTEIN: 7 g/dL (ref 6.0–8.5)

## 2016-09-30 LAB — HEMOGLOBIN A1C
Est. average glucose Bld gHb Est-mCnc: 192 mg/dL
HEMOGLOBIN A1C: 8.3 % — AB (ref 4.8–5.6)

## 2016-10-05 ENCOUNTER — Ambulatory Visit (INDEPENDENT_AMBULATORY_CARE_PROVIDER_SITE_OTHER): Payer: Medicare Other | Admitting: "Endocrinology

## 2016-10-05 ENCOUNTER — Other Ambulatory Visit: Payer: Self-pay

## 2016-10-05 ENCOUNTER — Encounter: Payer: Self-pay | Admitting: "Endocrinology

## 2016-10-05 VITALS — BP 134/80 | HR 80 | Ht 64.0 in | Wt 238.0 lb

## 2016-10-05 DIAGNOSIS — E118 Type 2 diabetes mellitus with unspecified complications: Secondary | ICD-10-CM | POA: Diagnosis not present

## 2016-10-05 DIAGNOSIS — E1165 Type 2 diabetes mellitus with hyperglycemia: Secondary | ICD-10-CM | POA: Diagnosis not present

## 2016-10-05 DIAGNOSIS — E6609 Other obesity due to excess calories: Secondary | ICD-10-CM

## 2016-10-05 DIAGNOSIS — E782 Mixed hyperlipidemia: Secondary | ICD-10-CM

## 2016-10-05 DIAGNOSIS — IMO0002 Reserved for concepts with insufficient information to code with codable children: Secondary | ICD-10-CM

## 2016-10-05 DIAGNOSIS — IMO0001 Reserved for inherently not codable concepts without codable children: Secondary | ICD-10-CM

## 2016-10-05 DIAGNOSIS — I10 Essential (primary) hypertension: Secondary | ICD-10-CM | POA: Diagnosis not present

## 2016-10-05 DIAGNOSIS — Z6841 Body Mass Index (BMI) 40.0 and over, adult: Secondary | ICD-10-CM

## 2016-10-05 MED ORDER — SITAGLIPTIN PHOS-METFORMIN HCL 50-1000 MG PO TABS: 1.0000 | ORAL_TABLET | Freq: Two times a day (BID) | ORAL | 0 refills | Status: DC

## 2016-10-05 MED ORDER — EMPAGLIFLOZIN 25 MG PO TABS: 25.0000 mg | ORAL_TABLET | Freq: Every day | ORAL | 0 refills | Status: DC

## 2016-10-05 NOTE — Progress Notes (Signed)
Subjective:    Patient ID: Aimee Reed, female    DOB: 07/28/1956,    Past Medical History:  Diagnosis Date  . Anxiety   . Blepharospasm   . Depression   . Diabetes mellitus, type II ([place])   . GERD (gastroesophageal reflux disease)   . HTN (hypertension)   . Hyperlipidemia 03/11/2012  . Irritable bowel syndrome   . Overactive bladder   . Recurrent UTI    chronic macrodantin   Past Surgical History:  Procedure Laterality Date  . CARPAL TUNNEL RELEASE     both hands  . CHOLECYSTECTOMY    . COLONOSCOPY     Dr. Arnoldo Morale, around 2010  . COLONOSCOPY N/A 02/25/2013   LHT:DSKAJGO polyps, next tcs 02/2018 give tubular adenomas  . ESOPHAGOGASTRODUODENOSCOPY  07/31/2011   TLX:BWIOMB cervical esophageal web s/p dilation,query short segment Barrett's esophagus s/p bx (Barrett's esophagus without dysplasia),small hiatal hernia. 12 French Maloney dilator.  . ESOPHAGOGASTRODUODENOSCOPY (EGD) WITH ESOPHAGEAL DILATION N/A 02/25/2013   TDH:RCBULAGT distal esophagus/HH. next EGD 02/2016 given prior h/o Barrett's  . EYE SURGERY  November 2016   Eyelids, nerve issues   Social History   Social History  . Marital status: Married    Spouse name: N/A  . Number of children: 2  . Years of education: N/A   Occupational History  . unemployed Unemployed   Social History Main Topics  . Smoking status: Never Smoker  . Smokeless tobacco: Never Used  . Alcohol use No  . Drug use: No  . Sexual activity: Not Asked   Other Topics Concern  . None   Social History Narrative  . None   Outpatient Encounter Prescriptions as of 10/05/2016  Medication Sig  . aspirin EC 81 MG tablet Take 81 mg by mouth daily.    . cetirizine (ZYRTEC) 10 MG tablet Take 10 mg by mouth daily.  . DULoxetine (CYMBALTA) 60 MG capsule 60 mg delayed release capsule; oral every day  . empagliflozin (JARDIANCE) 25 MG TABS tablet Take 25 mg by mouth daily.  Marland Kitchen losartan (COZAAR) 100 MG tablet Take 100 mg by mouth daily.  .  Melatonin 3 MG TABS Take by mouth at bedtime.  Marland Kitchen omeprazole (PRILOSEC) 40 MG capsule Take 40 mg by mouth 2 (two) times daily.  . propranolol (INDERAL) 10 MG tablet Take 1-2 tablets (10-20 mg total) by mouth 2 (two) times daily. Takes 2 tablets (10m) in the mornings and 1 tablet (141m at bedtime  . sitaGLIPtin-metformin (JANUMET) 50-1000 MG tablet Take 1 tablet by mouth 2 (two) times daily with a meal.  . [DISCONTINUED] empagliflozin (JARDIANCE) 10 MG TABS tablet Take 10 mg by mouth daily.  . [DISCONTINUED] sitaGLIPtin-metformin (JANUMET) 50-1000 MG tablet Take 1 tablet by mouth 2 (two) times daily with a meal.   No facility-administered encounter medications on file as of 10/05/2016.    ALLERGIES: No Known Allergies VACCINATION STATUS:  There is no immunization history on file for this patient.  Diabetes  She presents for her follow-up diabetic visit. She has type 2 diabetes mellitus. Onset time: She was diagnosed at approximate age of [age]. Her disease course has been worsening. There are no hypoglycemic associated symptoms. Pertinent negatives for hypoglycemia include no confusion, headaches, pallor or seizures. Pertinent negatives for diabetes include no chest pain, no polydipsia, no polyphagia and no polyuria. There are no hypoglycemic complications. Symptoms are worsening. Risk factors for coronary artery disease include diabetes mellitus, dyslipidemia, hypertension, obesity and sedentary lifestyle. She is  compliant with treatment most of the time. Her weight is decreasing steadily. She is following a generally unhealthy diet. She participates in exercise intermittently. An ACE inhibitor/angiotensin II receptor blocker is being taken. Eye exam is current.  Hyperlipidemia  This is a chronic problem. The current episode started more than 1 year ago. The problem is controlled. Pertinent negatives include no chest pain, myalgias or shortness of breath. Current antihyperlipidemic treatment  includes statins. Risk factors for coronary artery disease include diabetes mellitus, dyslipidemia, hypertension, obesity and a sedentary lifestyle.  Hypertension  This is a chronic problem. The current episode started more than 1 year ago. Pertinent negatives include no chest pain, headaches, palpitations or shortness of breath. Risk factors for coronary artery disease include dyslipidemia, diabetes mellitus and obesity. Past treatments include angiotensin blockers.     Review of Systems  Constitutional: Negative for unexpected weight change.  HENT: Negative for trouble swallowing and voice change.   Eyes: Negative for visual disturbance.  Respiratory: Negative for cough, shortness of breath and wheezing.   Cardiovascular: Negative for chest pain, palpitations and leg swelling.  Gastrointestinal: Negative for diarrhea, nausea and vomiting.  Endocrine: Negative for cold intolerance, heat intolerance, polydipsia, polyphagia and polyuria.  Musculoskeletal: Negative for arthralgias and myalgias.  Skin: Negative for color change, pallor, rash and wound.  Neurological: Negative for seizures and headaches.  Psychiatric/Behavioral: Negative for confusion and suicidal ideas.    Objective:    BP 134/80   Pulse 80   Ht _0  (1.626 m)   Wt 238 lb (108 kg)   BMI 40.85 kg/m   Wt Readings from Last 3 Encounters:  10/05/16 238 lb (108 kg)  05/22/16 242 lb 9.6 oz (110 kg)  01/28/16 249 lb (112.9 kg)    Physical Exam  Constitutional: She is oriented to person, place, and time. She appears well-developed.  HENT:  Head: Normocephalic and atraumatic.  Eyes: EOM are normal.  She underwent bilateral upper eyelid surgery.  Neck: Normal range of motion. Neck supple. No tracheal deviation present. No thyromegaly present.  Cardiovascular: Normal rate and regular rhythm.   Pulmonary/Chest: Effort normal and breath sounds normal.  Abdominal: Soft. Bowel sounds are normal. There is no tenderness. There  is no guarding.  Musculoskeletal: Normal range of motion. She exhibits no edema.  Neurological: She is alert and oriented to person, place, and time. She has normal reflexes. No cranial nerve deficit. Coordination normal.  Skin: Skin is warm and dry. No rash noted. No erythema. No pallor.  Psychiatric: She has a normal mood and affect. Judgment normal.    Results for orders placed or performed in visit on 05/22/16  CMP14+EGFR  Result Value Ref Range   Glucose 206 (H) 65 - 99 mg/dL   BUN 9 8 - 27 mg/dL   Creatinine, Ser 0.66 0.57 - 1.00 mg/dL   GFR calc non Af Amer 95 >59 mL/min/1.73   GFR calc Af Amer 109 >59 mL/min/1.73   BUN/Creatinine Ratio 14 12 - 28   Sodium 141 134 - 144 mmol/L   Potassium 4.8 3.5 - 5.2 mmol/L   Chloride 98 96 - 106 mmol/L   CO2 25 18 - 29 mmol/L   Calcium 9.6 8.7 - 10.3 mg/dL   Total Protein 7.0 6.0 - 8.5 g/dL   Albumin 4.5 3.6 - 4.8 g/dL   Globulin, Total 2.5 1.5 - 4.5 g/dL   Albumin/Globulin Ratio 1.8 1.2 - 2.2   Bilirubin Total 0.8 0.0 - 1.2 mg/dL   Alkaline  Phosphatase 78 39 - 117 IU/L   AST 43 (H) 0 - 40 IU/L   ALT 68 (H) 0 - 32 IU/L  Hemoglobin A1c  Result Value Ref Range   Hgb A1c MFr Bld 8.3 (H) 4.8 - 5.6 %   Est. average glucose Bld gHb Est-mCnc 192 mg/dL  Microalbumin / creatinine urine ratio  Result Value Ref Range   Creatinine, Urine 89.7 Not Estab. mg/dL   Albumin, Urine 4.5 Not Estab. ug/mL   Microalb/Creat Ratio 5.0 0.0 - 30.0 mg/g creat   Complete Blood Count (Most recent): Lab Results  Component Value Date   WBC 8.1 02/18/2011   HGB 13.4 02/18/2011   HCT 40.9 02/18/2011   MCV 91.5 02/18/2011   PLT 233 02/18/2011   Chemistry (most recent): Lab Results  Component Value Date   NA 141 09/29/2016   K 4.8 09/29/2016   CL 98 09/29/2016   CO2 25 09/29/2016   BUN 9 09/29/2016   CREATININE 0.66 09/29/2016   Diabetic Labs (most recent): Lab Results  Component Value Date   HGBA1C 8.3 (H) 09/29/2016   HGBA1C 6.9 (H) 05/12/2016    HGBA1C 7.3 (H) 01/21/2016    Lipid Panel     Component Value Date/Time   CHOL 150 10/22/2015 0725   TRIG 99 10/22/2015 0725   HDL 54 10/22/2015 0725   LDLCALC 76 10/22/2015 0725     Assessment & Plan:   1. Uncontrolled type 2 diabetes mellitus with complication, without long-term current use of insulin ([place])  She  remains at a high risk for more acute and chronic complications of diabetes which include CAD, CVA, CKD, retinopathy, and neuropathy. These are all discussed in detail with the patient.  Patient came with Increasing A1c of 8.3% from 6.9%, This is mainly due to the fact that she went into insurance doughnut hole and interrupted her therapy with Janumet and Jardiance for several weeks in the interim.   Recent labs reviewed, showing normal renal function.   - I have re-counseled the patient on diet management and weight loss  by adopting a carbohydrate restricted / protein rich  Diet.  - Suggestion is made for patient to avoid simple carbohydrates   from their diet including Cakes , Desserts, Ice Cream,  Soda (  diet and regular) , Sweet Tea , Candies,  Chips, Cookies, Artificial Sweeteners,   and "Sugar-free" Products .  This will help patient to have stable blood glucose profile and potentially avoid unintended  Weight gain.  - Patient is advised to stick to a routine mealtimes to eat 3 meals  a day and avoid unnecessary snacks ( to snack only to correct hypoglycemia).   - I have approached patient with the following individualized plan to manage diabetes and patient agrees. -Based on her A1c she would require an additional therapy however patient insists that she would do better going forward and wants to avoid it.   - I have counseled the patient on diet and weight management.  -I will continue Janumet 50/1000mg po BID. - I will increase Jardiance to 25 mg by mouth every morning. - She will be assessed next visit if she requires any additional therapy. -  Side  effects and precautions discussed with her.  -I have emphasized the importance of dietary management to help patient lose more weight.  - Patient specific target  for A1c; LDL, HDL, Triglycerides, and  Waist Circumference were discussed in detail.  2) BP/HTN: controlled. Continue current medications including ACEI/ARB.  3) Lipids/HPL:   Controlled, due to unexplained elevation in transaminases I advised the patient to hold  Statins. She will call her GI doctor for follow-up and address elevated transaminases,  her repeat labs show improved levels of transaminases.  4)  Weight/Diet: exercise, and carbohydrates information provided.  5) Chronic Care/Health Maintenance:  -Patient is on ACEI/ARB medications and encouraged to continue to follow up with Ophthalmology, Podiatrist at least yearly or according to recommendations, and advised to  stay away from smoking. I have recommended yearly flu vaccine and pneumonia vaccination at least every 5 years; moderate intensity exercise for up to 150 minutes weekly; and  sleep for at least 7 hours a day.  - 25 minutes of time was spent on the care of this patient , 50% of which was applied for counseling on diabetes complications and their preventions.  - I advised patient to maintain close follow up with their PCP for primary care needs.  Patient is asked to bring meter and  blood glucose logs during their next visit.   Follow up plan: -Return for follow up with pre-visit labs.  Glade Lloyd, MD Phone: [phone]  Fax: [phone]   10/05/2016, 11:32 AM

## 2016-10-05 NOTE — Patient Instructions (Signed)

## 2016-10-16 ENCOUNTER — Other Ambulatory Visit (HOSPITAL_COMMUNITY): Payer: Self-pay | Admitting: Family Medicine

## 2016-10-16 DIAGNOSIS — Z1231 Encounter for screening mammogram for malignant neoplasm of breast: Secondary | ICD-10-CM

## 2016-10-19 ENCOUNTER — Ambulatory Visit (HOSPITAL_COMMUNITY)
Admission: RE | Admit: 2016-10-19 | Discharge: 2016-10-19 | Disposition: A | Discharge status: Home / Self Care | Payer: Medicare Other | Source: Ambulatory Visit | Attending: Family Medicine | Admitting: Family Medicine

## 2016-10-19 DIAGNOSIS — Z1231 Encounter for screening mammogram for malignant neoplasm of breast: Secondary | ICD-10-CM

## 2016-10-19 DIAGNOSIS — Z23 Encounter for immunization: Secondary | ICD-10-CM | POA: Diagnosis not present

## 2016-11-13 DIAGNOSIS — I10 Essential (primary) hypertension: Secondary | ICD-10-CM | POA: Diagnosis not present

## 2016-11-13 DIAGNOSIS — Z79899 Other long term (current) drug therapy: Secondary | ICD-10-CM | POA: Diagnosis not present

## 2016-11-13 DIAGNOSIS — E1136 Type 2 diabetes mellitus with diabetic cataract: Secondary | ICD-10-CM | POA: Diagnosis not present

## 2016-11-13 DIAGNOSIS — G245 Blepharospasm: Secondary | ICD-10-CM | POA: Diagnosis not present

## 2016-11-13 DIAGNOSIS — Z7982 Long term (current) use of aspirin: Secondary | ICD-10-CM | POA: Diagnosis not present

## 2016-11-13 DIAGNOSIS — Z7984 Long term (current) use of oral hypoglycemic drugs: Secondary | ICD-10-CM | POA: Diagnosis not present

## 2016-11-23 ENCOUNTER — Other Ambulatory Visit: Payer: Self-pay | Admitting: "Endocrinology

## 2016-12-14 DIAGNOSIS — K219 Gastro-esophageal reflux disease without esophagitis: Secondary | ICD-10-CM | POA: Diagnosis not present

## 2016-12-14 DIAGNOSIS — R109 Unspecified abdominal pain: Secondary | ICD-10-CM | POA: Diagnosis not present

## 2016-12-14 DIAGNOSIS — R1011 Right upper quadrant pain: Secondary | ICD-10-CM | POA: Diagnosis not present

## 2016-12-14 DIAGNOSIS — E119 Type 2 diabetes mellitus without complications: Secondary | ICD-10-CM | POA: Diagnosis not present

## 2016-12-14 DIAGNOSIS — Z1389 Encounter for screening for other disorder: Secondary | ICD-10-CM | POA: Diagnosis not present

## 2016-12-19 ENCOUNTER — Other Ambulatory Visit (HOSPITAL_COMMUNITY): Payer: Self-pay | Admitting: Internal Medicine

## 2016-12-19 DIAGNOSIS — R1011 Right upper quadrant pain: Secondary | ICD-10-CM

## 2016-12-28 DIAGNOSIS — E118 Type 2 diabetes mellitus with unspecified complications: Secondary | ICD-10-CM | POA: Diagnosis not present

## 2016-12-28 DIAGNOSIS — E119 Type 2 diabetes mellitus without complications: Secondary | ICD-10-CM | POA: Diagnosis not present

## 2016-12-28 DIAGNOSIS — E1165 Type 2 diabetes mellitus with hyperglycemia: Secondary | ICD-10-CM | POA: Diagnosis not present

## 2016-12-29 LAB — CMP14+EGFR
ALK PHOS: 57 IU/L (ref 39–117)
ALT: 55 IU/L — ABNORMAL HIGH (ref 0–32)
AST: 32 IU/L (ref 0–40)
Albumin/Globulin Ratio: 1.7 (ref 1.2–2.2)
Albumin: 4.2 g/dL (ref 3.6–4.8)
BILIRUBIN TOTAL: 0.4 mg/dL (ref 0.0–1.2)
BUN / CREAT RATIO: 16 (ref 12–28)
BUN: 10 mg/dL (ref 8–27)
CHLORIDE: 106 mmol/L (ref 96–106)
CO2: 22 mmol/L (ref 18–29)
Calcium: 9.8 mg/dL (ref 8.7–10.3)
Creatinine, Ser: 0.61 mg/dL (ref 0.57–1.00)
GFR calc Af Amer: 112 mL/min/{1.73_m2} (ref >59)
GFR calc non Af Amer: 97 mL/min/{1.73_m2} (ref >59)
GLOBULIN, TOTAL: 2.5 g/dL (ref 1.5–4.5)
GLUCOSE: 133 mg/dL — AB (ref 65–99)
Potassium: 4.7 mmol/L (ref 3.5–5.2)
SODIUM: 148 mmol/L — AB (ref 134–144)
Total Protein: 6.7 g/dL (ref 6.0–8.5)

## 2016-12-29 LAB — HEMOGLOBIN A1C
Est. average glucose Bld gHb Est-mCnc: 143 mg/dL
HEMOGLOBIN A1C: 6.6 % — AB (ref 4.8–5.6)

## 2017-01-04 ENCOUNTER — Ambulatory Visit (INDEPENDENT_AMBULATORY_CARE_PROVIDER_SITE_OTHER): Payer: Medicare Other | Admitting: "Endocrinology

## 2017-01-04 ENCOUNTER — Encounter: Payer: Self-pay | Admitting: "Endocrinology

## 2017-01-04 VITALS — BP 135/76 | HR 82 | Ht 64.0 in | Wt 239.0 lb

## 2017-01-04 DIAGNOSIS — E1165 Type 2 diabetes mellitus with hyperglycemia: Secondary | ICD-10-CM

## 2017-01-04 DIAGNOSIS — E782 Mixed hyperlipidemia: Secondary | ICD-10-CM

## 2017-01-04 DIAGNOSIS — E118 Type 2 diabetes mellitus with unspecified complications: Secondary | ICD-10-CM | POA: Diagnosis not present

## 2017-01-04 DIAGNOSIS — IMO0002 Reserved for concepts with insufficient information to code with codable children: Secondary | ICD-10-CM

## 2017-01-04 DIAGNOSIS — I10 Essential (primary) hypertension: Secondary | ICD-10-CM

## 2017-01-04 DIAGNOSIS — Z6841 Body Mass Index (BMI) 40.0 and over, adult: Secondary | ICD-10-CM

## 2017-01-04 NOTE — Patient Instructions (Signed)

## 2017-01-04 NOTE — Progress Notes (Signed)
Subjective:    Patient ID: Aimee Reed, female    DOB: [birth year],    Past Medical History:  Diagnosis Date  . Anxiety   . Blepharospasm   . Depression   . Diabetes mellitus, type II ([place])   . GERD (gastroesophageal reflux disease)   . HTN (hypertension)   . Hyperlipidemia 03/11/2012  . Irritable bowel syndrome   . Overactive bladder   . Recurrent UTI    chronic macrodantin   Past Surgical History:  Procedure Laterality Date  . CARPAL TUNNEL RELEASE     both hands  . CHOLECYSTECTOMY    . COLONOSCOPY     Dr. Arnoldo Morale, around 2010  . COLONOSCOPY N/A 02/25/2013   KSK:SHNGITJ polyps, next tcs 02/2018 give tubular adenomas  . ESOPHAGOGASTRODUODENOSCOPY  07/31/2011   LLV:DIXVEZ cervical esophageal web s/p dilation,query short segment Barrett's esophagus s/p bx (Barrett's esophagus without dysplasia),small hiatal hernia. 66 French Maloney dilator.  . ESOPHAGOGASTRODUODENOSCOPY (EGD) WITH ESOPHAGEAL DILATION N/A 02/25/2013   BMZ:TAEWYBRK distal esophagus/HH. next EGD 02/2016 given prior h/o Barrett's  . EYE SURGERY  November 2016   Eyelids, nerve issues   Social History   Social History  . Marital status: Married    Spouse name: N/A  . Number of children: 2  . Years of education: N/A   Occupational History  . unemployed Unemployed   Social History Main Topics  . Smoking status: Never Smoker  . Smokeless tobacco: Never Used  . Alcohol use No  . Drug use: No  . Sexual activity: Not Asked   Other Topics Concern  . None   Social History Narrative  . None   Outpatient Encounter Prescriptions as of 01/04/2017  Medication Sig  . aspirin EC 81 MG tablet Take 81 mg by mouth daily.    . cetirizine (ZYRTEC) 10 MG tablet Take 10 mg by mouth daily.  . DULoxetine (CYMBALTA) 60 MG capsule 60 mg delayed release capsule; oral every day  . JANUMET 50-1000 MG tablet TAKE 1 TABLET BY MOUTH TWO  TIMES DAILY WITH A MEAL  . JARDIANCE 25 MG TABS tablet TAKE 1 TABLET BY MOUTH  DAILY   . losartan (COZAAR) 100 MG tablet Take 100 mg by mouth daily.  . Melatonin 3 MG TABS Take by mouth at bedtime.  Marland Kitchen omeprazole (PRILOSEC) 40 MG capsule Take 40 mg by mouth 2 (two) times daily.  . propranolol (INDERAL) 10 MG tablet Take 1-2 tablets (10-20 mg total) by mouth 2 (two) times daily. Takes 2 tablets (51m) in the mornings and 1 tablet (136m at bedtime  . sitaGLIPtin-metformin (JANUMET) 50-1000 MG tablet Take 1 tablet by mouth 2 (two) times daily with a meal.   No facility-administered encounter medications on file as of 01/04/2017.    ALLERGIES: No Known Allergies VACCINATION STATUS:  There is no immunization history on file for this patient.  Diabetes  She presents for her follow-up diabetic visit. She has type 2 diabetes mellitus. Onset time: She was diagnosed at approximate age of [age]ears. Her disease course has been improving. There are no hypoglycemic associated symptoms. Pertinent negatives for hypoglycemia include no confusion, headaches, pallor or seizures. Pertinent negatives for diabetes include no chest pain, no polydipsia, no polyphagia and no polyuria. There are no hypoglycemic complications. Symptoms are improving. Risk factors for coronary artery disease include diabetes mellitus, dyslipidemia, hypertension, obesity and sedentary lifestyle. She is compliant with treatment most of the time. Her weight is stable. She is following a generally unhealthy  diet. She participates in exercise intermittently. An ACE inhibitor/angiotensin II receptor blocker is being taken. Eye exam is current.  Hyperlipidemia  This is a chronic problem. The current episode started more than 1 year ago. The problem is controlled. Pertinent negatives include no chest pain, myalgias or shortness of breath. Current antihyperlipidemic treatment includes statins. Risk factors for coronary artery disease include diabetes mellitus, dyslipidemia, hypertension, obesity and a sedentary lifestyle.  Hypertension   This is a chronic problem. The current episode started more than 1 year ago. Pertinent negatives include no chest pain, headaches, palpitations or shortness of breath. Risk factors for coronary artery disease include dyslipidemia, diabetes mellitus and obesity. Past treatments include angiotensin blockers.     Review of Systems  Constitutional: Negative for unexpected weight change.  HENT: Negative for trouble swallowing and voice change.   Eyes: Negative for visual disturbance.  Respiratory: Negative for cough, shortness of breath and wheezing.   Cardiovascular: Negative for chest pain, palpitations and leg swelling.  Gastrointestinal: Negative for diarrhea, nausea and vomiting.  Endocrine: Negative for cold intolerance, heat intolerance, polydipsia, polyphagia and polyuria.  Musculoskeletal: Negative for arthralgias and myalgias.  Skin: Negative for color change, pallor, rash and wound.  Neurological: Negative for seizures and headaches.  Psychiatric/Behavioral: Negative for confusion and suicidal ideas.    Objective:    BP 135/76   Pulse 82   Ht '5\' 4"'  (1.626 m)   Wt 239 lb (108.4 kg)   BMI 41.02 kg/m   Wt Readings from Last 3 Encounters:  01/04/17 239 lb (108.4 kg)  10/05/16 238 lb (108 kg)  05/22/16 242 lb 9.6 oz (110 kg)    Physical Exam  Constitutional: She is oriented to person, place, and time. She appears well-developed.  HENT:  Head: Normocephalic and atraumatic.  Eyes: EOM are normal.  She underwent bilateral upper eyelid surgery.  Neck: Normal range of motion. Neck supple. No tracheal deviation present. No thyromegaly present.  Cardiovascular: Normal rate and regular rhythm.   Pulmonary/Chest: Effort normal and breath sounds normal.  Abdominal: Soft. Bowel sounds are normal. There is no tenderness. There is no guarding.  Musculoskeletal: Normal range of motion. She exhibits no edema.  Neurological: She is alert and oriented to person, place, and time. She has  normal reflexes. No cranial nerve deficit. Coordination normal.  Skin: Skin is warm and dry. No rash noted. No erythema. No pallor.  Psychiatric: She has a normal mood and affect. Judgment normal.    Results for orders placed or performed in visit on 10/05/16  Hemoglobin A1c  Result Value Ref Range   Hgb A1c MFr Bld 6.6 (H) 4.8 - 5.6 %   Est. average glucose Bld gHb Est-mCnc 143 mg/dL  CMP14+EGFR  Result Value Ref Range   Glucose 133 (H) 65 - 99 mg/dL   BUN 10 8 - 27 mg/dL   Creatinine, Ser 0.61 0.57 - 1.00 mg/dL   GFR calc non Af Amer 97 >59 mL/min/1.73   GFR calc Af Amer 112 >59 mL/min/1.73   BUN/Creatinine Ratio 16 12 - 28   Sodium 148 (H) 134 - 144 mmol/L   Potassium 4.7 3.5 - 5.2 mmol/L   Chloride 106 96 - 106 mmol/L   CO2 22 18 - 29 mmol/L   Calcium 9.8 8.7 - 10.3 mg/dL   Total Protein 6.7 6.0 - 8.5 g/dL   Albumin 4.2 3.6 - 4.8 g/dL   Globulin, Total 2.5 1.5 - 4.5 g/dL   Albumin/Globulin Ratio 1.7 1.2 -  2.2   Bilirubin Total 0.4 0.0 - 1.2 mg/dL   Alkaline Phosphatase 57 39 - 117 IU/L   AST 32 0 - 40 IU/L   ALT 55 (H) 0 - 32 IU/L   Complete Blood Count (Most recent): Lab Results  Component Value Date   WBC 8.1 02/18/2011   HGB 13.4 02/18/2011   HCT 40.9 02/18/2011   MCV 91.5 02/18/2011   PLT 233 02/18/2011   Chemistry (most recent): Lab Results  Component Value Date   NA 148 (H) 12/28/2016   K 4.7 12/28/2016   CL 106 12/28/2016   CO2 22 12/28/2016   BUN 10 12/28/2016   CREATININE 0.61 12/28/2016   Diabetic Labs (most recent): Lab Results  Component Value Date   HGBA1C 6.6 (H) 12/28/2016   HGBA1C 8.3 (H) 09/29/2016   HGBA1C 6.9 (H) 05/12/2016    Lipid Panel     Component Value Date/Time   CHOL 150 10/22/2015 0725   TRIG 99 10/22/2015 0725   HDL 54 10/22/2015 0725   LDLCALC 76 10/22/2015 0725     Assessment & Plan:   1. Uncontrolled type 2 diabetes mellitus with complication, without long-term current use of insulin ([place])  She  remains at a  high risk for more acute and chronic complications of diabetes which include CAD, CVA, CKD, retinopathy, and neuropathy. These are all discussed in detail with the patient.  Patient came with improving a1c of 6.6% from 8.3% .  Recent labs reviewed, showing normal renal function.  - I have re-counseled the patient on diet management and weight loss  by adopting a carbohydrate restricted / protein rich  Diet.  - Suggestion is made for patient to avoid simple carbohydrates   from their diet including Cakes , Desserts, Ice Cream,  Soda (  diet and regular) , Sweet Tea , Candies,  Chips, Cookies, Artificial Sweeteners,   and "Sugar-free" Products .  This will help patient to have stable blood glucose profile and potentially avoid unintended  Weight gain.  - Patient is advised to stick to a routine mealtimes to eat 3 meals  a day and avoid unnecessary snacks ( to snack only to correct hypoglycemia).   - I have approached patient with the following individualized plan to manage diabetes and patient agrees. -Based on her A1c she would require an additional therapy however patient insists that she would do better going forward and wants to avoid it.   - I have counseled the patient on diet and weight management.  -I will continue Janumet 50/1000mg po BID. - I will continue Jardiance to 25 mg by mouth every morning. - She will be assessed next visit if she requires any additional therapy. -  Side effects and precautions discussed with her.  -I have emphasized the importance of dietary management to help patient lose more weight.  - Patient specific target  for A1c; LDL, HDL, Triglycerides, and  Waist Circumference were discussed in detail.  2) BP/HTN: controlled. Continue current medications including ACEI/ARB. 3) Lipids/HPL:   Controlled, due to unexplained elevation in transaminases I advised the patient to hold  Statins. She will call her GI doctor for follow-up and address elevated  transaminases,  her repeat labs show improved levels of transaminases.  I will include fasting lipid panel with her next lab work.  4)  Weight/Diet: exercise, and carbohydrates information provided.  5) Chronic Care/Health Maintenance:  -Patient is on ACEI/ARB medications and encouraged to continue to follow up with Ophthalmology, Podiatrist at  least yearly or according to recommendations, and advised to  stay away from smoking. I have recommended yearly flu vaccine and pneumonia vaccination at least every 5 years; moderate intensity exercise for up to 150 minutes weekly; and  sleep for at least 7 hours a day.  - 25 minutes of time was spent on the care of this patient , 50% of which was applied for counseling on diabetes complications and their preventions.  - I advised patient to maintain close follow up with their PCP for primary care needs.  Patient is asked to bring meter and  blood glucose logs during their next visit.   Follow up plan: -Return in about 4 months (around 05/06/2017) for follow up with pre-visit labs.  Glade Lloyd, MD Phone: [phone]  Fax: [phone]   01/04/2017, 10:56 AM

## 2017-01-31 DIAGNOSIS — M659 Synovitis and tenosynovitis, unspecified: Secondary | ICD-10-CM | POA: Diagnosis not present

## 2017-01-31 DIAGNOSIS — M65042 Abscess of tendon sheath, left hand: Secondary | ICD-10-CM | POA: Diagnosis not present

## 2017-02-12 DIAGNOSIS — G245 Blepharospasm: Secondary | ICD-10-CM | POA: Diagnosis not present

## 2017-02-13 ENCOUNTER — Encounter: Payer: Self-pay | Admitting: *Deleted

## 2017-02-20 ENCOUNTER — Other Ambulatory Visit (HOSPITAL_COMMUNITY)
Admission: RE | Admit: 2017-02-20 | Discharge: 2017-02-20 | Disposition: A | Discharge status: Home / Self Care | Payer: Medicare Other | Source: Ambulatory Visit | Attending: Adult Health | Admitting: Adult Health

## 2017-02-20 ENCOUNTER — Encounter: Payer: Self-pay | Admitting: Adult Health

## 2017-02-20 ENCOUNTER — Ambulatory Visit (INDEPENDENT_AMBULATORY_CARE_PROVIDER_SITE_OTHER): Payer: Medicare Other | Admitting: Adult Health

## 2017-02-20 VITALS — BP 140/70 | HR 60 | Ht 63.25 in | Wt 235.0 lb

## 2017-02-20 DIAGNOSIS — Z1212 Encounter for screening for malignant neoplasm of rectum: Secondary | ICD-10-CM

## 2017-02-20 DIAGNOSIS — Z01419 Encounter for gynecological examination (general) (routine) without abnormal findings: Secondary | ICD-10-CM | POA: Diagnosis not present

## 2017-02-20 DIAGNOSIS — Z124 Encounter for screening for malignant neoplasm of cervix: Secondary | ICD-10-CM

## 2017-02-20 DIAGNOSIS — Z1211 Encounter for screening for malignant neoplasm of colon: Secondary | ICD-10-CM | POA: Diagnosis not present

## 2017-02-20 DIAGNOSIS — N3946 Mixed incontinence: Secondary | ICD-10-CM | POA: Diagnosis not present

## 2017-02-20 DIAGNOSIS — N816 Rectocele: Secondary | ICD-10-CM

## 2017-02-20 LAB — HEMOCCULT GUIAC POC 1CARD (OFFICE): Fecal Occult Blood, POC: NEGATIVE

## 2017-02-20 NOTE — Progress Notes (Signed)
Patient ID: Aimee Reed, female   DOB: 05-18-1956, 61 y.o.   MRN: [id number] History of Present Illness: Aimee Reed is a 61 year old white female, married, PM, new to this practice in for a well woman gyn exam and pap. PCP is Dr Hilma Favors.    Current Medications, Allergies, Past Medical History, Past Surgical History, Family History and Social History were reviewed in [employer] record.     Review of Systems: Patient denies any headaches, hearing loss, fatigue, blurred vision, shortness of breath, chest pain, abdominal pain, problems with bowel movements,  or intercourse(not sexually active) No joint pain or mood swings. Has urge incontinence and when coughs or sneezes   Physical Exam:BP 140/70 (BP Location: Right Arm, Patient Position: Sitting, Cuff Size: Large)   Pulse 60   Ht 5' 3.25" (1.607 m)   Wt 235 lb (106.6 kg)   BMI 41.30 kg/m  General:  Well developed, well nourished, no acute distress Skin:  Warm and dry Neck:  Midline trachea, normal thyroid, good ROM, no lymphadenopathy,no carotid bruits heard Lungs; Clear to auscultation bilaterally Breast:  No dominant palpable mass, retraction, or nipple discharge Cardiovascular: Regular rate and rhythm Abdomen:  Soft, non tender, no hepatosplenomegaly Pelvic:  External genitalia is normal in appearance, no lesions.  The vagina is pale with loss of moisture and rugae. Urethra has no lesions or masses. The cervix is smooth, pap with HPV performed.  Uterus is felt to be normal size, shape, and contour.  No adnexal masses or tenderness noted.Bladder is non tender, no masses felt. Rectal: Good sphincter tone, no polyps, or hemorrhoids felt.  Hemoccult negative.+rectocele Extremities/musculoskeletal:  No swelling or varicosities noted, no clubbing or cyanosis Psych:  No mood changes, alert and cooperative,seems happy PHQ 9 score 9, is on meds.Discussed rectocele and UI.  Impression:  1. Encounter for routine  gynecological examination with Papanicolaou smear of cervix   2. Routine cervical smear   3. Encounter for colorectal cancer screening   4. Rectocele   5. Mixed stress and urge urinary incontinence      Plan: Reviewed medical explainer #4 Physical in 1 year, pap in 3 if normal Mammogram yearly Colonoscopy per GI Labs with PCP Try kegels, void often

## 2017-02-22 LAB — CYTOLOGY - PAP
Diagnosis: NEGATIVE
HPV: NOT DETECTED

## 2017-04-30 DIAGNOSIS — E118 Type 2 diabetes mellitus with unspecified complications: Secondary | ICD-10-CM | POA: Diagnosis not present

## 2017-04-30 DIAGNOSIS — E1165 Type 2 diabetes mellitus with hyperglycemia: Secondary | ICD-10-CM | POA: Diagnosis not present

## 2017-05-01 LAB — CMP14+EGFR
A/G RATIO: 1.8 (ref 1.2–2.2)
ALT: 83 IU/L — ABNORMAL HIGH (ref 0–32)
AST: 59 IU/L — ABNORMAL HIGH (ref 0–40)
Albumin: 4.4 g/dL (ref 3.6–4.8)
Alkaline Phosphatase: 69 IU/L (ref 39–117)
BUN/Creatinine Ratio: 15 (ref 12–28)
BUN: 9 mg/dL (ref 8–27)
Bilirubin Total: 0.6 mg/dL (ref 0.0–1.2)
CALCIUM: 9.3 mg/dL (ref 8.7–10.3)
CO2: 25 mmol/L (ref 20–29)
CREATININE: 0.6 mg/dL (ref 0.57–1.00)
Chloride: 101 mmol/L (ref 96–106)
GFR calc Af Amer: 113 mL/min/{1.73_m2} (ref >59)
GFR, EST NON AFRICAN AMERICAN: 98 mL/min/{1.73_m2} (ref >59)
GLOBULIN, TOTAL: 2.5 g/dL (ref 1.5–4.5)
Glucose: 174 mg/dL — ABNORMAL HIGH (ref 65–99)
POTASSIUM: 4.3 mmol/L (ref 3.5–5.2)
SODIUM: 140 mmol/L (ref 134–144)
Total Protein: 6.9 g/dL (ref 6.0–8.5)

## 2017-05-01 LAB — LIPID PANEL
CHOL/HDL RATIO: 3.9 ratio (ref 0.0–4.4)
Cholesterol, Total: 217 mg/dL — ABNORMAL HIGH (ref 100–199)
HDL: 56 mg/dL (ref >39)
LDL CALC: 131 mg/dL — AB (ref 0–99)
TRIGLYCERIDES: 149 mg/dL (ref 0–149)
VLDL Cholesterol Cal: 30 mg/dL (ref 5–40)

## 2017-05-07 ENCOUNTER — Ambulatory Visit (INDEPENDENT_AMBULATORY_CARE_PROVIDER_SITE_OTHER): Payer: Medicare Other | Admitting: "Endocrinology

## 2017-05-07 ENCOUNTER — Encounter: Payer: Self-pay | Admitting: "Endocrinology

## 2017-05-07 VITALS — BP 143/82 | HR 71 | Ht 63.25 in | Wt 241.0 lb

## 2017-05-07 DIAGNOSIS — E782 Mixed hyperlipidemia: Secondary | ICD-10-CM | POA: Diagnosis not present

## 2017-05-07 DIAGNOSIS — I10 Essential (primary) hypertension: Secondary | ICD-10-CM

## 2017-05-07 DIAGNOSIS — E1165 Type 2 diabetes mellitus with hyperglycemia: Secondary | ICD-10-CM

## 2017-05-07 DIAGNOSIS — E118 Type 2 diabetes mellitus with unspecified complications: Secondary | ICD-10-CM | POA: Diagnosis not present

## 2017-05-07 DIAGNOSIS — IMO0002 Reserved for concepts with insufficient information to code with codable children: Secondary | ICD-10-CM

## 2017-05-07 DIAGNOSIS — Z6841 Body Mass Index (BMI) 40.0 and over, adult: Secondary | ICD-10-CM

## 2017-05-07 NOTE — Patient Instructions (Signed)

## 2017-05-07 NOTE — Progress Notes (Signed)
Subjective:    Patient ID: Aimee Reed, female    DOB: [birth year],    Past Medical History:  Diagnosis Date  . Anxiety   . Blepharospasm   . Depression   . Diabetes mellitus, type II ([place])   . GERD (gastroesophageal reflux disease)   . HTN (hypertension)   . Hyperlipidemia 03/11/2012  . Irritable bowel syndrome   . Overactive bladder   . Recurrent UTI    chronic macrodantin   Past Surgical History:  Procedure Laterality Date  . CARPAL TUNNEL RELEASE     both hands  . CHOLECYSTECTOMY    . COLONOSCOPY     Dr. Arnoldo Morale, around 2010  . COLONOSCOPY N/A 02/25/2013   JQB:HALPFXT polyps, next tcs 02/2018 give tubular adenomas  . ESOPHAGOGASTRODUODENOSCOPY  07/31/2011   KWI:OXBDZH cervical esophageal web s/p dilation,query short segment Barrett's esophagus s/p bx (Barrett's esophagus without dysplasia),small hiatal hernia. 51 French Maloney dilator.  . ESOPHAGOGASTRODUODENOSCOPY (EGD) WITH ESOPHAGEAL DILATION N/A 02/25/2013   GDJ:MEQASTMH distal esophagus/HH. next EGD 02/2016 given prior h/o Barrett's  . EYE SURGERY  November 2016   Eyelids, nerve issues   Social History   Social History  . Marital status: Married    Spouse name: N/A  . Number of children: 2  . Years of education: N/A   Occupational History  . unemployed Unemployed   Social History Main Topics  . Smoking status: Never Smoker  . Smokeless tobacco: Never Used  . Alcohol use No  . Drug use: No  . Sexual activity: Not Currently    Birth control/ protection: Post-menopausal   Other Topics Concern  . None   Social History Narrative  . None   Outpatient Encounter Prescriptions as of 05/07/2017  Medication Sig  . sitaGLIPtin-metformin (JANUMET) 50-1000 MG tablet Take 1 tablet by mouth 2 (two) times daily with a meal.  . aspirin EC 81 MG tablet Take 81 mg by mouth daily.    . celecoxib (CELEBREX) 200 MG capsule Take 200 mg by mouth daily as needed.  . DULoxetine (CYMBALTA) 60 MG capsule 60 mg delayed  release capsule; oral every day  . JARDIANCE 25 MG TABS tablet TAKE 1 TABLET BY MOUTH  DAILY  . loratadine (CLARITIN) 10 MG tablet Take 10 mg by mouth daily.  Marland Kitchen losartan (COZAAR) 100 MG tablet Take 100 mg by mouth daily.  . Melatonin 3 MG TABS Take 5 mg by mouth at bedtime.   . Multiple Vitamins-Minerals (HAIR SKIN NAILS PO) Take by mouth daily.  Marland Kitchen omeprazole (PRILOSEC) 40 MG capsule Take 40 mg by mouth 2 (two) times daily.  . OnabotulinumtoxinA (BOTOX IJ) Inject as directed. Every 3 months  . propranolol (INDERAL) 10 MG tablet Take 1-2 tablets (10-20 mg total) by mouth 2 (two) times daily. Takes 2 tablets (20mg ) in the mornings and 1 tablet (10mg ) at bedtime  . simvastatin (ZOCOR) 20 MG tablet Take 20 mg by mouth at bedtime.  . vitamin B-12 (CYANOCOBALAMIN) 1000 MCG tablet Take 1,000 mcg by mouth daily.   No facility-administered encounter medications on file as of 05/07/2017.    ALLERGIES: No Known Allergies VACCINATION STATUS:  There is no immunization history on file for this patient.  Diabetes  She presents for her follow-up diabetic visit. She has type 2 diabetes mellitus. Onset time: She was diagnosed at approximate age of 34 years. Her disease course has been stable. There are no hypoglycemic associated symptoms. Pertinent negatives for hypoglycemia include no confusion, headaches, pallor or  seizures. Pertinent negatives for diabetes include no chest pain, no polydipsia, no polyphagia and no polyuria. There are no hypoglycemic complications. Symptoms are stable. Risk factors for coronary artery disease include diabetes mellitus, dyslipidemia, hypertension, obesity and sedentary lifestyle. She is compliant with treatment most of the time. Her weight is increasing steadily. She is following a generally unhealthy diet. She participates in exercise intermittently. An ACE inhibitor/angiotensin II receptor blocker is being taken. Eye exam is current.  Hyperlipidemia  This is a chronic  problem. The current episode started more than 1 year ago. The problem is controlled. Pertinent negatives include no chest pain, myalgias or shortness of breath. Current antihyperlipidemic treatment includes statins. Risk factors for coronary artery disease include diabetes mellitus, dyslipidemia, hypertension, obesity and a sedentary lifestyle.  Hypertension  This is a chronic problem. The current episode started more than 1 year ago. Pertinent negatives include no chest pain, headaches, palpitations or shortness of breath. Risk factors for coronary artery disease include dyslipidemia, diabetes mellitus and obesity. Past treatments include angiotensin blockers.     Review of Systems  Constitutional: Negative for unexpected weight change.  HENT: Negative for trouble swallowing and voice change.   Eyes: Negative for visual disturbance.  Respiratory: Negative for cough, shortness of breath and wheezing.   Cardiovascular: Negative for chest pain, palpitations and leg swelling.  Gastrointestinal: Negative for diarrhea, nausea and vomiting.  Endocrine: Negative for cold intolerance, heat intolerance, polydipsia, polyphagia and polyuria.  Musculoskeletal: Negative for arthralgias and myalgias.  Skin: Negative for color change, pallor, rash and wound.  Neurological: Negative for seizures and headaches.  Psychiatric/Behavioral: Negative for confusion and suicidal ideas.    Objective:    BP (!) 143/82   Pulse 71   Ht 5' 3.25" (1.607 m)   Wt 241 lb (109.3 kg)   BMI 42.35 kg/m   Wt Readings from Last 3 Encounters:  05/07/17 241 lb (109.3 kg)  02/20/17 235 lb (106.6 kg)  01/04/17 239 lb (108.4 kg)    Physical Exam  Constitutional: She is oriented to person, place, and time. She appears well-developed.  HENT:  Head: Normocephalic and atraumatic.  Eyes: EOM are normal.  She underwent bilateral upper eyelid surgery.  Neck: Normal range of motion. Neck supple. No tracheal deviation present. No  thyromegaly present.  Cardiovascular: Normal rate and regular rhythm.   Pulmonary/Chest: Effort normal and breath sounds normal.  Abdominal: Soft. Bowel sounds are normal. There is no tenderness. There is no guarding.  Musculoskeletal: Normal range of motion. She exhibits no edema.  Neurological: She is alert and oriented to person, place, and time. She has normal reflexes. No cranial nerve deficit. Coordination normal.  Skin: Skin is warm and dry. No rash noted. No erythema. No pallor.  Psychiatric: She has a normal mood and affect. Judgment normal.    Results for orders placed or performed in visit on 02/20/17  POCT occult blood stool  Result Value Ref Range   Fecal Occult Blood, POC Negative Negative   Card #1 Date     Card #2 Fecal Occult Blod, POC     Card #2 Date     Card #3 Fecal Occult Blood, POC     Card #3 Date    Cytology - PAP  Result Value Ref Range   Adequacy      Satisfactory for evaluation  endocervical/transformation zone component PRESENT.   Diagnosis      NEGATIVE FOR INTRAEPITHELIAL LESIONS OR MALIGNANCY.   HPV NOT DETECTED  Material Submitted CervicoVaginal Pap [ThinPrep Imaged]    Complete Blood Count (Most recent): Lab Results  Component Value Date   WBC 8.1 02/18/2011   HGB 13.4 02/18/2011   HCT 40.9 02/18/2011   MCV 91.5 02/18/2011   PLT 233 02/18/2011   Chemistry (most recent): Lab Results  Component Value Date   NA 140 04/30/2017   K 4.3 04/30/2017   CL 101 04/30/2017   CO2 25 04/30/2017   BUN 9 04/30/2017   CREATININE 0.60 04/30/2017   Diabetic Labs (most recent): Lab Results  Component Value Date   HGBA1C 6.6 (H) 12/28/2016   HGBA1C 8.3 (H) 09/29/2016   HGBA1C 6.9 (H) 05/12/2016    Lipid Panel     Component Value Date/Time   CHOL 217 (H) 04/30/2017 0739   TRIG 149 04/30/2017 0739   HDL 56 04/30/2017 0739   CHOLHDL 3.9 04/30/2017 0739   LDLCALC 131 (H) 04/30/2017 0739     Assessment & Plan:   1. Uncontrolled type 2  diabetes mellitus with complication, without long-term current use of insulin ([place])  She does not report any gross competitions of type 2 diabetes, however she remains at a high risk for more acute and chronic complications of diabetes which include CAD, CVA, CKD, retinopathy, and neuropathy. These are all discussed in detail with the patient.  - Her recent labs did not include A1c, during her last visit A1c was 6.6% improving from 8.3%.   Recent labs reviewed, showing normal renal function.  - I have re-counseled the patient on diet management and weight loss  by adopting a carbohydrate restricted / protein rich  Diet.  - Suggestion is made for her to avoid simple carbohydrates  from her diet including Cakes, Sweet Desserts, Ice Cream, Soda (diet and regular), Sweet Tea, Candies, Chips, Cookies, Store Bought Juices, Alcohol in Excess of  1-2 drinks a day, Artificial Sweeteners, and "Sugar-free" Products. This will help patient to have stable blood glucose profile and potentially avoid unintended weight gain.   - Patient is advised to stick to a routine mealtimes to eat 3 meals  a day and avoid unnecessary snacks ( to snack only to correct hypoglycemia).   - I have approached patient with the following individualized plan to manage diabetes and patient agrees.  - I have counseled the patient on diet and weight management.  -I will continue Janumet 50/1000mg  po BID. - I will continue Jardiance to 25 mg by mouth every morning. - She will be assessed next visit if she requires any additional therapy. -  Side effects and precautions of Jardiance and Janumet are  discussed with her.  -I have emphasized the importance of dietary management to help patient lose more weight.  - Patient specific target  for A1c; LDL, HDL, Triglycerides, and  Waist Circumference were discussed in detail.  2) BP/HTN: uncontrolled. Continue current medications including ACEI/ARB. 3) Lipids/HPL:   Controlled, due to  unexplained elevation in transaminases, she is not on statins. She is following up with GI. - Her repeat fasting lipid panel shows LDL of 131, triglycerides 149, HDL 56.  4)  Weight/Diet: exercise, and carbohydrates information provided.  5) Chronic Care/Health Maintenance:  -Patient is on ACEI/ARB medications and encouraged to continue to follow up with Ophthalmology, Podiatrist at least yearly or according to recommendations, and advised to  stay away from smoking. I have recommended yearly flu vaccine and pneumonia vaccination at least every 5 years; moderate intensity exercise for up to 150 minutes weekly;  and  sleep for at least 7 hours a day.  - Time spent with the patient: 25 min, of which >50% was spent in reviewing her sugar logs , discussing her hypo- and hyper-glycemic episodes, reviewing her current and  previous labs and insulin doses and developing a plan to avoid hypo- and hyper-glycemia.    - I advised patient to maintain close follow up with their PCP for primary care needs.   Follow up plan: -Return in about 3 months (around 08/07/2017) for follow up with pre-visit labs.  Glade Lloyd, MD Phone: [phone]  Fax: [phone]  This note was partially dictated with voice recognition software. Similar sounding words can be transcribed inadequately or may not  be corrected upon review.  05/07/2017, 12:46 PM

## 2017-05-08 DIAGNOSIS — G245 Blepharospasm: Secondary | ICD-10-CM | POA: Diagnosis not present

## 2017-05-22 DIAGNOSIS — Z1389 Encounter for screening for other disorder: Secondary | ICD-10-CM | POA: Diagnosis not present

## 2017-05-22 DIAGNOSIS — G4733 Obstructive sleep apnea (adult) (pediatric): Secondary | ICD-10-CM | POA: Diagnosis not present

## 2017-05-22 DIAGNOSIS — I10 Essential (primary) hypertension: Secondary | ICD-10-CM | POA: Diagnosis not present

## 2017-05-22 DIAGNOSIS — Z0001 Encounter for general adult medical examination with abnormal findings: Secondary | ICD-10-CM | POA: Diagnosis not present

## 2017-05-22 DIAGNOSIS — G245 Blepharospasm: Secondary | ICD-10-CM | POA: Diagnosis not present

## 2017-05-22 DIAGNOSIS — E1165 Type 2 diabetes mellitus with hyperglycemia: Secondary | ICD-10-CM | POA: Diagnosis not present

## 2017-06-12 DIAGNOSIS — M7501 Adhesive capsulitis of right shoulder: Secondary | ICD-10-CM | POA: Diagnosis not present

## 2017-06-12 DIAGNOSIS — M7581 Other shoulder lesions, right shoulder: Secondary | ICD-10-CM | POA: Diagnosis not present

## 2017-06-12 DIAGNOSIS — M25512 Pain in left shoulder: Secondary | ICD-10-CM | POA: Diagnosis not present

## 2017-06-20 DIAGNOSIS — M7502 Adhesive capsulitis of left shoulder: Secondary | ICD-10-CM | POA: Diagnosis not present

## 2017-06-20 DIAGNOSIS — M19012 Primary osteoarthritis, left shoulder: Secondary | ICD-10-CM | POA: Diagnosis not present

## 2017-06-26 DIAGNOSIS — Z23 Encounter for immunization: Secondary | ICD-10-CM | POA: Diagnosis not present

## 2017-08-08 ENCOUNTER — Ambulatory Visit: Payer: Medicare Other | Admitting: "Endocrinology

## 2017-08-13 DIAGNOSIS — H02834 Dermatochalasis of left upper eyelid: Secondary | ICD-10-CM | POA: Diagnosis not present

## 2017-08-13 DIAGNOSIS — H02831 Dermatochalasis of right upper eyelid: Secondary | ICD-10-CM | POA: Diagnosis not present

## 2017-08-13 DIAGNOSIS — G245 Blepharospasm: Secondary | ICD-10-CM | POA: Diagnosis not present

## 2017-08-13 DIAGNOSIS — H02221 Mechanical lagophthalmos right upper eyelid: Secondary | ICD-10-CM | POA: Diagnosis not present

## 2017-08-13 DIAGNOSIS — H5340 Unspecified visual field defects: Secondary | ICD-10-CM | POA: Diagnosis not present

## 2017-08-13 DIAGNOSIS — Z9889 Other specified postprocedural states: Secondary | ICD-10-CM | POA: Diagnosis not present

## 2017-08-13 DIAGNOSIS — R482 Apraxia: Secondary | ICD-10-CM | POA: Diagnosis not present

## 2017-08-29 DIAGNOSIS — E1165 Type 2 diabetes mellitus with hyperglycemia: Secondary | ICD-10-CM | POA: Diagnosis not present

## 2017-08-29 DIAGNOSIS — G245 Blepharospasm: Secondary | ICD-10-CM | POA: Diagnosis not present

## 2017-08-29 DIAGNOSIS — I10 Essential (primary) hypertension: Secondary | ICD-10-CM | POA: Diagnosis not present

## 2017-08-29 DIAGNOSIS — Z1389 Encounter for screening for other disorder: Secondary | ICD-10-CM | POA: Diagnosis not present

## 2017-08-29 DIAGNOSIS — E785 Hyperlipidemia, unspecified: Secondary | ICD-10-CM | POA: Diagnosis not present

## 2017-08-29 DIAGNOSIS — E782 Mixed hyperlipidemia: Secondary | ICD-10-CM | POA: Diagnosis not present
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
## 2017-11-02 ENCOUNTER — Encounter (HOSPITAL_COMMUNITY): Payer: Self-pay | Admitting: Psychiatry

## 2017-11-02 ENCOUNTER — Ambulatory Visit (HOSPITAL_COMMUNITY): Payer: Medicare Other | Admitting: Psychiatry

## 2017-11-02 VITALS — BP 164/94 | HR 75 | Ht 63.5 in | Wt 240.0 lb

## 2017-11-02 DIAGNOSIS — F331 Major depressive disorder, recurrent, moderate: Secondary | ICD-10-CM | POA: Diagnosis not present

## 2017-11-02 DIAGNOSIS — F410 Panic disorder [episodic paroxysmal anxiety] without agoraphobia: Secondary | ICD-10-CM

## 2017-11-02 DIAGNOSIS — Z6379 Other stressful life events affecting family and household: Secondary | ICD-10-CM | POA: Diagnosis not present

## 2017-11-02 DIAGNOSIS — F419 Anxiety disorder, unspecified: Secondary | ICD-10-CM | POA: Diagnosis not present

## 2017-11-02 DIAGNOSIS — Z91410 Personal history of adult physical and sexual abuse: Secondary | ICD-10-CM | POA: Diagnosis not present

## 2017-11-02 DIAGNOSIS — E119 Type 2 diabetes mellitus without complications: Secondary | ICD-10-CM | POA: Diagnosis not present

## 2017-11-02 DIAGNOSIS — Z818 Family history of other mental and behavioral disorders: Secondary | ICD-10-CM

## 2017-11-02 DIAGNOSIS — Z810 Family history of intellectual disabilities: Secondary | ICD-10-CM

## 2017-11-02 DIAGNOSIS — Z560 Unemployment, unspecified: Secondary | ICD-10-CM

## 2017-11-02 DIAGNOSIS — M1991 Primary osteoarthritis, unspecified site: Secondary | ICD-10-CM | POA: Diagnosis not present

## 2017-11-02 DIAGNOSIS — M2550 Pain in unspecified joint: Secondary | ICD-10-CM | POA: Diagnosis not present

## 2017-11-02 DIAGNOSIS — R450 Nervousness: Secondary | ICD-10-CM | POA: Diagnosis not present

## 2017-11-02 DIAGNOSIS — G4700 Insomnia, unspecified: Secondary | ICD-10-CM | POA: Diagnosis not present

## 2017-11-02 MED ORDER — DULOXETINE HCL 30 MG PO CPEP: ORAL_CAPSULE | ORAL | 0 refills | Status: DC

## 2017-11-02 NOTE — Patient Instructions (Signed)
1. Increase duloxetine 90 mg daily (60 mg + 30 mg) 2. Continue therapy 3. Return to clinic in one month for 30 mins

## 2017-11-09 DIAGNOSIS — N342 Other urethritis: Secondary | ICD-10-CM | POA: Diagnosis not present
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
## 2017-11-30 DIAGNOSIS — E119 Type 2 diabetes mellitus without complications: Secondary | ICD-10-CM | POA: Diagnosis not present

## 2017-12-03 ENCOUNTER — Ambulatory Visit (HOSPITAL_COMMUNITY): Payer: Self-pay | Admitting: Psychiatry

## 2017-12-03 ENCOUNTER — Ambulatory Visit (HOSPITAL_COMMUNITY): Payer: Medicare Other | Admitting: Psychiatry

## 2017-12-03 ENCOUNTER — Encounter (HOSPITAL_COMMUNITY): Payer: Self-pay | Admitting: Psychiatry

## 2017-12-03 VITALS — BP 164/74 | HR 86 | Ht 63.5 in | Wt 241.0 lb

## 2017-12-03 DIAGNOSIS — F419 Anxiety disorder, unspecified: Secondary | ICD-10-CM

## 2017-12-03 DIAGNOSIS — Z91411 Personal history of adult psychological abuse: Secondary | ICD-10-CM

## 2017-12-03 DIAGNOSIS — Z79899 Other long term (current) drug therapy: Secondary | ICD-10-CM | POA: Diagnosis not present

## 2017-12-03 DIAGNOSIS — F331 Major depressive disorder, recurrent, moderate: Secondary | ICD-10-CM

## 2017-12-03 DIAGNOSIS — Z818 Family history of other mental and behavioral disorders: Secondary | ICD-10-CM | POA: Diagnosis not present

## 2017-12-03 DIAGNOSIS — G4700 Insomnia, unspecified: Secondary | ICD-10-CM | POA: Diagnosis not present

## 2017-12-03 DIAGNOSIS — I10 Essential (primary) hypertension: Secondary | ICD-10-CM | POA: Diagnosis not present

## 2017-12-03 DIAGNOSIS — E119 Type 2 diabetes mellitus without complications: Secondary | ICD-10-CM | POA: Diagnosis not present

## 2017-12-03 DIAGNOSIS — G245 Blepharospasm: Secondary | ICD-10-CM | POA: Diagnosis not present

## 2017-12-03 DIAGNOSIS — K219 Gastro-esophageal reflux disease without esophagitis: Secondary | ICD-10-CM | POA: Diagnosis not present

## 2017-12-03 MED ORDER — ZOLPIDEM TARTRATE 5 MG PO TABS: 5.0000 mg | ORAL_TABLET | Freq: Every evening | ORAL | 2 refills | Status: DC | PRN

## 2017-12-03 MED ORDER — DULOXETINE HCL 30 MG PO CPEP: ORAL_CAPSULE | ORAL | 0 refills | Status: DC

## 2017-12-03 MED ORDER — DULOXETINE HCL 60 MG PO CPEP: ORAL_CAPSULE | ORAL | 0 refills | Status: DC

## 2017-12-03 NOTE — Patient Instructions (Signed)
1.Continueduloxetine 90 mg daily (60 mg + 30 mg) 2. Decrease Ambien 5 mg at night as needed for sleep 3.Return to clinic inthree months for 15 mins 

## 2017-12-11 DIAGNOSIS — Z1389 Encounter for screening for other disorder: Secondary | ICD-10-CM | POA: Diagnosis not present

## 2017-12-11 DIAGNOSIS — M1990 Unspecified osteoarthritis, unspecified site: Secondary | ICD-10-CM | POA: Diagnosis not present

## 2017-12-11 DIAGNOSIS — E119 Type 2 diabetes mellitus without complications: Secondary | ICD-10-CM | POA: Diagnosis not present

## 2017-12-11 DIAGNOSIS — I10 Essential (primary) hypertension: Secondary | ICD-10-CM | POA: Diagnosis not present

## 2017-12-11 DIAGNOSIS — G245 Blepharospasm: Secondary | ICD-10-CM | POA: Diagnosis not present

## 2017-12-14 DIAGNOSIS — H5200 Hypermetropia, unspecified eye: Secondary | ICD-10-CM | POA: Diagnosis not present

## 2017-12-14 DIAGNOSIS — Z79899 Other long term (current) drug therapy: Secondary | ICD-10-CM | POA: Diagnosis not present

## 2017-12-14 DIAGNOSIS — H524 Presbyopia: Secondary | ICD-10-CM | POA: Diagnosis not present

## 2017-12-14 DIAGNOSIS — H2513 Age-related nuclear cataract, bilateral: Secondary | ICD-10-CM | POA: Diagnosis not present

## 2017-12-14 DIAGNOSIS — G245 Blepharospasm: Secondary | ICD-10-CM | POA: Diagnosis not present

## 2017-12-14 DIAGNOSIS — Z7984 Long term (current) use of oral hypoglycemic drugs: Secondary | ICD-10-CM | POA: Diagnosis not present

## 2017-12-14 DIAGNOSIS — E119 Type 2 diabetes mellitus without complications: Secondary | ICD-10-CM | POA: Diagnosis not present

## 2018-01-02 DIAGNOSIS — H02831 Dermatochalasis of right upper eyelid: Secondary | ICD-10-CM | POA: Diagnosis not present

## 2018-01-02 DIAGNOSIS — H02423 Myogenic ptosis of bilateral eyelids: Secondary | ICD-10-CM | POA: Diagnosis not present

## 2018-01-02 DIAGNOSIS — G245 Blepharospasm: Secondary | ICD-10-CM | POA: Diagnosis not present

## 2018-01-03 ENCOUNTER — Encounter: Payer: Self-pay | Admitting: Internal Medicine

## 2018-01-23 DIAGNOSIS — L821 Other seborrheic keratosis: Secondary | ICD-10-CM | POA: Diagnosis not present

## 2018-01-31 ENCOUNTER — Other Ambulatory Visit (HOSPITAL_COMMUNITY): Payer: Self-pay | Admitting: Psychiatry

## 2018-01-31 ENCOUNTER — Telehealth (HOSPITAL_COMMUNITY): Payer: Self-pay | Admitting: *Deleted

## 2018-01-31 MED ORDER — DULOXETINE HCL 60 MG PO CPEP: ORAL_CAPSULE | ORAL | 0 refills | Status: DC

## 2018-01-31 MED ORDER — DULOXETINE HCL 30 MG PO CPEP: ORAL_CAPSULE | ORAL | 0 refills | Status: DC

## 2018-01-31 NOTE — Telephone Encounter (Signed)
Ordered duloxetine. Ambien needs to be dispensed for 30 days each time (not 90 days) and I do not think [facility] approves this. She has one refill left at the original pharmacy, [place].

## 2018-01-31 NOTE — Telephone Encounter (Signed)
Dr Modesta Messing Patient called to request & inform about medication refills. Requesting refills on Ambien & Cymbalta.  Rx has been updated in system now using [facility] mail service . New scripts have to be sent

## 2018-02-11 ENCOUNTER — Other Ambulatory Visit (HOSPITAL_COMMUNITY): Payer: Self-pay | Admitting: Family Medicine

## 2018-02-11 DIAGNOSIS — Z1231 Encounter for screening mammogram for malignant neoplasm of breast: Secondary | ICD-10-CM

## 2018-02-20 ENCOUNTER — Encounter (HOSPITAL_COMMUNITY): Payer: Self-pay

## 2018-02-20 ENCOUNTER — Ambulatory Visit (HOSPITAL_COMMUNITY)
Admission: RE | Admit: 2018-02-20 | Discharge: 2018-02-20 | Disposition: A | Discharge status: Home / Self Care | Payer: Medicare Other | Source: Ambulatory Visit | Attending: Family Medicine | Admitting: Family Medicine

## 2018-02-20 DIAGNOSIS — Z1231 Encounter for screening mammogram for malignant neoplasm of breast: Secondary | ICD-10-CM | POA: Diagnosis not present

## 2018-03-05 ENCOUNTER — Ambulatory Visit (HOSPITAL_COMMUNITY): Payer: Self-pay | Admitting: Psychiatry

## 2018-03-06 ENCOUNTER — Telehealth (HOSPITAL_COMMUNITY): Payer: Self-pay | Admitting: *Deleted

## 2018-03-06 ENCOUNTER — Other Ambulatory Visit (HOSPITAL_COMMUNITY): Payer: Self-pay | Admitting: Psychiatry

## 2018-03-06 MED ORDER — ZOLPIDEM TARTRATE 5 MG PO TABS: 5.0000 mg | ORAL_TABLET | Freq: Every evening | ORAL | 0 refills | Status: DC | PRN

## 2018-03-06 NOTE — Telephone Encounter (Signed)
ordered

## 2018-03-06 NOTE — Telephone Encounter (Signed)
Dr Modesta Messing [facility] told patient she would need e-scribe for 90 day supply of Ambien. She will be out of med on Saturday

## 2018-03-06 NOTE — Telephone Encounter (Signed)
I asked patient if it's possible to use the [facility]? And she stated she doesn't have to pay a co pay With [facility]. And she would like her medicine's to be sent there.

## 2018-03-06 NOTE — Telephone Encounter (Signed)
Will she able to use other pharmacy to allow 30 days of medication, especially because she has an appointment with me soon (may change the dose)?

## 2018-03-07 NOTE — Progress Notes (Signed)
[place] [place] MD/PA/NP OP Progress Note  03/12/2018 10:17 AM Aimee Reed  MRN:  [id number]  Chief Complaint:  Chief Complaint    Depression; Follow-up     HPI:  Patient presents for follow-up appointment for depression. She has worsening back pain since last weekend.  She just got the shot and hopes it helps her pain. She is doing relatively well. She had 35 year wedding anniversary and went to [place]. She felt good that her husband was willing to try going there when the patient proposed it. She also talks about her granddaughter, who texts her a few times a week. She feels that she is not take things personally even when she gets some "remark" from others. She believes that duloxetine is helping her to feel that way. 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 She may try going to senior center as she tends to feels depressed when she does not do anything at home. She reports improves sleep with occasional night time awakening. She has good motivation and energy. She denies SI. She feels anxious, tense and has panic attacks when she goes to grocery store by herself or is in closed space. She denies irritability.   Per PMP,  Zolpidem filled on 03/06/2018 for 90 days   Visit Diagnosis:    ICD-10-CM   1. MDD (major depressive disorder), recurrent episode, moderate ([place]) F33.1     Past Psychiatric History: Please see initial evaluation for full details. I have reviewed the history. No updates at this time.     Past Medical History:  Past Medical History:  Diagnosis Date  . Anxiety   . Blepharospasm   . Depression   . Diabetes mellitus, type II ([place])   . GERD (gastroesophageal reflux disease)   . HTN (hypertension)   . Hyperlipidemia 03/11/2012  . Irritable bowel syndrome   . Overactive bladder   . Recurrent UTI    chronic macrodantin    Past Surgical History:  Procedure Laterality Date  . CARPAL TUNNEL RELEASE     both hands  . CHOLECYSTECTOMY     . COLONOSCOPY     Dr. Arnoldo Morale, around 2010  . COLONOSCOPY N/A 02/25/2013   AUQ:JFHLKTG polyps, next tcs 02/2018 give tubular adenomas  . ESOPHAGOGASTRODUODENOSCOPY  07/31/2011   YBW:LSLHTD cervical esophageal web s/p dilation,query short segment Barrett's esophagus s/p bx (Barrett's esophagus without dysplasia),small hiatal hernia. 60 French Maloney dilator.  . ESOPHAGOGASTRODUODENOSCOPY (EGD) WITH ESOPHAGEAL DILATION N/A 02/25/2013   SKA:JGOTLXBW distal esophagus/HH. next EGD 02/2016 given prior h/o Barrett's  . EYE SURGERY  November 2016   Eyelids, nerve issues    Family Psychiatric History: Please see initial evaluation for full details. I have reviewed the history. No updates at this time.     Family History:  Family History  Problem Relation Age of Onset  . Depression Father   . Ulcers Mother   . Alzheimer's disease Mother   . Dementia Mother   . Alzheimer's disease Maternal Grandmother   . Dementia Maternal Grandmother   . Diabetes Paternal Grandfather   . Other Paternal Grandmother        died during childbirth  . Other Brother        back problems  . Cancer Sister        lung  . Dementia Brother   . Alzheimer's disease Brother   . Colon cancer Neg Hx   . Liver disease Neg Hx   . ADD / ADHD  Neg Hx   . Alcohol abuse Neg Hx   . Drug abuse Neg Hx   . Anxiety disorder Neg Hx   . OCD Neg Hx   . Bipolar disorder Neg Hx   . Paranoid behavior Neg Hx   . Schizophrenia Neg Hx   . Seizures Neg Hx   . Sexual abuse Neg Hx   . Physical abuse Neg Hx     Social History:  Social History   Socioeconomic History  . Marital status: Married    Spouse name: Not on file  . Number of children: 2  . Years of education: Not on file  . Highest education level: Not on file  Occupational History  . Occupation: unemployed    Fish farm manager: UNEMPLOYED  Social Needs  . Financial resource strain: Not on file  . Food insecurity:    Worry: Not on file    Inability: Not on file  .  Transportation needs:    Medical: Not on file    Non-medical: Not on file  Tobacco Use  . Smoking status: Never Smoker  . Smokeless tobacco: Never Used  Substance and Sexual Activity  . Alcohol use: No  . Drug use: No  . Sexual activity: Not Currently    Birth control/protection: Post-menopausal  Lifestyle  . Physical activity:    Days per week: Not on file    Minutes per session: Not on file  . Stress: Not on file  Relationships  . Social connections:    Talks on phone: Not on file    Gets together: Not on file    Attends religious service: Not on file    Active member of club or organization: Not on file    Attends meetings of clubs or organizations: Not on file    Relationship status: Not on file  Other Topics Concern  . Not on file  Social History Narrative  . Not on file    Allergies: No Known Allergies  Metabolic Disorder Labs: Lab Results  Component Value Date   HGBA1C 6.6 (H) 12/28/2016   No results found for: PROLACTIN Lab Results  Component Value Date   CHOL 217 (H) 04/30/2017   TRIG 149 04/30/2017   HDL 56 04/30/2017   CHOLHDL 3.9 04/30/2017   LDLCALC 131 (H) 04/30/2017   LDLCALC 76 10/22/2015   No results found for: TSH  Therapeutic Level Labs: No results found for: LITHIUM No results found for: VALPROATE No components found for:  CBMZ  Current Medications: Current Outpatient Medications  Medication Sig Dispense Refill  . aspirin EC 81 MG tablet Take 81 mg by mouth daily.      . celecoxib (CELEBREX) 200 MG capsule Take 200 mg by mouth daily as needed.    . DULoxetine (CYMBALTA) 30 MG capsule 90 mg daily (60 mg + 30 mg) 90 capsule 0  . DULoxetine (CYMBALTA) 60 MG capsule Total of 90 mg daily (60+ 30 mg) 90 capsule 0  . JARDIANCE 25 MG TABS tablet TAKE 1 TABLET BY MOUTH  DAILY 90 tablet 0  . loratadine (CLARITIN) 10 MG tablet Take 10 mg by mouth daily.    Marland Kitchen losartan (COZAAR) 100 MG tablet Take 100 mg by mouth daily.    . Melatonin 3 MG TABS  Take 5 mg by mouth at bedtime.     . Multiple Vitamins-Minerals (HAIR SKIN NAILS PO) Take by mouth daily.    Marland Kitchen omeprazole (PRILOSEC) 40 MG capsule Take 40 mg by mouth 2 (two) times  daily.    . OnabotulinumtoxinA (BOTOX IJ) Inject as directed. Every 3 months    . propranolol (INDERAL) 10 MG tablet Take 1-2 tablets (10-20 mg total) by mouth 2 (two) times daily. Takes 2 tablets (20mg ) in the mornings and 1 tablet (10mg ) at bedtime 90 tablet 3  . simvastatin (ZOCOR) 20 MG tablet Take 20 mg by mouth at bedtime.    . sitaGLIPtin-metformin (JANUMET) 50-1000 MG tablet Take 1 tablet by mouth 2 (two) times daily with a meal.    . vitamin B-12 (CYANOCOBALAMIN) 1000 MCG tablet Take 1,000 mcg by mouth daily.    Marland Kitchen zolpidem (AMBIEN) 5 MG tablet Take 1 tablet (5 mg total) by mouth at bedtime as needed for sleep. 90 tablet 0   No current facility-administered medications for this visit.      Musculoskeletal: Strength & Muscle Tone: within normal limits Gait & Station: normal Patient leans: N/A  Psychiatric Specialty Exam: Review of Systems  Psychiatric/Behavioral: Positive for depression. Negative for hallucinations, memory loss, substance abuse and suicidal ideas. The patient is nervous/anxious and has insomnia.   All other systems reviewed and are negative.   Blood pressure (!) 154/75, pulse 82, height 5' 3.5" (1.613 m), weight 242 lb (109.8 kg), SpO2 97 %.Body mass index is 42.2 kg/m.  General Appearance: Fairly Groomed, sit straight due to her back pain  Eye Contact:  Good  Speech:  Clear and Coherent  Volume:  Normal  Mood:  "good"  Affect:  Appropriate and Congruent  Thought Process:  Coherent  Orientation:  Full (Time, Place, and Person)  Thought Content: Logical   Suicidal Thoughts:  No  Homicidal Thoughts:  No  Memory:  Immediate;   Good  Judgement:  Good  Insight:  Fair  Psychomotor Activity:  Normal  Concentration:  Concentration: Good and Attention Span: Good  Recall:  Good   Fund of Knowledge: Good  Language: Good  Akathisia:  No  Handed:  Right  AIMS (if indicated): not done  Assets:  Communication Skills Desire for Improvement  ADL's:  Intact  Cognition: WNL  Sleep:  Fair   Screenings: PHQ2-9     Office Visit from 02/20/2017 in [facility] Office Visit from 01/04/2017 in [facility] Office Visit from 10/05/2016 in [facility] Office Visit from 01/28/2016 in [facility] Office Visit from 10/29/2015 in [facility]  PHQ-2 Total Score  2  0  0  0  0  PHQ-9 Total Score  9  -  -  -  -       Assessment and Plan:  Aimee Reed is a 62 y.o. year old female with a history of depression, anxiety, diabetes, hypertension,blepharospasm,GERD , who presents for follow up appointment for MDD (major depressive disorder), recurrent episode, moderate ([place])  # MDD, moderate, recurrent without psychotic features Patient reports that he improvement in neurovegetative symptoms since up titration of duloxetine.  Will continue current dose to target depression.  Discussed potential adverse reaction of hypertension.  Discussed cognitive diffusion.  Discussed behavioral activation.   # Insomnia Patient reports better sleep since the last appointment. Discussed sleep hygiene. She is advised to try lower dose of Ambien for insomnia.   Plan I have reviewed and updated plans as below 1. Continue duloxetine 90 mg daily (60 mg + 30 mg) 2. Continue Ambien 5 mg at night as needed for sleep 3.Return to clinic in three months for 15 mins - TSH wnl per patient, Dec 2018 -  She sees a therapist  The patient demonstrates the following risk factors for suicide: Chronic risk factors for suicide include:psychiatric disorder ofdepression. Acute risk factorsfor suicide include: family or marital conflict and unemployment. Protective factorsfor this patient include: coping skills and  hope for the future. Considering these factors, the overall suicide risk at this point appears to below. Patientisappropriate for outpatient follow up.  The duration of this appointment visit was 30 minutes of face-to-face time with the patient.  Greater than 50% of this time was spent in counseling, explanation of  diagnosis, planning of further management, and coordination of care.  Norman Clay, MD 03/12/2018, 10:17 AM

## 2018-03-12 ENCOUNTER — Encounter (HOSPITAL_COMMUNITY): Payer: Self-pay | Admitting: Psychiatry

## 2018-03-12 ENCOUNTER — Ambulatory Visit (INDEPENDENT_AMBULATORY_CARE_PROVIDER_SITE_OTHER): Payer: Medicare Other | Admitting: Psychiatry

## 2018-03-12 VITALS — BP 154/75 | HR 82 | Ht 63.5 in | Wt 242.0 lb

## 2018-03-12 DIAGNOSIS — F331 Major depressive disorder, recurrent, moderate: Secondary | ICD-10-CM

## 2018-03-12 DIAGNOSIS — M5410 Radiculopathy, site unspecified: Secondary | ICD-10-CM | POA: Diagnosis not present

## 2018-03-12 DIAGNOSIS — M545 Low back pain: Secondary | ICD-10-CM | POA: Diagnosis not present

## 2018-03-12 DIAGNOSIS — Z818 Family history of other mental and behavioral disorders: Secondary | ICD-10-CM

## 2018-03-12 MED ORDER — DULOXETINE HCL 30 MG PO CPEP: ORAL_CAPSULE | ORAL | 0 refills | Status: DC

## 2018-03-12 MED ORDER — DULOXETINE HCL 60 MG PO CPEP: ORAL_CAPSULE | ORAL | 0 refills | Status: DC

## 2018-03-12 NOTE — Patient Instructions (Addendum)
1. Continue duloxetine 90 mg daily (60 mg + 30 mg) 2. Continue Ambien 5 mg at night as needed for sleep 3. Return to clinic in three months for 15 mins

## 2018-03-13 ENCOUNTER — Other Ambulatory Visit: Payer: Medicare Other | Admitting: Adult Health

## 2018-04-17 ENCOUNTER — Encounter: Payer: Self-pay | Admitting: Adult Health

## 2018-04-17 ENCOUNTER — Ambulatory Visit (INDEPENDENT_AMBULATORY_CARE_PROVIDER_SITE_OTHER): Payer: Medicare Other | Admitting: Adult Health

## 2018-04-17 VITALS — BP 148/81 | HR 71 | Ht 63.0 in | Wt 243.0 lb

## 2018-04-17 DIAGNOSIS — N3946 Mixed incontinence: Secondary | ICD-10-CM

## 2018-04-17 DIAGNOSIS — Z1212 Encounter for screening for malignant neoplasm of rectum: Secondary | ICD-10-CM | POA: Diagnosis not present

## 2018-04-17 DIAGNOSIS — Z01419 Encounter for gynecological examination (general) (routine) without abnormal findings: Secondary | ICD-10-CM

## 2018-04-17 DIAGNOSIS — N390 Urinary tract infection, site not specified: Secondary | ICD-10-CM

## 2018-04-17 DIAGNOSIS — Z01411 Encounter for gynecological examination (general) (routine) with abnormal findings: Secondary | ICD-10-CM | POA: Diagnosis not present

## 2018-04-17 DIAGNOSIS — Z1211 Encounter for screening for malignant neoplasm of colon: Secondary | ICD-10-CM | POA: Insufficient documentation

## 2018-04-17 DIAGNOSIS — N812 Incomplete uterovaginal prolapse: Secondary | ICD-10-CM | POA: Insufficient documentation

## 2018-04-17 LAB — HEMOCCULT GUIAC POC 1CARD (OFFICE): Fecal Occult Blood, POC: NEGATIVE

## 2018-04-17 LAB — POCT URINALYSIS DIPSTICK
Glucose, UA: NEGATIVE
LEUKOCYTES UA: NEGATIVE
NITRITE UA: POSITIVE
PROTEIN UA: NEGATIVE
RBC UA: NEGATIVE

## 2018-04-17 MED ORDER — SULFAMETHOXAZOLE-TRIMETHOPRIM 800-160 MG PO TABS: 1.0000 | ORAL_TABLET | Freq: Two times a day (BID) | ORAL | 0 refills | Status: DC

## 2018-04-17 NOTE — Progress Notes (Signed)
Patient ID: Aimee Reed, female   DOB: July 29, 1956, 62 y.o.   MRN: [id number] History of Present Illness: Aimee Reed is a 62 year old white female, married in for well woman gyn exam,she had normal pap with negative HPV 02/20/17. PCP is Dr Hilma Favors.    Current Medications, Allergies, Past Medical History, Past Surgical History, Family History and Social History were reviewed in [employer] record.     Review of Systems: Patient denies any  hearing loss, fatigue, blurred vision, shortness of breath, chest pain, abdominal pain, problems with bowel movements, or intercourse(not active). No joint pain or mood swings. Has frequent headaches, and uses aleve with relief, and has UI, esp with standing up.Does have some ache in low back tops of legs at times.   Physical Exam:BP (!) 148/81 (BP Location: Left Arm, Patient Position: Sitting, Cuff Size: Large)   Pulse 71   Ht 5\' 3"  (1.6 m)   Wt 243 lb (110.2 kg)   BMI 43.05 kg/m   Urine dipstick +nitrates. General:  Well developed, well nourished, no acute distress Skin:  Warm and dry Neck:  Midline trachea, normal thyroid, good ROM, no lymphadenopathy,no carotid bruits heard Lungs; Clear to auscultation bilaterally Breast:  No dominant palpable mass, retraction, or nipple discharge Cardiovascular: Regular rate and rhythm Abdomen:  Soft, non tender, no hepatosplenomegaly Pelvic:  External genitalia is normal in appearance, no lesions.  The vagina is normal in appearance for age with loss of moisture and rugae and is pale, +cystocele. Urethra has no lesions or masses. The cervix is smooth.  Uterus is felt to be normal size, shape, and contour.  No adnexal masses or tenderness noted.Bladder is non tender, no masses felt. Rectal: Good sphincter tone, no polyps, or hemorrhoids felt.  Hemoccult negative.+rectocele Extremities/musculoskeletal:  No swelling or varicosities noted, no clubbing or cyanosis Psych:  No mood changes, alert and  cooperative,seems happy PHQ 9 score 16, is on meds and sees psychiatrist, denies being suicidal.  Discussed trying pessary to see if helps.  Impression: 1. Encounter for well woman exam with routine gynecological exam   2. Screening for colorectal cancer   3. Mixed stress and urge urinary incontinence   4. 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 Urinary tract infection without hematuria, site unspecified       Plan:  Meds ordered this encounter  Medications  . sulfamethoxazole-trimethoprim (BACTRIM DS,SEPTRA DS) 800-160 MG tablet    Sig: Take 1 tablet by mouth 2 (two) times daily. Take 1 bid    Dispense:  14 tablet    Refill:  0    Order Specific Question:   Supervising Provider    Answer:   Florian Buff [[id number]]  [place] C&S sent Return in about 4 weeks for pessary fitting Review handout on pelvic organ prolapse  Physical in 1 year  Pap in 2021 Labs with PCP

## 2018-04-17 NOTE — Patient Instructions (Signed)
Pelvic Organ Prolapse Pelvic organ prolapse is the stretching, bulging, or dropping of pelvic organs into an abnormal position. It happens when the muscles and tissues that surround and support pelvic structures are stretched or weak. Pelvic organ prolapse can involve:  Vagina (vaginal prolapse).  Uterus (uterine prolapse).  Bladder (cystocele).  Rectum (rectocele).  Intestines (enterocele).  When organs other than the vagina are involved, they often bulge into the vagina or protrude from the vagina, depending on how severe the prolapse is. What are the causes? Causes of this condition include:  Pregnancy, labor, and childbirth.  Long-lasting (chronic) cough.  Chronic constipation.  Obesity.  Past pelvic surgery.  Aging. During and after menopause, a decreased production of the hormone estrogen can weaken pelvic ligaments and muscles.  Consistently lifting more than 50 lb (23 kg).  Buildup of fluid in the abdomen due to certain diseases and other conditions.  What are the signs or symptoms? Symptoms of this condition include:  Loss of bladder control when you cough, sneeze, strain, and exercise (stress incontinence). This may be worse immediately following childbirth, and it may gradually improve over time.  Feeling pressure in your pelvis or vagina. This pressure may increase when you cough or when you are having a bowel movement.  A bulge that protrudes from the opening of your vagina or against your vaginal wall. If your uterus protrudes through the opening of your vagina and rubs against your clothing, you may also experience soreness, ulcers, infection, pain, and bleeding.  Increased effort to have a bowel movement or urinate.  Pain in your low back.  Pain, discomfort, or disinterest in sexual intercourse.  Repeated bladder infections (urinary tract infections).  Difficulty inserting or inability to insert a tampon or applicator.  In some people, this  condition does not cause any symptoms. How is this diagnosed? Your health care provider may perform an internal and external vaginal and rectal exam. During the exam, you may be asked to cough and strain while you are lying down, sitting, and standing up. Your health care provider will determine if other tests are required, such as bladder function tests. How is this treated? In most cases, this condition needs to be treated only if it produces symptoms. No treatment is guaranteed to correct the prolapse or relieve the symptoms completely. Treatment may include:  Lifestyle changes, such as: ? Avoiding drinking beverages that contain caffeine. ? Increasing your intake of high-fiber foods. This can help to decrease constipation and straining during bowel movements. ? Emptying your bladder at scheduled times (bladder training therapy). This can help to reduce or avoid urinary incontinence. ? Losing weight if you are overweight or obese.  Estrogen. Estrogen may help mild prolapse by increasing the strength and tone of pelvic floor muscles.  Kegel exercises. These may help mild cases of prolapse by strengthening and tightening the muscles of the pelvic floor.  Pessary insertion. A pessary is a soft, flexible device that is placed into your vagina by your health care provider to help support the vaginal walls and keep pelvic organs in place.  Surgery. This is often the only form of treatment for severe prolapse. Different types of surgeries are available.  Follow these instructions at home:  Wear a sanitary pad or absorbent product if you have urinary incontinence.  Avoid heavy lifting and straining with exercise and work. Do not hold your breath when you perform mild to moderate lifting and exercise activities. Limit your activities as directed by your health care   provider.  Take medicines only as directed by your health care provider.  Perform Kegel exercises as directed by your health care  provider.  If you have a pessary, take care of it as directed by your health care provider. Contact a health care provider if:  Your symptoms interfere with your daily activities or sex life.  You need medicine to help with the discomfort.  You notice bleeding from the vagina that is not related to your period.  You have a fever.  You have pain or bleeding when you urinate.  You have bleeding when you have a bowel movement.  You lose urine when you have sex.  You have chronic constipation.  You have a pessary that falls out.  You have vaginal discharge that has a bad smell.  You have low abdominal pain or cramping that is unusual for you. This information is not intended to replace advice given to you by your health care provider. Make sure you discuss any questions you have with your health care provider. Document Released: 03/25/2014 Document Revised: 02/03/2016 Document Reviewed: 11/10/2013 Elsevier Interactive Patient Education  2018 Elsevier Inc.  

## 2018-04-18 LAB — MICROSCOPIC EXAMINATION: Casts: NONE SEEN /lpf

## 2018-04-18 LAB — URINALYSIS, ROUTINE W REFLEX MICROSCOPIC
BILIRUBIN UA: NEGATIVE
KETONES UA: NEGATIVE
Leukocytes, UA: NEGATIVE
NITRITE UA: POSITIVE — AB
Protein, UA: NEGATIVE
RBC UA: NEGATIVE
Specific Gravity, UA: >=1.03 — AB (ref 1.005–1.030)
UUROB: 0.2 mg/dL (ref 0.2–1.0)
pH, UA: 6 (ref 5.0–7.5)

## 2018-04-19 LAB — URINE CULTURE

## 2018-05-02 DIAGNOSIS — I10 Essential (primary) hypertension: Secondary | ICD-10-CM | POA: Diagnosis not present

## 2018-05-02 DIAGNOSIS — E782 Mixed hyperlipidemia: Secondary | ICD-10-CM | POA: Diagnosis not present

## 2018-05-02 DIAGNOSIS — G4733 Obstructive sleep apnea (adult) (pediatric): Secondary | ICD-10-CM | POA: Diagnosis not present

## 2018-05-02 DIAGNOSIS — Z1389 Encounter for screening for other disorder: Secondary | ICD-10-CM | POA: Diagnosis not present

## 2018-05-02 DIAGNOSIS — Z0001 Encounter for general adult medical examination with abnormal findings: Secondary | ICD-10-CM | POA: Diagnosis not present

## 2018-05-02 DIAGNOSIS — E1165 Type 2 diabetes mellitus with hyperglycemia: Secondary | ICD-10-CM | POA: Diagnosis not present

## 2018-05-06 DIAGNOSIS — G245 Blepharospasm: Secondary | ICD-10-CM | POA: Diagnosis not present

## 2018-05-06 DIAGNOSIS — H02831 Dermatochalasis of right upper eyelid: Secondary | ICD-10-CM | POA: Diagnosis not present

## 2018-05-06 DIAGNOSIS — H02423 Myogenic ptosis of bilateral eyelids: Secondary | ICD-10-CM | POA: Diagnosis not present

## 2018-05-20 ENCOUNTER — Encounter: Payer: Self-pay | Admitting: Obstetrics & Gynecology

## 2018-05-20 ENCOUNTER — Ambulatory Visit: Payer: Medicare Other | Admitting: Obstetrics & Gynecology

## 2018-05-20 VITALS — BP 142/79 | HR 73 | Ht 63.0 in | Wt 241.0 lb

## 2018-05-20 DIAGNOSIS — N3281 Overactive bladder: Secondary | ICD-10-CM

## 2018-05-20 DIAGNOSIS — K5909 Other constipation: Secondary | ICD-10-CM | POA: Diagnosis not present

## 2018-05-20 DIAGNOSIS — N816 Rectocele: Secondary | ICD-10-CM

## 2018-05-20 DIAGNOSIS — N3946 Mixed incontinence: Secondary | ICD-10-CM

## 2018-05-20 MED ORDER — POLYETHYLENE GLYCOL 3350 17 GM/SCOOP PO POWD: ORAL | 11 refills | Status: DC

## 2018-05-20 MED ORDER — SOLIFENACIN SUCCINATE 10 MG PO TABS: 10.0000 mg | ORAL_TABLET | Freq: Every day | ORAL | 3 refills | Status: DC

## 2018-05-20 NOTE — Progress Notes (Signed)
Chief Complaint  Patient presents with  . Pessary Fitting      62 y.o. [id number] No LMP recorded. Patient is postmenopausal. The current method of family planning is tubal ligation.  Outpatient Encounter Medications as of 05/20/2018  Medication Sig Note  . aspirin EC 81 MG tablet Take 81 mg by mouth daily.     . Calcium-Magnesium-Vitamin D (CALCIUM 1200+D3) 600-40-500 MG-MG-UNIT TB24 Take by mouth daily.   . celecoxib (CELEBREX) 200 MG capsule Take 200 mg by mouth daily as needed.   . DULoxetine (CYMBALTA) 30 MG capsule 90 mg daily (60 mg + 30 mg)   . DULoxetine (CYMBALTA) 60 MG capsule Total of 90 mg daily (60+ 30 mg)   . JARDIANCE 25 MG TABS tablet TAKE 1 TABLET BY MOUTH  DAILY   . loratadine (CLARITIN) 10 MG tablet Take 10 mg by mouth daily.   Marland Kitchen losartan (COZAAR) 100 MG tablet Take 100 mg by mouth daily.   . Multiple Vitamins-Minerals (HAIR SKIN NAILS PO) Take by mouth daily.   Marland Kitchen omeprazole (PRILOSEC) 40 MG capsule Take 40 mg by mouth 2 (two) times daily. 08/02/2015: Received Sig:   . OnabotulinumtoxinA (BOTOX IJ) Inject as directed. Every 4 months   . propranolol (INDERAL) 10 MG tablet Take 1-2 tablets (10-20 mg total) by mouth 2 (two) times daily. Takes 2 tablets (20mg ) in the mornings and 1 tablet (10mg ) at bedtime   . simvastatin (ZOCOR) 20 MG tablet Take 20 mg by mouth at bedtime.   . sitaGLIPtin-metformin (JANUMET) 50-1000 MG tablet Take 1 tablet by mouth 2 (two) times daily with a meal.   . sulfamethoxazole-trimethoprim (BACTRIM DS,SEPTRA DS) 800-160 MG tablet Take 1 tablet by mouth 2 (two) times daily. Take 1 bid   . vitamin B-12 (CYANOCOBALAMIN) 1000 MCG tablet Take 1,000 mcg by mouth daily.   Marland Kitchen zolpidem (AMBIEN) 5 MG tablet Take 1 tablet (5 mg total) by mouth at bedtime as needed for sleep.   . polyethylene glycol powder (GLYCOLAX/MIRALAX) powder 1 scoop daily or as needed   . solifenacin (VESICARE) 10 MG tablet Take 1 tablet (10 mg total) by mouth daily.    No  facility-administered encounter medications on file as of 05/20/2018.     Subjective See in follow up from [employer] appt 1 month ago, which is noted  Loses urine with minimal stress, has small window before has to use bathroom occasional loss  +constipation long stanidng Small window as well Past Medical History:  Diagnosis Date  . Anxiety   . Blepharospasm   . Depression   . Diabetes mellitus, type II ([place])   . GERD (gastroesophageal reflux disease)   . HTN (hypertension)   . Hyperlipidemia 03/11/2012  . Irritable bowel syndrome   . Overactive bladder   . Recurrent UTI    chronic macrodantin    Past Surgical History:  Procedure Laterality Date  . CARPAL TUNNEL RELEASE     both hands  . CHOLECYSTECTOMY    . COLONOSCOPY     Dr. Arnoldo Morale, around 2010  . COLONOSCOPY N/A 02/25/2013   BJY:[id number] polyps, next tcs 02/2018 give tubular adenomas  . ESOPHAGOGASTRODUODENOSCOPY  07/31/2011   ZHY:[id number] cervical esophageal web s/p dilation,query short segment Barrett's esophagus s/p bx (Barrett's esophagus without dysplasia),small hiatal hernia. 48 French Maloney dilator.  . ESOPHAGOGASTRODUODENOSCOPY (EGD) WITH ESOPHAGEAL DILATION N/A 02/25/2013   [id number] distal esophagus/HH. next EGD 02/2016 given prior h/o Barrett's  . EYE SURGERY  November 2016  Eyelids, nerve issues    OB History    Gravida  2   Para  2   Term  2   Preterm      AB      Living  2     SAB      TAB      Ectopic      Multiple      Live Births  2           No Known Allergies  Social History   Socioeconomic History  . Marital status: Married    Spouse name: Not on file  . Number of children: 2  . Years of education: Not on file  . Highest education level: Not on file  Occupational History  . Occupation: unemployed    Fish farm manager: UNEMPLOYED  Social Needs  . Financial resource strain: Not on file  . Food insecurity:    Worry: Not on file    Inability: Not on file  .  Transportation needs:    Medical: Not on file    Non-medical: Not on file  Tobacco Use  . Smoking status: Never Smoker  . Smokeless tobacco: Never Used  Substance and Sexual Activity  . Alcohol use: No  . Drug use: No  . Sexual activity: Not Currently    Birth control/protection: Post-menopausal  Lifestyle  . Physical activity:    Days per week: Not on file    Minutes per session: Not on file  . Stress: Not on file  Relationships  . Social connections:    Talks on phone: Not on file    Gets together: Not on file    Attends religious service: Not on file    Active member of club or organization: Not on file    Attends meetings of clubs or organizations: Not on file    Relationship status: Not on file  Other Topics Concern  . Not on file  Social History Narrative  . Not on file    Family History  Problem Relation Age of Onset  . Depression Father   . Ulcers Mother   . Alzheimer's disease Mother   . Dementia Mother   . Alzheimer's disease Maternal Grandmother   . Dementia Maternal Grandmother   . Diabetes Paternal Grandfather   . Other Paternal Grandmother        died during childbirth  . Other Brother        back problems  . Cancer Sister        lung  . Dementia Brother   . Alzheimer's disease Brother   . Colon cancer Neg Hx   . Liver disease Neg Hx   . ADD / ADHD Neg Hx   . Alcohol abuse Neg Hx   . Drug abuse Neg Hx   . Anxiety disorder Neg Hx   . OCD Neg Hx   . Bipolar disorder Neg Hx   . Paranoid behavior Neg Hx   . Schizophrenia Neg Hx   . Seizures Neg Hx   . Sexual abuse Neg Hx   . Physical abuse Neg Hx     Medications:       Current Outpatient Medications:  .  aspirin EC 81 MG tablet, Take 81 mg by mouth daily.  , Disp: , Rfl:  .  Calcium-Magnesium-Vitamin D (CALCIUM 1200+D3) 600-40-500 MG-MG-UNIT TB24, Take by mouth daily., Disp: , Rfl:  .  celecoxib (CELEBREX) 200 MG capsule, Take 200 mg by mouth daily as needed., Disp: ,  Rfl:  .  DULoxetine  (CYMBALTA) 30 MG capsule, 90 mg daily (60 mg + 30 mg), Disp: 90 capsule, Rfl: 0 .  DULoxetine (CYMBALTA) 60 MG capsule, Total of 90 mg daily (60+ 30 mg), Disp: 90 capsule, Rfl: 0 .  JARDIANCE 25 MG TABS tablet, TAKE 1 TABLET BY MOUTH  DAILY, Disp: 90 tablet, Rfl: 0 .  loratadine (CLARITIN) 10 MG tablet, Take 10 mg by mouth daily., Disp: , Rfl:  .  losartan (COZAAR) 100 MG tablet, Take 100 mg by mouth daily., Disp: , Rfl:  .  Multiple Vitamins-Minerals (HAIR SKIN NAILS PO), Take by mouth daily., Disp: , Rfl:  .  omeprazole (PRILOSEC) 40 MG capsule, Take 40 mg by mouth 2 (two) times daily., Disp: , Rfl:  .  OnabotulinumtoxinA (BOTOX IJ), Inject as directed. Every 4 months, Disp: , Rfl:  .  propranolol (INDERAL) 10 MG tablet, Take 1-2 tablets (10-20 mg total) by mouth 2 (two) times daily. Takes 2 tablets (20mg ) in the mornings and 1 tablet (10mg ) at bedtime, Disp: 90 tablet, Rfl: 3 .  simvastatin (ZOCOR) 20 MG tablet, Take 20 mg by mouth at bedtime., Disp: , Rfl:  .  sitaGLIPtin-metformin (JANUMET) 50-1000 MG tablet, Take 1 tablet by mouth 2 (two) times daily with a meal., Disp: , Rfl:  .  sulfamethoxazole-trimethoprim (BACTRIM DS,SEPTRA DS) 800-160 MG tablet, Take 1 tablet by mouth 2 (two) times daily. Take 1 bid, Disp: 14 tablet, Rfl: 0 .  vitamin B-12 (CYANOCOBALAMIN) 1000 MCG tablet, Take 1,000 mcg by mouth daily., Disp: , Rfl:  .  zolpidem (AMBIEN) 5 MG tablet, Take 1 tablet (5 mg total) by mouth at bedtime as needed for sleep., Disp: 90 tablet, Rfl: 0 .  polyethylene glycol powder (GLYCOLAX/MIRALAX) powder, 1 scoop daily or as needed, Disp: 255 g, Rfl: 11 .  solifenacin (VESICARE) 10 MG tablet, Take 1 tablet (10 mg total) by mouth daily., Disp: 90 tablet, Rfl: 3  Objective Blood pressure (!) 142/79, pulse 73, height 5\' 3"  (1.6 m), weight 241 lb (109.3 kg).  General WDWN female NAD Vulva:  normal appearing vulva with no masses, tenderness or lesions Vagina:  normal mucosa, no  discharge Moderate rectocoele, not bad uterine relaxation or bladder relaxation, mild, not clinically significant, recocoele is the problem Cervix:  no cervical motion tenderness and no lesions Uterus:  normal size, contour, position, consistency, mobility, non-tender Adnexa: ovaries:present,  normal adnexa in size, nontender and no masses   Pertinent ROS No burning with urination, frequency or urgency No nausea, vomiting or diarrhea Nor fever chills or other constitutional symptoms   Labs or studies     Impression Diagnoses this Encounter::   ICD-10-CM   1. Rectocele N81.6   2. Mixed stress and urge urinary incontinence N39.46   3. OAB (overactive bladder) N32.81   4. Other constipation K59.09     Established relevant diagnosis(es):   Plan/Recommendations: Meds ordered this encounter  Medications  . solifenacin (VESICARE) 10 MG tablet    Sig: Take 1 tablet (10 mg total) by mouth daily.    Dispense:  90 tablet    Refill:  3  . polyethylene glycol powder (GLYCOLAX/MIRALAX) powder    Sig: 1 scoop daily or as needed    Dispense:  255 g    Refill:  11    Labs or Scans Ordered: No orders of the defined types were placed in this encounter.   Management:: >No pessary placement today >miralax powder to help her constipation and hopefully  help her rectocoele >Vesicare for OAB  Honestly has very little anterior and apical relaxation Will try to fix her constipation which will hopefully help fix the rectocoele, othrwise consier rectocoele repair  Follow up Return in about 6 weeks (around 07/01/2018) for Follow up, with Dr Elonda Husky.       All questions were answered.

## 2018-05-22 ENCOUNTER — Telehealth: Payer: Self-pay | Admitting: Obstetrics & Gynecology

## 2018-05-22 NOTE — Telephone Encounter (Signed)
Patient states [facility] is charging her $125 for 90 day supply. Says [facility] will do 30 day supply for $14 and she would rather do that if you could send it there please?

## 2018-05-22 NOTE — Telephone Encounter (Signed)
Patient called stating that she would like for [place] to send her medication to the [facility] in [place], because he send her medication to mail order but they are asking for to much. Pt would like [place] to send in the generic form of the medication to the [facility]. Please contact pt

## 2018-05-22 NOTE — Telephone Encounter (Signed)
There is no generic for vesicare

## 2018-05-24 ENCOUNTER — Other Ambulatory Visit: Payer: Self-pay | Admitting: Obstetrics & Gynecology

## 2018-05-24 MED ORDER — SOLIFENACIN SUCCINATE 10 MG PO TABS: 10.0000 mg | ORAL_TABLET | Freq: Every day | ORAL | 11 refills | Status: DC

## 2018-05-24 NOTE — Telephone Encounter (Signed)
This message was sent previously but patient has called back.  States [facility] is charging her $125 for 90 day supply. Says [facility] will do 30 day supply for $14 and she would rather do that if you could send it there please?

## 2018-05-24 NOTE — Telephone Encounter (Signed)
done

## 2018-06-06 NOTE — Progress Notes (Signed)
BH MD/PA/NP OP Progress Note  06/12/2018 10:19 AM Aimee Reed  MRN:  [id number]  Chief Complaint:  Chief Complaint    Follow-up; Depression     HPI:  Patient presents for follow-up appointment for depression.  She states that she has been doing well.  She is looking forward to her granddaughter's wedding in [place].  She lost her pastor in August.  She has known him since she was 62-year-old.  He was like her father as well as an authority figure.  Although she still misses him, it has become less intense.  She occasionally feels fatigue.  She talks about her husband, who has stage IV kidney cancer.  She has better sleep.  She has been able to taper down Ambien.  She occasionally feels depressed.  She has fair concentration.  She denies SI.  She denies anxiety or panic attacks.   Per PMP,  Ambien filled on 03/06/2018    Visit Diagnosis:    ICD-10-CM   1. MDD (major depressive disorder), recurrent episode, moderate ([place]) F33.1     Past Psychiatric History: Please see initial evaluation for full details. I have reviewed the history. No updates at this time.     Past Medical History:  Past Medical History:  Diagnosis Date  . Anxiety   . Blepharospasm   . Depression   . Diabetes mellitus, type II ([place])   . GERD (gastroesophageal reflux disease)   . HTN (hypertension)   . Hyperlipidemia 03/11/2012  . Irritable bowel syndrome   . Overactive bladder   . Recurrent UTI    chronic macrodantin    Past Surgical History:  Procedure Laterality Date  . CARPAL TUNNEL RELEASE     both hands  . CHOLECYSTECTOMY    . COLONOSCOPY     Dr. Arnoldo Morale, around 2010  . COLONOSCOPY N/A 02/25/2013   [id number]:[id number] polyps, next tcs 02/2018 give tubular adenomas  . ESOPHAGOGASTRODUODENOSCOPY  07/31/2011   OVF:IEPPIR cervical esophageal web s/p dilation,query short segment Barrett's esophagus s/p bx (Barrett's esophagus without dysplasia),small hiatal hernia. 24 French Maloney dilator.  .  ESOPHAGOGASTRODUODENOSCOPY (EGD) WITH ESOPHAGEAL DILATION N/A 02/25/2013   JJO:[id number] distal esophagus/HH. next EGD 02/2016 given prior h/o Barrett's  . EYE SURGERY  November 2016   Eyelids, nerve issues    Family Psychiatric History: Please see initial evaluation for full details. I have reviewed the history. No updates at this time.     Family History:  Family History  Problem Relation Age of Onset  . Depression Father   . Ulcers Mother   . Alzheimer's disease Mother   . Dementia Mother   . Alzheimer's disease Maternal Grandmother   . Dementia Maternal Grandmother   . Diabetes Paternal Grandfather   . Other Paternal Grandmother        died during childbirth  . Other Brother        back problems  . Cancer Sister        lung  . Dementia Brother   . Alzheimer's disease Brother   . Colon cancer Neg Hx   . Liver disease Neg Hx   . ADD / ADHD Neg Hx   . Alcohol abuse Neg Hx   . Drug abuse Neg Hx   . Anxiety disorder Neg Hx   . OCD Neg Hx   . Bipolar disorder Neg Hx   . Paranoid behavior Neg Hx   . Schizophrenia Neg Hx   . Seizures Neg Hx   . Sexual abuse Neg Hx   .  Physical abuse Neg Hx     Social History:  Social History   Socioeconomic History  . Marital status: Married    Spouse name: Not on file  . Number of children: 2  . Years of education: Not on file  . Highest education level: Not on file  Occupational History  . Occupation: unemployed    Fish farm manager: UNEMPLOYED  Social Needs  . Financial resource strain: Not on file  . Food insecurity:    Worry: Not on file    Inability: Not on file  . Transportation needs:    Medical: Not on file    Non-medical: Not on file  Tobacco Use  . Smoking status: Never Smoker  . Smokeless tobacco: Never Used  Substance and Sexual Activity  . Alcohol use: No  . Drug use: No  . Sexual activity: Not Currently    Birth control/protection: Post-menopausal  Lifestyle  . Physical activity:    Days per week: Not on file     Minutes per session: Not on file  . Stress: Not on file  Relationships  . Social connections:    Talks on phone: Not on file    Gets together: Not on file    Attends religious service: Not on file    Active member of club or organization: Not on file    Attends meetings of clubs or organizations: Not on file    Relationship status: Not on file  Other Topics Concern  . Not on file  Social History Narrative  . Not on file    Allergies: No Known Allergies  Metabolic Disorder Labs: Lab Results  Component Value Date   HGBA1C 6.6 (H) 12/28/2016   No results found for: PROLACTIN Lab Results  Component Value Date   CHOL 217 (H) 04/30/2017   TRIG 149 04/30/2017   HDL 56 04/30/2017   CHOLHDL 3.9 04/30/2017   LDLCALC 131 (H) 04/30/2017   LDLCALC 76 10/22/2015   No results found for: TSH  Therapeutic Level Labs: No results found for: LITHIUM No results found for: VALPROATE No components found for:  CBMZ  Current Medications: Current Outpatient Medications  Medication Sig Dispense Refill  . aspirin EC 81 MG tablet Take 81 mg by mouth daily.      . Calcium-Magnesium-Vitamin D (CALCIUM 1200+D3) 600-40-500 MG-MG-UNIT TB24 Take by mouth daily.    . celecoxib (CELEBREX) 200 MG capsule Take 200 mg by mouth daily as needed.    . DULoxetine (CYMBALTA) 30 MG capsule 90 mg daily (60 mg + 30 mg) 90 capsule 0  . DULoxetine (CYMBALTA) 60 MG capsule Total of 90 mg daily (60+ 30 mg) 90 capsule 0  . JARDIANCE 25 MG TABS tablet TAKE 1 TABLET BY MOUTH  DAILY 90 tablet 0  . loratadine (CLARITIN) 10 MG tablet Take 10 mg by mouth daily.    Marland Kitchen losartan (COZAAR) 100 MG tablet Take 100 mg by mouth daily.    . Multiple Vitamins-Minerals (HAIR SKIN NAILS PO) Take by mouth daily.    Marland Kitchen omeprazole (PRILOSEC) 40 MG capsule Take 40 mg by mouth 2 (two) times daily.    . OnabotulinumtoxinA (BOTOX IJ) Inject as directed. Every 4 months    . polyethylene glycol powder (GLYCOLAX/MIRALAX) powder 1 scoop daily or  as needed 255 g 11  . propranolol (INDERAL) 10 MG tablet Take 1-2 tablets (10-20 mg total) by mouth 2 (two) times daily. Takes 2 tablets (20mg ) in the mornings and 1 tablet (10mg ) at bedtime 90 tablet  3  . simvastatin (ZOCOR) 20 MG tablet Take 20 mg by mouth at bedtime.    . sitaGLIPtin-metformin (JANUMET) 50-1000 MG tablet Take 1 tablet by mouth 2 (two) times daily with a meal.    . solifenacin (VESICARE) 10 MG tablet Take 1 tablet (10 mg total) by mouth daily. 30 tablet 11  . sulfamethoxazole-trimethoprim (BACTRIM DS,SEPTRA DS) 800-160 MG tablet Take 1 tablet by mouth 2 (two) times daily. Take 1 bid 14 tablet 0  . vitamin B-12 (CYANOCOBALAMIN) 1000 MCG tablet Take 1,000 mcg by mouth daily.    Marland Kitchen zolpidem (AMBIEN) 5 MG tablet Take 0.5 tablets (2.5 mg total) by mouth at bedtime as needed for sleep. 45 tablet 0   No current facility-administered medications for this visit.      Musculoskeletal: Strength & Muscle Tone: within normal limits Gait & Station: normal Patient leans: N/A  Psychiatric Specialty Exam: ROS  Blood pressure 135/80, pulse 73, height 5\' 3"  (1.6 m), weight 242 lb (109.8 kg), SpO2 95 %.Body mass index is 42.87 kg/m.  General Appearance: Fairly Groomed  Eye Contact:  Good  Speech:  Clear and Coherent  Volume:  Normal  Mood:  "good"  Affect:  Appropriate, Congruent and Full Range  Thought Process:  Coherent  Orientation:  Full (Time, Place, and Person)  Thought Content: Logical   Suicidal Thoughts:  No  Homicidal Thoughts:  No  Memory:  Immediate;   Good  Judgement:  Good  Insight:  Fair  Psychomotor Activity:  Normal  Concentration:  Concentration: Good and Attention Span: Good  Recall:  Good  Fund of Knowledge: Good  Language: Good  Akathisia:  No  Handed:  Right  AIMS (if indicated): not done  Assets:  Communication Skills Desire for Improvement  ADL's:  Intact  Cognition: WNL  Sleep:  Fair   Screenings: PHQ2-9     Office Visit from 04/17/2018 in  [facility] Office Visit from 02/20/2017 in [place] Office Visit from 01/04/2017 in [facility] Office Visit from 10/05/2016 in [facility] Office Visit from 01/28/2016 in [facility]  PHQ-2 Total Score  6  2  0  0  0  PHQ-9 Total Score  16  9  -  -  -       Assessment and Plan:  TYRIANA HELMKAMP is a 62 y.o. year old female with a history of depression, anxiety, diabetes, hypertension,blepharospasm,GERD , who presents for follow up appointment for MDD (major depressive disorder), recurrent episode, moderate ([place])  # MDD, moderate, recurrent without psychotic features Patient reports steady improvement in neurovegetative symptoms since up titration of duloxetine.  Will continue current dose to target depression.  Discussed potential adverse reaction of hypertension.  Discussed behavioral activation.  Validated her grief of loss of her pastor.   # Insomnia There has been improvement in insomnia.  She is advised to taper off Ambien if able.   Plan I have reviewed and updated plans as below 1.Continueduloxetine 90 mg daily (60 mg + 30 mg)- only refilled 30 mg as she has enough 60 mg per report 2. Decrease Ambien 2.5 mg at night as needed for sleep 3.Return to clinic inthree months for 15 mins -TSH wnl per patient, Dec 2018 - She sees a therapist  The patient demonstrates the following risk factors for suicide: Chronic risk factors for suicide include:psychiatric disorder ofdepression. Acute risk factorsfor suicide include: family or marital conflict and unemployment. Protective factorsfor this patient include: coping skills  and hope for the future. Considering these factors, the overall suicide risk at this point appears to below. Patientisappropriate for outpatient follow up.  Norman Clay, MD 06/12/2018, 10:19 AM

## 2018-06-12 ENCOUNTER — Encounter (HOSPITAL_COMMUNITY): Payer: Self-pay | Admitting: Psychiatry

## 2018-06-12 ENCOUNTER — Ambulatory Visit (INDEPENDENT_AMBULATORY_CARE_PROVIDER_SITE_OTHER): Payer: Medicare Other | Admitting: Psychiatry

## 2018-06-12 VITALS — BP 135/80 | HR 73 | Ht 63.0 in | Wt 242.0 lb

## 2018-06-12 DIAGNOSIS — Z560 Unemployment, unspecified: Secondary | ICD-10-CM

## 2018-06-12 DIAGNOSIS — F331 Major depressive disorder, recurrent, moderate: Secondary | ICD-10-CM | POA: Diagnosis not present

## 2018-06-12 DIAGNOSIS — G4700 Insomnia, unspecified: Secondary | ICD-10-CM

## 2018-06-12 MED ORDER — ZOLPIDEM TARTRATE 5 MG PO TABS: 2.5000 mg | ORAL_TABLET | Freq: Every evening | ORAL | 0 refills | Status: DC | PRN

## 2018-06-12 MED ORDER — DULOXETINE HCL 30 MG PO CPEP: ORAL_CAPSULE | ORAL | 0 refills | Status: DC

## 2018-06-12 NOTE — Patient Instructions (Signed)
1.Continueduloxetine 90 mg daily (60 mg + 30 mg) 2. Decrease Ambien 5 mg at night as needed for sleep 3.Return to clinic inthree months for 15 mins

## 2018-06-18 DIAGNOSIS — R350 Frequency of micturition: Secondary | ICD-10-CM | POA: Diagnosis not present

## 2018-06-18 DIAGNOSIS — N342 Other urethritis: Secondary | ICD-10-CM | POA: Diagnosis not present

## 2018-07-01 ENCOUNTER — Ambulatory Visit: Payer: Medicare Other | Admitting: Obstetrics & Gynecology

## 2018-07-11 DIAGNOSIS — Z23 Encounter for immunization: Secondary | ICD-10-CM | POA: Diagnosis not present

## 2018-07-25 ENCOUNTER — Other Ambulatory Visit (HOSPITAL_COMMUNITY): Payer: Self-pay | Admitting: Psychiatry

## 2018-07-25 ENCOUNTER — Telehealth (HOSPITAL_COMMUNITY): Payer: Self-pay | Admitting: *Deleted

## 2018-07-25 MED ORDER — ZOLPIDEM TARTRATE 5 MG PO TABS: ORAL_TABLET | ORAL | 0 refills | Status: AC

## 2018-07-25 NOTE — Telephone Encounter (Signed)
Dr. Hisada's pt 

## 2018-07-25 NOTE — Telephone Encounter (Signed)
Ordered ambien 2.5-5 mg at night as needed fors sleep. She will have enough meds until the next appointment. Please advise the patient to continue to try decrease the dose if able.

## 2018-07-25 NOTE — Telephone Encounter (Signed)
Dr Harrington Challenger Patient called front desk LVM requesting refill on Ambien. I called & asked if she was taking the 2.5mg   Or is taking the whole tablet 5 mg. She stated some night she can do a 2.5 mg but most night she takes the  5 mg   Rescheduled her 09/09/18 appointment because she has a scheduling conflict with another appointment

## 2018-08-26 DIAGNOSIS — R350 Frequency of micturition: Secondary | ICD-10-CM | POA: Diagnosis not present

## 2018-08-26 DIAGNOSIS — N342 Other urethritis: Secondary | ICD-10-CM | POA: Diagnosis not present

## 2018-08-26 DIAGNOSIS — N390 Urinary tract infection, site not specified: Secondary | ICD-10-CM | POA: Diagnosis not present

## 2018-08-30 DIAGNOSIS — E7849 Other hyperlipidemia: Secondary | ICD-10-CM | POA: Diagnosis not present

## 2018-08-30 DIAGNOSIS — I10 Essential (primary) hypertension: Secondary | ICD-10-CM | POA: Diagnosis not present

## 2018-08-30 DIAGNOSIS — E119 Type 2 diabetes mellitus without complications: Secondary | ICD-10-CM | POA: Diagnosis not present

## 2018-08-30 DIAGNOSIS — E7841 Elevated Lipoprotein(a): Secondary | ICD-10-CM | POA: Diagnosis not present

## 2018-08-30 DIAGNOSIS — Z1389 Encounter for screening for other disorder: Secondary | ICD-10-CM | POA: Diagnosis not present

## 2018-09-09 ENCOUNTER — Ambulatory Visit (HOSPITAL_COMMUNITY): Payer: Medicare Other | Admitting: Psychiatry

## 2018-09-09 DIAGNOSIS — G245 Blepharospasm: Secondary | ICD-10-CM | POA: Diagnosis not present

## 2018-09-09 DIAGNOSIS — H02423 Myogenic ptosis of bilateral eyelids: Secondary | ICD-10-CM | POA: Diagnosis not present

## 2018-09-16 ENCOUNTER — Ambulatory Visit (HOSPITAL_COMMUNITY): Payer: Medicare Other | Admitting: Psychiatry

## 2018-09-28 NOTE — Progress Notes (Signed)
BH MD/PA/NP OP Progress Note  09/30/2018 10:30 AM Aimee Reed  MRN:  [id number]  Chief Complaint:  Chief Complaint    Depression; Follow-up     HPI:  Patient presents for follow-up appointment for depression.  She states that she has been doing well.  She is worried about her husband, who has stage IV kidney cancer.  She has insomnia at times, being concerned about her husband condition.  Her granddaughter is doing great; she enjoyed her wedding.  She believes that "everything is going well."  She weaned off Ambien.  She has insomnia a few times per week; she is willing to be off Ambien.  She has fair appetite and energy.  She has fair concentration.  She denies SI.  She feels anxious and tense at times.  She denies panic attacks.   Ambien filled on 06/12/2018  Visit Diagnosis:    ICD-10-CM   1. MDD (major depressive disorder), recurrent episode, mild ([place]) F33.0       Past Psychiatric History:  Please see initial evaluation for full details. I have reviewed the history. No updates at this time.    Past Medical History:  Past Medical History:  Diagnosis Date  . Anxiety   . Blepharospasm   . Depression   . Diabetes mellitus, type II ([place])   . GERD (gastroesophageal reflux disease)   . HTN (hypertension)   . Hyperlipidemia 03/11/2012  . Irritable bowel syndrome   . Overactive bladder   . Recurrent UTI    chronic macrodantin    Past Surgical History:  Procedure Laterality Date  . CARPAL TUNNEL RELEASE     both hands  . CHOLECYSTECTOMY    . COLONOSCOPY     Dr. Arnoldo Morale, around 2010  . COLONOSCOPY N/A 02/25/2013   ZSW:[id number] polyps, next tcs 02/2018 give tubular adenomas  . ESOPHAGOGASTRODUODENOSCOPY  07/31/2011   TDD:UKGURK cervical esophageal web s/p dilation,query short segment Barrett's esophagus s/p bx (Barrett's esophagus without dysplasia),small hiatal hernia. 43 French Maloney dilator.  . ESOPHAGOGASTRODUODENOSCOPY (EGD) WITH ESOPHAGEAL DILATION N/A 02/25/2013    YHC:[id number] distal esophagus/HH. next EGD 02/2016 given prior h/o Barrett's  . EYE SURGERY  November 2016   Eyelids, nerve issues    Family Psychiatric History: Please see initial evaluation for full details. I have reviewed the history. No updates at this time.     Family History:  Family History  Problem Relation Age of Onset  . Depression Father   . Ulcers Mother   . Alzheimer's disease Mother   . Dementia Mother   . Alzheimer's disease Maternal Grandmother   . Dementia Maternal Grandmother   . Diabetes Paternal Grandfather   . Other Paternal Grandmother        died during childbirth  . Other Brother        back problems  . Cancer Sister        lung  . Dementia Brother   . Alzheimer's disease Brother   . Colon cancer Neg Hx   . Liver disease Neg Hx   . ADD / ADHD Neg Hx   . Alcohol abuse Neg Hx   . Drug abuse Neg Hx   . Anxiety disorder Neg Hx   . OCD Neg Hx   . Bipolar disorder Neg Hx   . Paranoid behavior Neg Hx   . Schizophrenia Neg Hx   . Seizures Neg Hx   . Sexual abuse Neg Hx   . Physical abuse Neg Hx     Social  History:  Social History   Socioeconomic History  . Marital status: Married    Spouse name: Not on file  . Number of children: 2  . Years of education: Not on file  . Highest education level: Not on file  Occupational History  . Occupation: unemployed    Fish farm manager: UNEMPLOYED  Social Needs  . Financial resource strain: Not on file  . Food insecurity:    Worry: Not on file    Inability: Not on file  . Transportation needs:    Medical: Not on file    Non-medical: Not on file  Tobacco Use  . Smoking status: Never Smoker  . Smokeless tobacco: Never Used  Substance and Sexual Activity  . Alcohol use: No  . Drug use: No  . Sexual activity: Not Currently    Birth control/protection: Post-menopausal  Lifestyle  . Physical activity:    Days per week: Not on file    Minutes per session: Not on file  . Stress: Not on file  Relationships   . Social connections:    Talks on phone: Not on file    Gets together: Not on file    Attends religious service: Not on file    Active member of club or organization: Not on file    Attends meetings of clubs or organizations: Not on file    Relationship status: Not on file  Other Topics Concern  . Not on file  Social History Narrative  . Not on file    Allergies: No Known Allergies  Metabolic Disorder Labs: Lab Results  Component Value Date   HGBA1C 6.6 (H) 12/28/2016   No results found for: PROLACTIN Lab Results  Component Value Date   CHOL 217 (H) 04/30/2017   TRIG 149 04/30/2017   HDL 56 04/30/2017   CHOLHDL 3.9 04/30/2017   LDLCALC 131 (H) 04/30/2017   LDLCALC 76 10/22/2015   No results found for: TSH  Therapeutic Level Labs: No results found for: LITHIUM No results found for: VALPROATE No components found for:  CBMZ  Current Medications: Current Outpatient Medications  Medication Sig Dispense Refill  . aspirin EC 81 MG tablet Take 81 mg by mouth daily.      . Calcium-Magnesium-Vitamin D (CALCIUM 1200+D3) 600-40-500 MG-MG-UNIT TB24 Take by mouth daily.    . celecoxib (CELEBREX) 200 MG capsule Take 200 mg by mouth daily as needed.    . DULoxetine (CYMBALTA) 30 MG capsule 90 mg daily (60 mg + 30 mg) 90 capsule 0  . DULoxetine (CYMBALTA) 60 MG capsule Total of 90 mg daily (60+ 30 mg) 90 capsule 0  . JARDIANCE 25 MG TABS tablet TAKE 1 TABLET BY MOUTH  DAILY 90 tablet 0  . loratadine (CLARITIN) 10 MG tablet Take 10 mg by mouth daily.    Marland Kitchen losartan (COZAAR) 100 MG tablet Take 100 mg by mouth daily.    . Multiple Vitamins-Minerals (HAIR SKIN NAILS PO) Take by mouth daily.    Marland Kitchen omeprazole (PRILOSEC) 40 MG capsule Take 40 mg by mouth 2 (two) times daily.    . OnabotulinumtoxinA (BOTOX IJ) Inject as directed. Every 4 months    . polyethylene glycol powder (GLYCOLAX/MIRALAX) powder 1 scoop daily or as needed 255 g 11  . propranolol (INDERAL) 10 MG tablet Take 1-2  tablets (10-20 mg total) by mouth 2 (two) times daily. Takes 2 tablets (20mg ) in the mornings and 1 tablet (10mg ) at bedtime 90 tablet 3  . simvastatin (ZOCOR) 20 MG tablet Take  20 mg by mouth at bedtime.    . sitaGLIPtin-metformin (JANUMET) 50-1000 MG tablet Take 1 tablet by mouth 2 (two) times daily with a meal.    . solifenacin (VESICARE) 10 MG tablet Take 1 tablet (10 mg total) by mouth daily. 30 tablet 11  . sulfamethoxazole-trimethoprim (BACTRIM DS,SEPTRA DS) 800-160 MG tablet Take 1 tablet by mouth 2 (two) times daily. Take 1 bid 14 tablet 0  . vitamin B-12 (CYANOCOBALAMIN) 1000 MCG tablet Take 1,000 mcg by mouth daily.    Marland Kitchen zolpidem (AMBIEN) 5 MG tablet 2.5- 5 mg at night as needed for sleep 90 tablet 0   No current facility-administered medications for this visit.      Musculoskeletal: Strength & Muscle Tone: within normal limits Gait & Station: normal Patient leans: N/A  Psychiatric Specialty Exam: Review of Systems  Psychiatric/Behavioral: Negative for depression, hallucinations, memory loss, substance abuse and suicidal ideas. The patient is nervous/anxious and has insomnia.   All other systems reviewed and are negative.   Blood pressure (!) 148/90, pulse 74, height 5\' 3"  (1.6 m), weight 238 lb (108 kg), SpO2 97 %.Body mass index is 42.16 kg/m.  General Appearance: Fairly Groomed  Eye Contact:  Good  Speech:  Clear and Coherent  Volume:  Normal  Mood:  "fine"  Affect:  Appropriate, Congruent and reactive, but down at times  Thought Process:  Coherent  Orientation:  Full (Time, Place, and Person)  Thought Content: Logical   Suicidal Thoughts:  No  Homicidal Thoughts:  No  Memory:  Immediate;   Good  Judgement:  Good  Insight:  Fair  Psychomotor Activity:  Normal  Concentration:  Concentration: Good and Attention Span: Good  Recall:  Good  Fund of Knowledge: Good  Language: Good  Akathisia:  No  Handed:  Right  AIMS (if indicated): not done  Assets:   Communication Skills Desire for Improvement  ADL's:  Intact  Cognition: WNL  Sleep:  Fair   Screenings: PHQ2-9     Office Visit from 04/17/2018 in [facility] Office Visit from 02/20/2017 in [facility] Office Visit from 01/04/2017 in [facility] Office Visit from 10/05/2016 in [facility] Office Visit from 01/28/2016 in [facility]  PHQ-2 Total Score  6  2  0  0  0  PHQ-9 Total Score  16  9  -  -  -       Assessment and Plan:  JAMINE WINGATE is a 63 y.o. year old female with a history of depression, anxiety,  diabetes, hypertension,blepharospasm,GERD, who presents for follow up appointment for MDD (major depressive disorder), recurrent episode, mild ([place])  # MDD, mild, recurrent without psychotic features There has been steady improvement in depressive symptoms since up titration of duloxetine.  Psychosocial stressors including her husband with kidney cancer.  Will continue duloxetine to target depression.  Discussed potential adverse side effect of hypertension.  Discussed behavioral activation.   # Insomnia There has been improvement in insomnia.  Discussed sleep hygiene.  She is willing to taper off Ambien at this time.   Plan I have reviewed and updated plans as below 1.Continueduloxetine 90 mg daily (60 mg + 30 mg)  2.Hold Ambien 3.Return to clinic inthree months for 15 mins -TSH wnl per patient, Dec 2018 - She sees a therapist  The patient demonstrates the following risk factors for suicide: Chronic risk factors for suicide include:psychiatric disorder ofdepression. Acute risk factorsfor suicide include: family or marital conflict and  unemployment. Protective factorsfor this patient include: coping skills and hope for the future. Considering these factors, the overall suicide risk at this point appears to below. Patientisappropriate for outpatient follow up. Norman Clay,  MD 09/30/2018, 10:30 AM

## 2018-09-30 ENCOUNTER — Ambulatory Visit (INDEPENDENT_AMBULATORY_CARE_PROVIDER_SITE_OTHER): Payer: Medicare Other | Admitting: Psychiatry

## 2018-09-30 ENCOUNTER — Encounter (HOSPITAL_COMMUNITY): Payer: Self-pay | Admitting: Psychiatry

## 2018-09-30 VITALS — BP 148/90 | HR 74 | Ht 63.0 in | Wt 238.0 lb

## 2018-09-30 DIAGNOSIS — F330 Major depressive disorder, recurrent, mild: Secondary | ICD-10-CM | POA: Diagnosis not present

## 2018-09-30 MED ORDER — DULOXETINE HCL 60 MG PO CPEP: ORAL_CAPSULE | ORAL | 0 refills | Status: DC

## 2018-09-30 MED ORDER — DULOXETINE HCL 30 MG PO CPEP: ORAL_CAPSULE | ORAL | 0 refills | Status: DC

## 2018-09-30 NOTE — Patient Instructions (Signed)
1.Continueduloxetine 90 mg daily (60 mg + 30 mg)- only refilled 30 mg as she has enough 60 mg per report 2.Hold Ambien 3.Return to clinic inthree months for 15 mins

## 2018-10-10 DIAGNOSIS — E119 Type 2 diabetes mellitus without complications: Secondary | ICD-10-CM | POA: Diagnosis not present

## 2018-12-18 NOTE — Progress Notes (Deleted)
BH MD/PA/NP OP Progress Note  12/18/2018 3:48 PM Aimee Reed  MRN:  [id number]  Chief Complaint:  HPI: *** Visit Diagnosis: No diagnosis found.  Past Psychiatric History: Please see initial evaluation for full details. I have reviewed the history. No updates at this time.     Past Medical History:  Past Medical History:  Diagnosis Date  . Anxiety   . Blepharospasm   . Depression   . Diabetes mellitus, type II ([place])   . GERD (gastroesophageal reflux disease)   . HTN (hypertension)   . Hyperlipidemia 03/11/2012  . Irritable bowel syndrome   . Overactive bladder   . Recurrent UTI    chronic macrodantin    Past Surgical History:  Procedure Laterality Date  . CARPAL TUNNEL RELEASE     both hands  . CHOLECYSTECTOMY    . COLONOSCOPY     Dr. Arnoldo Morale, around 2010  . COLONOSCOPY N/A 02/25/2013   FVC:BSWHQPR polyps, next tcs 02/2018 give tubular adenomas  . ESOPHAGOGASTRODUODENOSCOPY  07/31/2011   FFM:BWGYKZ cervical esophageal web s/p dilation,query short segment Barrett's esophagus s/p bx (Barrett's esophagus without dysplasia),small hiatal hernia. 65 French Maloney dilator.  . ESOPHAGOGASTRODUODENOSCOPY (EGD) WITH ESOPHAGEAL DILATION N/A 02/25/2013   LDJ:TTSVXBLT distal esophagus/HH. next EGD 02/2016 given prior h/o Barrett's  . EYE SURGERY  November 2016   Eyelids, nerve issues    Family Psychiatric History: Please see initial evaluation for full details. I have reviewed the history. No updates at this time.     Family History:  Family History  Problem Relation Age of Onset  . Depression Father   . Ulcers Mother   . Alzheimer's disease Mother   . Dementia Mother   . Alzheimer's disease Maternal Grandmother   . Dementia Maternal Grandmother   . Diabetes Paternal Grandfather   . Other Paternal Grandmother        died during childbirth  . Other Brother        back problems  . Cancer Sister        lung  . Dementia Brother   . Alzheimer's disease Brother   . Colon  cancer Neg Hx   . Liver disease Neg Hx   . ADD / ADHD Neg Hx   . Alcohol abuse Neg Hx   . Drug abuse Neg Hx   . Anxiety disorder Neg Hx   . OCD Neg Hx   . Bipolar disorder Neg Hx   . Paranoid behavior Neg Hx   . Schizophrenia Neg Hx   . Seizures Neg Hx   . Sexual abuse Neg Hx   . Physical abuse Neg Hx     Social History:  Social History   Socioeconomic History  . Marital status: Married    Spouse name: Not on file  . Number of children: 2  . Years of education: Not on file  . Highest education level: Not on file  Occupational History  . Occupation: unemployed    Fish farm manager: UNEMPLOYED  Social Needs  . Financial resource strain: Not on file  . Food insecurity:    Worry: Not on file    Inability: Not on file  . Transportation needs:    Medical: Not on file    Non-medical: Not on file  Tobacco Use  . Smoking status: Never Smoker  . Smokeless tobacco: Never Used  Substance and Sexual Activity  . Alcohol use: No  . Drug use: No  . Sexual activity: Not Currently    Birth control/protection: Post-menopausal  Lifestyle  . Physical activity:    Days per week: Not on file    Minutes per session: Not on file  . Stress: Not on file  Relationships  . Social connections:    Talks on phone: Not on file    Gets together: Not on file    Attends religious service: Not on file    Active member of club or organization: Not on file    Attends meetings of clubs or organizations: Not on file    Relationship status: Not on file  Other Topics Concern  . Not on file  Social History Narrative  . Not on file    Allergies: No Known Allergies  Metabolic Disorder Labs: Lab Results  Component Value Date   HGBA1C 6.6 (H) 12/28/2016   No results found for: PROLACTIN Lab Results  Component Value Date   CHOL 217 (H) 04/30/2017   TRIG 149 04/30/2017   HDL 56 04/30/2017   CHOLHDL 3.9 04/30/2017   LDLCALC 131 (H) 04/30/2017   LDLCALC 76 10/22/2015   No results found for:  TSH  Therapeutic Level Labs: No results found for: LITHIUM No results found for: VALPROATE No components found for:  CBMZ  Current Medications: Current Outpatient Medications  Medication Sig Dispense Refill  . aspirin EC 81 MG tablet Take 81 mg by mouth daily.      . Calcium-Magnesium-Vitamin D (CALCIUM 1200+D3) 600-40-500 MG-MG-UNIT TB24 Take by mouth daily.    . celecoxib (CELEBREX) 200 MG capsule Take 200 mg by mouth daily as needed.    . DULoxetine (CYMBALTA) 30 MG capsule 90 mg daily (60 mg + 30 mg) 90 capsule 0  . DULoxetine (CYMBALTA) 60 MG capsule Total of 90 mg daily (60+ 30 mg) 90 capsule 0  . JARDIANCE 25 MG TABS tablet TAKE 1 TABLET BY MOUTH  DAILY 90 tablet 0  . loratadine (CLARITIN) 10 MG tablet Take 10 mg by mouth daily.    Marland Kitchen losartan (COZAAR) 100 MG tablet Take 100 mg by mouth daily.    . Multiple Vitamins-Minerals (HAIR SKIN NAILS PO) Take by mouth daily.    Marland Kitchen omeprazole (PRILOSEC) 40 MG capsule Take 40 mg by mouth 2 (two) times daily.    . OnabotulinumtoxinA (BOTOX IJ) Inject as directed. Every 4 months    . polyethylene glycol powder (GLYCOLAX/MIRALAX) powder 1 scoop daily or as needed 255 g 11  . propranolol (INDERAL) 10 MG tablet Take 1-2 tablets (10-20 mg total) by mouth 2 (two) times daily. Takes 2 tablets (20mg ) in the mornings and 1 tablet (10mg ) at bedtime 90 tablet 3  . simvastatin (ZOCOR) 20 MG tablet Take 20 mg by mouth at bedtime.    . sitaGLIPtin-metformin (JANUMET) 50-1000 MG tablet Take 1 tablet by mouth 2 (two) times daily with a meal.    . solifenacin (VESICARE) 10 MG tablet Take 1 tablet (10 mg total) by mouth daily. 30 tablet 11  . sulfamethoxazole-trimethoprim (BACTRIM DS,SEPTRA DS) 800-160 MG tablet Take 1 tablet by mouth 2 (two) times daily. Take 1 bid 14 tablet 0  . vitamin B-12 (CYANOCOBALAMIN) 1000 MCG tablet Take 1,000 mcg by mouth daily.    Marland Kitchen zolpidem (AMBIEN) 5 MG tablet 2.5- 5 mg at night as needed for sleep 90 tablet 0   No current  facility-administered medications for this visit.      Musculoskeletal: Strength & Muscle Tone: N/A Gait & Station: N/A Patient leans: N/A  Psychiatric Specialty Exam: ROS  There were no vitals taken  for this visit.There is no height or weight on file to calculate BMI.  General Appearance: NA  Eye Contact:  NA  Speech:  Clear and Coherent  Volume:  Normal  Mood:  {BHH MOOD:22306}  Affect:  {Affect (PAA):22687}  Thought Process:  Coherent  Orientation:  Full (Time, Place, and Person)  Thought Content: Logical   Suicidal Thoughts:  {ST/HT (PAA):22692}  Homicidal Thoughts:  {ST/HT (PAA):22692}  Memory:  Immediate;   Good  Judgement:  {Judgement (PAA):22694}  Insight:  {Insight (PAA):22695}  Psychomotor Activity:  Normal  Concentration:  Concentration: Good and Attention Span: Good  Recall:  Good  Fund of Knowledge: Good  Language: Good  Akathisia:  No  Handed:  Right  AIMS (if indicated): not done  Assets:  Communication Skills Desire for Improvement  ADL's:  Intact  Cognition: WNL  Sleep:  {BHH GOOD/FAIR/POOR:22877}   Screenings: PHQ2-9     Office Visit from 04/17/2018 in [place] Office Visit from 02/20/2017 in [facility] Office Visit from 01/04/2017 in [facility] Office Visit from 10/05/2016 in [facility] Office Visit from 01/28/2016 in [facility]  PHQ-2 Total Score  6  2  0  0  0  PHQ-9 Total Score  16  9  -  -  -       Assessment and Plan:  Aimee Reed is a 63 y.o. year old female with a history of depression, anxiety, diabetes, hypertension,blepharospasm,GERD , who presents for follow up appointment for No diagnosis found.  # MDD, mild, recurrent without psychotic features   There has been steady improvement in depressive symptoms since up titration of duloxetine.  Psychosocial stressors including her husband with kidney cancer.  Will continue duloxetine to target  depression.  Discussed potential adverse side effect of hypertension.  Discussed behavioral activation.   # Insomnia There has been improvement in insomnia.  Discussed sleep hygiene.  She is willing to taper off Ambien at this time.   Plan  1.Continueduloxetine 90 mg daily (60 mg + 30 mg)  2.Hold Ambien 3.Return to clinic inthree months for 15 mins -TSH wnl per patient, Dec 2018 - She sees a therapist  The patient demonstrates the following risk factors for suicide: Chronic risk factors for suicide include:psychiatric disorder ofdepression. Acute risk factorsfor suicide include: family or marital conflict and unemployment. Protective factorsfor this patient include: coping skills and hope for the future. Considering these factors, the overall suicide risk at this point appears to below. Patientisappropriate for outpatient follow up  Norman Clay, MD 12/18/2018, 3:48 PM

## 2018-12-24 DIAGNOSIS — G245 Blepharospasm: Secondary | ICD-10-CM | POA: Diagnosis not present

## 2018-12-24 DIAGNOSIS — I10 Essential (primary) hypertension: Secondary | ICD-10-CM | POA: Diagnosis not present

## 2018-12-24 DIAGNOSIS — E119 Type 2 diabetes mellitus without complications: Secondary | ICD-10-CM | POA: Diagnosis not present

## 2018-12-24 DIAGNOSIS — Z0001 Encounter for general adult medical examination with abnormal findings: Secondary | ICD-10-CM | POA: Diagnosis not present

## 2018-12-24 DIAGNOSIS — Z1389 Encounter for screening for other disorder: Secondary | ICD-10-CM | POA: Diagnosis not present

## 2018-12-30 ENCOUNTER — Ambulatory Visit (HOSPITAL_COMMUNITY): Payer: Medicare Other | Admitting: Psychiatry

## 2019-03-05 DIAGNOSIS — G245 Blepharospasm: Secondary | ICD-10-CM | POA: Diagnosis not present

## 2019-03-28 DIAGNOSIS — E7849 Other hyperlipidemia: Secondary | ICD-10-CM | POA: Diagnosis not present

## 2019-03-28 DIAGNOSIS — E1165 Type 2 diabetes mellitus with hyperglycemia: Secondary | ICD-10-CM | POA: Diagnosis not present

## 2019-03-28 DIAGNOSIS — I10 Essential (primary) hypertension: Secondary | ICD-10-CM | POA: Diagnosis not present

## 2019-03-28 DIAGNOSIS — G245 Blepharospasm: Secondary | ICD-10-CM | POA: Diagnosis not present

## 2019-03-28 DIAGNOSIS — Z1389 Encounter for screening for other disorder: Secondary | ICD-10-CM | POA: Diagnosis not present

## 2019-04-02 ENCOUNTER — Other Ambulatory Visit (HOSPITAL_COMMUNITY): Payer: Self-pay | Admitting: Family Medicine

## 2019-04-02 DIAGNOSIS — Z1231 Encounter for screening mammogram for malignant neoplasm of breast: Secondary | ICD-10-CM

## 2019-04-24 ENCOUNTER — Other Ambulatory Visit: Payer: Self-pay

## 2019-04-24 NOTE — Patient Outreach (Signed)
[facility]) Care Management  04/24/2019  Aimee Reed 1956/07/27 [id number]   Medication Adherence call to [name] Compliant Voice message left with a call back number. [name] is showing past due on Jardiance 25 mg under [place].   [facility] Management Direct Dial [phone]  Fax [phone] [email or web address]

## 2019-05-15 ENCOUNTER — Other Ambulatory Visit: Payer: Self-pay

## 2019-05-15 NOTE — Patient Outreach (Signed)
[facility]) Care Management  05/15/2019  Aimee Reed 1956/03/22 [id number]   Medication Adherence call to [name] Compliant Voice message left with a call back number. [name] is showing past due on Jardiance 25 mg under [place].   [place] Management Direct Dial [phone]  Fax [phone] [email or web address]

## 2019-05-30 ENCOUNTER — Ambulatory Visit (HOSPITAL_COMMUNITY)
Admission: RE | Admit: 2019-05-30 | Discharge: 2019-05-30 | Disposition: A | Discharge status: Home / Self Care | Payer: Medicare Other | Source: Ambulatory Visit | Attending: Family Medicine | Admitting: Family Medicine

## 2019-05-30 ENCOUNTER — Other Ambulatory Visit: Payer: Self-pay

## 2019-05-30 DIAGNOSIS — Z1231 Encounter for screening mammogram for malignant neoplasm of breast: Secondary | ICD-10-CM | POA: Insufficient documentation

## 2019-06-30 DIAGNOSIS — G4733 Obstructive sleep apnea (adult) (pediatric): Secondary | ICD-10-CM | POA: Diagnosis not present

## 2019-06-30 DIAGNOSIS — E1165 Type 2 diabetes mellitus with hyperglycemia: Secondary | ICD-10-CM | POA: Diagnosis not present

## 2019-06-30 DIAGNOSIS — Z23 Encounter for immunization: Secondary | ICD-10-CM | POA: Diagnosis not present

## 2019-06-30 DIAGNOSIS — I10 Essential (primary) hypertension: Secondary | ICD-10-CM | POA: Diagnosis not present

## 2019-06-30 DIAGNOSIS — G245 Blepharospasm: Secondary | ICD-10-CM | POA: Diagnosis not present

## 2019-07-10 DIAGNOSIS — G245 Blepharospasm: Secondary | ICD-10-CM | POA: Diagnosis not present

## 2019-08-19 DIAGNOSIS — E119 Type 2 diabetes mellitus without complications: Secondary | ICD-10-CM | POA: Diagnosis not present

## 2019-08-19 DIAGNOSIS — M79645 Pain in left finger(s): Secondary | ICD-10-CM | POA: Diagnosis not present

## 2019-08-19 DIAGNOSIS — M1991 Primary osteoarthritis, unspecified site: Secondary | ICD-10-CM | POA: Diagnosis not present

## 2019-09-11 DIAGNOSIS — I10 Essential (primary) hypertension: Secondary | ICD-10-CM | POA: Diagnosis not present

## 2019-09-11 DIAGNOSIS — E1165 Type 2 diabetes mellitus with hyperglycemia: Secondary | ICD-10-CM | POA: Diagnosis not present

## 2019-09-11 DIAGNOSIS — Z7984 Long term (current) use of oral hypoglycemic drugs: Secondary | ICD-10-CM | POA: Diagnosis not present

## 2019-09-25 DIAGNOSIS — N8111 Cystocele, midline: Secondary | ICD-10-CM | POA: Diagnosis not present

## 2019-09-25 DIAGNOSIS — R350 Frequency of micturition: Secondary | ICD-10-CM | POA: Diagnosis not present

## 2019-09-25 DIAGNOSIS — R3915 Urgency of urination: Secondary | ICD-10-CM | POA: Diagnosis not present

## 2019-09-25 DIAGNOSIS — N816 Rectocele: Secondary | ICD-10-CM | POA: Diagnosis not present

## 2019-10-01 DIAGNOSIS — E7849 Other hyperlipidemia: Secondary | ICD-10-CM | POA: Diagnosis not present

## 2019-10-01 DIAGNOSIS — G245 Blepharospasm: Secondary | ICD-10-CM | POA: Diagnosis not present

## 2019-10-01 DIAGNOSIS — E1165 Type 2 diabetes mellitus with hyperglycemia: Secondary | ICD-10-CM | POA: Diagnosis not present

## 2019-10-01 DIAGNOSIS — Z0001 Encounter for general adult medical examination with abnormal findings: Secondary | ICD-10-CM | POA: Diagnosis not present

## 2019-10-01 DIAGNOSIS — E039 Hypothyroidism, unspecified: Secondary | ICD-10-CM | POA: Diagnosis not present

## 2019-10-01 DIAGNOSIS — Z1389 Encounter for screening for other disorder: Secondary | ICD-10-CM | POA: Diagnosis not present

## 2019-10-01 DIAGNOSIS — G4733 Obstructive sleep apnea (adult) (pediatric): Secondary | ICD-10-CM | POA: Diagnosis not present

## 2019-10-01 DIAGNOSIS — I10 Essential (primary) hypertension: Secondary | ICD-10-CM | POA: Diagnosis not present

## 2019-10-02 DIAGNOSIS — Z0001 Encounter for general adult medical examination with abnormal findings: Secondary | ICD-10-CM | POA: Diagnosis not present

## 2019-10-02 DIAGNOSIS — Z1389 Encounter for screening for other disorder: Secondary | ICD-10-CM | POA: Diagnosis not present

## 2019-10-02 DIAGNOSIS — E1165 Type 2 diabetes mellitus with hyperglycemia: Secondary | ICD-10-CM | POA: Diagnosis not present

## 2019-10-12 DIAGNOSIS — I10 Essential (primary) hypertension: Secondary | ICD-10-CM | POA: Diagnosis not present

## 2019-10-12 DIAGNOSIS — E1165 Type 2 diabetes mellitus with hyperglycemia: Secondary | ICD-10-CM | POA: Diagnosis not present

## 2019-10-12 DIAGNOSIS — E7849 Other hyperlipidemia: Secondary | ICD-10-CM | POA: Diagnosis not present

## 2019-11-09 DIAGNOSIS — I10 Essential (primary) hypertension: Secondary | ICD-10-CM | POA: Diagnosis not present

## 2019-11-09 DIAGNOSIS — E1165 Type 2 diabetes mellitus with hyperglycemia: Secondary | ICD-10-CM | POA: Diagnosis not present

## 2019-11-09 DIAGNOSIS — E7849 Other hyperlipidemia: Secondary | ICD-10-CM | POA: Diagnosis not present

## 2019-11-11 DIAGNOSIS — G245 Blepharospasm: Secondary | ICD-10-CM | POA: Diagnosis not present

## 2019-12-10 DIAGNOSIS — I10 Essential (primary) hypertension: Secondary | ICD-10-CM | POA: Diagnosis not present

## 2019-12-10 DIAGNOSIS — E7849 Other hyperlipidemia: Secondary | ICD-10-CM | POA: Diagnosis not present

## 2019-12-10 DIAGNOSIS — E1165 Type 2 diabetes mellitus with hyperglycemia: Secondary | ICD-10-CM | POA: Diagnosis not present

## 2019-12-31 DIAGNOSIS — M1991 Primary osteoarthritis, unspecified site: Secondary | ICD-10-CM | POA: Diagnosis not present

## 2019-12-31 DIAGNOSIS — E119 Type 2 diabetes mellitus without complications: Secondary | ICD-10-CM | POA: Diagnosis not present

## 2019-12-31 DIAGNOSIS — N3281 Overactive bladder: Secondary | ICD-10-CM | POA: Diagnosis not present

## 2020-02-02 ENCOUNTER — Other Ambulatory Visit (HOSPITAL_COMMUNITY): Payer: Self-pay | Admitting: Family Medicine

## 2020-02-02 DIAGNOSIS — E2839 Other primary ovarian failure: Secondary | ICD-10-CM

## 2020-02-02 DIAGNOSIS — K219 Gastro-esophageal reflux disease without esophagitis: Secondary | ICD-10-CM | POA: Diagnosis not present

## 2020-02-06 ENCOUNTER — Encounter: Payer: Self-pay | Admitting: Pharmacist

## 2020-02-18 ENCOUNTER — Encounter: Payer: Self-pay | Admitting: Internal Medicine

## 2020-03-02 ENCOUNTER — Ambulatory Visit (HOSPITAL_COMMUNITY)
Admission: RE | Admit: 2020-03-02 | Discharge: 2020-03-02 | Disposition: A | Discharge status: Home / Self Care | Payer: Medicare Other | Source: Ambulatory Visit | Attending: Family Medicine | Admitting: Family Medicine

## 2020-03-02 ENCOUNTER — Other Ambulatory Visit: Payer: Self-pay

## 2020-03-02 ENCOUNTER — Other Ambulatory Visit (HOSPITAL_COMMUNITY): Payer: Self-pay | Admitting: Internal Medicine

## 2020-03-02 ENCOUNTER — Ambulatory Visit (HOSPITAL_COMMUNITY)
Admission: RE | Admit: 2020-03-02 | Discharge: 2020-03-02 | Disposition: A | Discharge status: Home / Self Care | Payer: Medicare Other | Source: Ambulatory Visit | Attending: Internal Medicine | Admitting: Internal Medicine

## 2020-03-02 DIAGNOSIS — K219 Gastro-esophageal reflux disease without esophagitis: Secondary | ICD-10-CM | POA: Diagnosis not present

## 2020-03-02 DIAGNOSIS — E2839 Other primary ovarian failure: Secondary | ICD-10-CM

## 2020-03-02 DIAGNOSIS — S299XXA Unspecified injury of thorax, initial encounter: Secondary | ICD-10-CM

## 2020-03-02 DIAGNOSIS — I10 Essential (primary) hypertension: Secondary | ICD-10-CM | POA: Diagnosis not present

## 2020-03-02 DIAGNOSIS — Z13820 Encounter for screening for osteoporosis: Secondary | ICD-10-CM | POA: Diagnosis not present

## 2020-03-02 DIAGNOSIS — Z780 Asymptomatic menopausal state: Secondary | ICD-10-CM | POA: Diagnosis not present

## 2020-03-02 DIAGNOSIS — S2232XA Fracture of one rib, left side, initial encounter for closed fracture: Secondary | ICD-10-CM | POA: Diagnosis not present

## 2020-03-02 DIAGNOSIS — E119 Type 2 diabetes mellitus without complications: Secondary | ICD-10-CM | POA: Diagnosis not present

## 2020-03-16 DIAGNOSIS — G245 Blepharospasm: Secondary | ICD-10-CM | POA: Diagnosis not present

## 2020-03-29 DIAGNOSIS — G4733 Obstructive sleep apnea (adult) (pediatric): Secondary | ICD-10-CM | POA: Diagnosis not present

## 2020-04-13 DIAGNOSIS — I10 Essential (primary) hypertension: Secondary | ICD-10-CM | POA: Diagnosis not present

## 2020-04-13 DIAGNOSIS — E119 Type 2 diabetes mellitus without complications: Secondary | ICD-10-CM | POA: Diagnosis not present

## 2020-04-13 DIAGNOSIS — G4733 Obstructive sleep apnea (adult) (pediatric): Secondary | ICD-10-CM | POA: Diagnosis not present

## 2020-04-29 ENCOUNTER — Ambulatory Visit: Payer: Medicare Other | Admitting: Nurse Practitioner

## 2020-04-29 ENCOUNTER — Other Ambulatory Visit: Payer: Self-pay

## 2020-04-29 ENCOUNTER — Encounter: Payer: Self-pay | Admitting: Nurse Practitioner

## 2020-04-29 VITALS — BP 176/83 | HR 75 | Temp 98.0°F | Ht 63.0 in | Wt 229.8 lb

## 2020-04-29 DIAGNOSIS — K219 Gastro-esophageal reflux disease without esophagitis: Secondary | ICD-10-CM | POA: Diagnosis not present

## 2020-04-29 DIAGNOSIS — K2270 Barrett's esophagus without dysplasia: Secondary | ICD-10-CM

## 2020-04-29 DIAGNOSIS — R1310 Dysphagia, unspecified: Secondary | ICD-10-CM

## 2020-04-29 DIAGNOSIS — R1319 Other dysphagia: Secondary | ICD-10-CM

## 2020-04-29 NOTE — Patient Instructions (Addendum)
Your health issues we discussed today were:   GERD (reflux/heartburn): 1. Stop taking Prilosec 2. I am giving you samples of Dexilant 60 mg.  Take this first thing in the morning on an empty stomach 3. Call us in 1 to 2 weeks and let us know if it is helping your reflux any better 4. Call us for any worsening or severe symptoms  Barrett's esophagus (abnormal cells in the end of your swallowing tube): 1. You have previously been diagnosed with Barrett's esophagus 2. You should have routine upper endoscopies to evaluate for any changes 3. We will proceed with upper endoscopy due to your swallowing problems, but we will likely recommend a repeat endoscopy in 3 years 4. Also, you will always need to be on an acid blocker due to Barrett's esophagus to prevent worsening changes  Dysphagia (swallowing difficulties): 1. Has been discussed we will plan for an upper endoscopy with possible dilation 2. Further recommendations will follow your procedure 3. We will give you temporary medication adjustments for the day prior to and the day of your procedure 4. Call for any worsening or severe symptoms 5. In the meantime, try to eat softer foods, chew adequately, and keep fluids on hand while eating to help food pass 6. Further information is provided below  Overall I recommend:  1. Continue your other current medications 2. Return for follow-up in 3 months at which point we will schedule your colonoscopy 3. Call us if you have any questions or concerns   ---------------------------------------------------------------  I am glad you have gotten your COVID-19 vaccination!  Even though you are fully vaccinated you should continue to follow CDC and state/local guidelines.  ---------------------------------------------------------------   At [facility] we value your feedback. You may receive a survey about your visit today. Please share your experience as we strive to create trusting  relationships with our patients to provide genuine, compassionate, quality care.  We appreciate your understanding and patience as we review any laboratory studies, imaging, and other diagnostic tests that are ordered as we care for you. Our office policy is 5 business days for review of these results, and any emergent or urgent results are addressed in a timely manner for your best interest. If you do not hear from our office in 1 week, please contact us.   We also encourage the use of MyChart, which contains your medical information for your review as well. If you are not enrolled in this feature, an access code is on this after visit summary for your convenience. Thank you for allowing [place] to be involved in your care.  It was great to see you today!  I hope you have a great rest of your summer!!      Dysphagia Eating Plan, Bite Size Food This diet plan is for people with moderate swallowing problems who have transitioned from pureed and minced foods. Bite size foods are soft and cut into small chunks so that they can be swallowed safely. On this eating plan, you may be instructed to drink liquids that are thickened. Work with your health care provider and your diet and nutrition specialist (dietitian) to make sure that you are following the diet safely and getting all the nutrients you need. What are tips for following this plan? General guidelines for foods   You may eat foods that are tender, soft, and moist.  Always test food texture before taking a bite. Poke food with a fork or spoon to make sure it is tender.  Food should be easy to cut and shew. Avoid large pieces of food that require a lot of chewing.  Take small bites. Each bite should be smaller than your thumb nail (about 19mm by 15 mm).  If you were on pureed and minced food diet plans, you may eat any of the foods included in those diets.  Avoid foods that are very dry, hard, sticky, chewy, coarse, or crunchy.  If  instructed by your health care provider, thicken liquids. Follow your health care provider's instructions for what products to use, how to do this, and to what thickness. ? Your health care provider may recommend using a commercial thickener, rice cereal, or potato flakes. Ask your health care provider to recommend thickeners. ? Thickened liquids are usually a "pudding-like" consistency, or they may be as thick as honey or thick enough to eat with a spoon. Cooking  To moisten foods, you may add liquids while you are blending, mashing, or grinding your foods to the right consistency. These liquids include gravies, sauces, vegetable or fruit juice, milk, half and half, or water.  Strain extra liquid from foods before eating.  Reheat foods slowly to prevent a tough crust from forming.  Prepare foods in advance. Meal planning  Eat a variety of foods to get all the nutrients you need.  Some foods may be tolerated better than others. Work with your dietitian to identify which foods are safest for you to eat.  Follow your meal plan as told by your dietitian. What foods are allowed? Grains Moist breads without nuts or seeds. Biscuits, muffins, pancakes, and waffles that are well-moistened with syrup, jelly, margarine, or butter. Cooked cereals. Moist bread stuffing. Moist rice. Well-moistened cold cereal with small chunks. Well-cooked pasta, noodles, rice, and bread dressing in small pieces and thick sauce. Soft dumplings or spaetzle in small pieces and butter or gravy. Vegetables Soft, well-cooked vegetables in small pieces. Soft-cooked, mashed potatoes. Thickened vegetable juice. Fruits Canned or cooked fruits that are soft or moist and do not have skin or seeds. Fresh, soft bananas. Thickened fruit juices. Meat and other protein foods Tender, moist meats or poultry in small pieces. Moist meatballs or meatloaf. Fish without bones. Eggs or egg substitutes in small pieces. Tofu. Tempeh and meat  alternatives in small pieces. Well-cooked, tender beans, peas, baked beans, and other legumes. Dairy Thickened milk. Cream cheese. Yogurt. Cottage cheese. Sour cream. Small pieces of soft cheese. Fats and oils Butter. Oils. Margarine. Mayonnaise. Gravy. Spreads. Sweets and desserts Soft, smooth, moist desserts. Pudding. Custard. Moist cakes. Jam. Jelly. Honey. Preserves. Ask your health care provider whether you can have frozen desserts. Seasoning and other foods All seasonings and sweeteners. All sauces with small chunks. Prepared tuna, egg, or chicken salad without raw fruits or vegetables. Moist casseroles with small, tender pieces of meat. Soups with tender meat. What foods are not allowed? Grains Coarse or dry cereals. Dry breads. Toast. Crackers. Tough, crusty breads, such as [place] bread and baguettes. Dry pancakes, waffles, and muffins. Sticky rice. Dry bread stuffing. Granola. Popcorn. Chips. Vegetables All raw vegetables. Cooked corn. Rubbery or stiff cooked vegetables. Stringy vegetables, such as celery. Tough, crisp fried potatoes. Potato skins. Fruits Hard, crunchy, stringy, high-pulp, and juicy raw fruits such as apples, pineapple, papaya, and watermelon. Small, round fruits, such as grapes. Dried fruit and fruit leather. Meat and other protein foods Large pieces of meat. Dry, tough meats, such as bacon, sausage, and hot dogs. Chicken, [place], or fish with skin and bones.  Crunchy peanut butter. Nuts. Seeds. Nut and seed butters. Dairy Yogurt with nuts, seeds, or large chunks. Large chunks of cheese. Frozen desserts and milk consistency not allowed by your dietitian. Sweets and desserts Dry cakes. Chewy or dry cookies. Any desserts with nuts, seeds, dry fruits, coconut, pineapple, or anything dry, sticky, or hard. Chewy caramel. Licorice. Taffy-type candies. Ask your health care provider whether you can have frozen desserts. Seasoning and other foods Soups with tough or large  chunks of meats, poultry, or vegetables. Corn or clam chowder. Smoothies with large chunks of fruit. Summary  Bite size foods can be helpful for people with moderate swallowing problems.  On the dysphagia eating plan, you may eat foods that are soft, moist, and cut into pieces smaller than 54mm by 68mm.  You may be instructed to thicken liquids. Follow your health care provider's instructions about how to do this and to what consistency. This information is not intended to replace advice given to you by your health care provider. Make sure you discuss any questions you have with your health care provider. Document Revised: 12/19/2018 Document Reviewed: 12/08/2016 Elsevier Patient Education  [place].

## 2020-04-29 NOTE — Progress Notes (Addendum)
Primary Care Physician:  Sharilyn Sites, MD Primary Gastroenterologist:  Dr. Gala Romney  Chief Complaint  Patient presents with  . Hiatal Hernia    Gets choked on food and water    HPI:   Aimee Reed is a 64 y.o. female who presents on referral from primary care for dysphagia.  Reviewed information provided with referral including office visit dated 02/02/2020.  At that time the patient was complaining of GERD symptoms in the epigastric area as well as sour taste.  Has previously attempted PPI.  Recommended changing PPI to Dexilant, referral to GI for evaluation of dysphagia.  She was last seen in our office 08/02/2015 for elevated LFTs and GERD.  Minimal transaminitis with AST/ALT at 89/90 at that time, noted chronic GERD on omeprazole 40 mg twice a day with breakthrough.  No solid food dysphagia at that time.  Overall felt transaminitis likely due to fatty liver and recommended laboratory work-up and abdominal ultrasound.  Labs completed 08/02/2015 found persistent but stable transaminitis, suspect NASH.  Viral hepatitis screening and autoimmune hepatitis work-up all negative.  Recommended hepatitis A/B vaccination.  Ultrasound showed fatty liver with no obvious cirrhosis.  Recommended recheck labs in 3 months, elastography in 6 months, follow-up in the office in 3 months.  Follow-up office visit and elastography did not occur.  Last EGD completed 02/25/2013 which found abnormal distal esophagus consistent with prior diagnosis of Barrett's, status post biopsy.  Status post Maloney dilation in the esophagus.  Noted hiatal hernia.  Recommended continue PPI twice daily.  Surgical pathology found the biopsies to be consistent with GERD.  Recommended repeat EGD in 3 years (2017).  Colonoscopy at the same time found three colon polyps, the largest of which was 8 mm.  All polyps pedunculated.  Anal canal hemorrhoids and papilla noted.  Otherwise normal.  Surgical pathology found the polyps to be  tubular adenoma.  Recommended repeat colonoscopy in 5 years (2019).  The patient was lost to follow-up and subsequent follow-up endoscopic evaluation has not since recurred.  Today she states she is doing okay overall. States she's unaware of PCP change to her PPI. She is still on Prilosec bid. Uses apple cider vinegar for worsening GERD symptoms. Has breakthrough symptoms about 2 times a week. Began having swallowing problems again about 6 months ago; previous dilation helped for a number of years. Having solid food dysphagia as well as dysphagia with pills, ice, water. Worst is with dry foods. Occurs about 3-4 times a week, diet dependent. She also doesn't remember history of Barrett's esophagus and recommended repeat in 2017. Knows her colonoscopy is due. Denies abdominal pain, N/V, hematochezia, melena, fever, chills, unintentional weight loss. Denies URI or flu-like symptoms. Denies loss of sense of taste or smell. The patient has received COVID-19 vaccination(s). Denies chest pain, dyspnea, dizziness, lightheadedness, syncope, near syncope. Denies any other upper or lower GI symptoms.  Past Medical History:  Diagnosis Date  . Anxiety   . Blepharospasm   . Depression   . Diabetes mellitus, type II ([place])   . GERD (gastroesophageal reflux disease)   . HTN (hypertension)   . Hyperlipidemia 03/11/2012  . Irritable bowel syndrome   . Overactive bladder   . Recurrent UTI    chronic macrodantin    Past Surgical History:  Procedure Laterality Date  . CARPAL TUNNEL RELEASE     both hands  . CHOLECYSTECTOMY    . COLONOSCOPY     Dr. Arnoldo Morale, around 2010  . COLONOSCOPY  N/A 02/25/2013   XMI:WOEHOZY polyps, next tcs 02/2018 give tubular adenomas  . ESOPHAGOGASTRODUODENOSCOPY  07/31/2011   YQM:GNOIBB cervical esophageal web s/p dilation,query short segment Barrett's esophagus s/p bx (Barrett's esophagus without dysplasia),small hiatal hernia. 40 French Maloney dilator.  . ESOPHAGOGASTRODUODENOSCOPY  (EGD) WITH ESOPHAGEAL DILATION N/A 02/25/2013   CWU:[id number] distal esophagus/HH. next EGD 02/2016 given prior h/o Barrett's  . EYE SURGERY  November 2016   Eyelids, nerve issues    Current Outpatient Medications  Medication Sig Dispense Refill  . Apple Cider Vinegar 500 MG TABS Take by mouth in the morning and at bedtime.    Marland Kitchen aspirin EC 81 MG tablet Take 81 mg by mouth daily.      . Calcium-Magnesium-Vitamin D (CALCIUM 1200+D3) 600-40-500 MG-MG-UNIT TB24 Take by mouth daily.    . Cinnamon 500 MG capsule Take 500 mg by mouth in the morning and at bedtime. Takes 2000 mg daily.    . DULoxetine (CYMBALTA) 30 MG capsule 90 mg daily (60 mg + 30 mg) (Patient taking differently: 60 mg. Pt takes 60 mg a day.) 90 capsule 0  . JARDIANCE 25 MG TABS tablet TAKE 1 TABLET BY MOUTH  DAILY 90 tablet 0  . loratadine (CLARITIN) 10 MG tablet Take 10 mg by mouth daily.    Marland Kitchen losartan (COZAAR) 100 MG tablet Take 100 mg by mouth daily.    . Multiple Vitamins-Minerals (HAIR SKIN NAILS PO) Take by mouth daily.    . niacin 500 MG tablet Take 500 mg by mouth daily.    Marland Kitchen omeprazole (PRILOSEC) 40 MG capsule Take 40 mg by mouth 2 (two) times daily.    . OnabotulinumtoxinA (BOTOX IJ) Inject as directed. Every 4 months    . oxybutynin (DITROPAN) 5 MG tablet Take 5 mg by mouth in the morning and at bedtime. Takes 1 tablet in the morning and 1 tablet at night.    . propranolol (INDERAL) 10 MG tablet Take 1-2 tablets (10-20 mg total) by mouth 2 (two) times daily. Takes 2 tablets (62m) in the mornings and 1 tablet (149m at bedtime 90 tablet 3  . sitaGLIPtin-metformin (JANUMET) 50-1000 MG tablet Take 1 tablet by mouth 2 (two) times daily with a meal.    . vitamin B-12 (CYANOCOBALAMIN) 1000 MCG tablet Take 1,000 mcg by mouth daily.    . Marland Kitchenolpidem (AMBIEN) 5 MG tablet 2.5- 5 mg at night as needed for sleep (Patient taking differently: as needed. 2.5- 5 mg at night as needed for sleep) 90 tablet 0   No current  facility-administered medications for this visit.    Allergies as of 04/29/2020  . (No Known Allergies)    Family History  Problem Relation Age of Onset  . Depression Father   . Ulcers Mother   . Alzheimer's disease Mother   . Dementia Mother   . Alzheimer's disease Maternal Grandmother   . Dementia Maternal Grandmother   . Diabetes Paternal Grandfather   . Other Paternal Grandmother        died during childbirth  . Other Brother        back problems  . Cancer Sister        lung  . Dementia Brother   . Alzheimer's disease Brother   . Colon cancer Neg Hx   . Liver disease Neg Hx   . ADD / ADHD Neg Hx   . Alcohol abuse Neg Hx   . Drug abuse Neg Hx   . Anxiety disorder Neg Hx   .  OCD Neg Hx   . Bipolar disorder Neg Hx   . Paranoid behavior Neg Hx   . Schizophrenia Neg Hx   . Seizures Neg Hx   . Sexual abuse Neg Hx   . Physical abuse Neg Hx   . Gastric cancer Neg Hx   . Esophageal cancer Neg Hx     Social History   Socioeconomic History  . Marital status: Married    Spouse name: Not on file  . Number of children: 2  . Years of education: Not on file  . Highest education level: Not on file  Occupational History  . Occupation: unemployed    Fish farm manager: UNEMPLOYED  Tobacco Use  . Smoking status: Never Smoker  . Smokeless tobacco: Never Used  Vaping Use  . Vaping Use: Never used  Substance and Sexual Activity  . Alcohol use: No  . Drug use: No  . Sexual activity: Not Currently    Birth control/protection: Post-menopausal  Other Topics Concern  . Not on file  Social History Narrative  . Not on file   Social Determinants of Health   Financial Resource Strain:   . Difficulty of Paying Living Expenses: Not on file  Food Insecurity:   . Worried About Charity fundraiser in the Last Year: Not on file  . Ran Out of Food in the Last Year: Not on file  Transportation Needs:   . Lack of Transportation (Medical): Not on file  . Lack of Transportation  (Non-Medical): Not on file  Physical Activity:   . Days of Exercise per Week: Not on file  . Minutes of Exercise per Session: Not on file  Stress:   . Feeling of Stress : Not on file  Social Connections:   . Frequency of Communication with Friends and Family: Not on file  . Frequency of Social Gatherings with Friends and Family: Not on file  . Attends Religious Services: Not on file  . Active Member of Clubs or Organizations: Not on file  . Attends Archivist Meetings: Not on file  . Marital Status: Not on file  Intimate Partner Violence:   . Fear of Current or Ex-Partner: Not on file  . Emotionally Abused: Not on file  . Physically Abused: Not on file  . Sexually Abused: Not on file    Subjective: Review of Systems  Constitutional: Negative for chills, fever, malaise/fatigue and weight loss.  HENT: Negative for congestion and sore throat.   Respiratory: Negative for cough and shortness of breath.   Cardiovascular: Negative for chest pain and palpitations.  Gastrointestinal: Negative for abdominal pain, blood in stool, diarrhea, melena, nausea and vomiting.  Musculoskeletal: Negative for joint pain and myalgias.  Skin: Negative for rash.  Neurological: Negative for dizziness and weakness.  Endo/Heme/Allergies: Does not bruise/bleed easily.  Psychiatric/Behavioral: Negative for depression. The patient is not nervous/anxious.   All other systems reviewed and are negative.      Objective: BP (!) 176/83   Pulse 75   Temp 98 F (36.7 C) (Oral)   Ht _0  (1.6 m)   Wt 229 lb 12.8 oz (104.2 kg)   BMI 40.71 kg/m  Physical Exam Vitals and nursing note reviewed.  Constitutional:      General: She is not in acute distress.    Appearance: Normal appearance. She is well-developed. She is obese. She is not ill-appearing, toxic-appearing or diaphoretic.  HENT:     Head: Normocephalic and atraumatic.     Nose: No congestion  or rhinorrhea.  Eyes:     General: No  scleral icterus. Cardiovascular:     Rate and Rhythm: Normal rate and regular rhythm.     Heart sounds: Normal heart sounds.  Pulmonary:     Effort: Pulmonary effort is normal. No respiratory distress.     Breath sounds: Normal breath sounds.  Abdominal:     General: Bowel sounds are normal.     Palpations: Abdomen is soft. There is no hepatomegaly, splenomegaly or mass.     Tenderness: There is no abdominal tenderness. There is no guarding or rebound.     Hernia: No hernia is present.  Skin:    General: Skin is warm and dry.     Coloration: Skin is not jaundiced.     Findings: No rash.  Neurological:     General: No focal deficit present.     Mental Status: She is alert and oriented to person, place, and time.  Psychiatric:        Attention and Perception: Attention normal.        Mood and Affect: Mood normal.        Speech: Speech normal.        Behavior: Behavior normal.        Thought Content: Thought content normal.        Cognition and Memory: Cognition and memory normal.      Assessment:  Very pleasant 64 year old female with a chronic history of GERD, currently exacerbated despite Prilosec twice daily.  It appears her primary care intended to switch her to [place] but for some reason the samples or prescription of her out of the pharmacy.  She is still having breakthrough symptoms at least twice a week.  I will have her stop Prilosec and start her on Dexilant.  She has recurrent esophageal dysphagia with solid foods, pills, and even sometimes liquids.  Previous dilation helped.  She does have a history of Barrett's esophagus and was due to repeat her EGD in 2017, which did not occur.  She seems surprised about Barrett's esophagus states she has never been told this.  At this point we will proceed with upper endoscopy for Barrett's as well as dysphagia, we will plan for possible dilation.  Medication adjustments to be made.  Follow-up in 3 months.   Plan: 1. EGD with  possible dilation on propofol 2. We will adjust her Jardiance and Janumet prior to her procedure to prevent hypoglycemia 3. I will have her stop Prilosec, provide samples for Dexilant 60 mg daily with request for progress report in 1 to 2 weeks 4. Follow-up in 3 months to reevaluate dysphagia and to schedule a colonoscopy which is currently due   Proceed with EGD on propofol/MAC with Dr. Gala Romney in near future: the risks, benefits, and alternatives have been discussed with the patient in detail. The patient states understanding and desires to proceed.  The patient is currently on Ambien, Cymbalta. The patient is not on any other anticoagulants, anxiolytics, chronic pain medications, antidepressants, antidiabetics, or iron supplements.  We will plan for the procedure on propofol/MAC to promote adequate sedation.  ASA III   Thank you for allowing [place] to participate in the care of Berton Mount, DNP, AGNP-C Adult & Gerontological Nurse Practitioner [facility]    04/29/2020 10:56 AM   Disclaimer: This note was dictated with voice recognition software. Similar sounding words can inadvertently be transcribed and may not be corrected upon review.

## 2020-04-29 NOTE — Patient Instructions (Signed)
PA for EGD/DIL submitted via [facility] website. Case approved. PA# [id number], valid 07/05/20-09/10/20.

## 2020-05-14 DIAGNOSIS — G4733 Obstructive sleep apnea (adult) (pediatric): Secondary | ICD-10-CM | POA: Diagnosis not present

## 2020-06-13 DIAGNOSIS — G4733 Obstructive sleep apnea (adult) (pediatric): Secondary | ICD-10-CM | POA: Diagnosis not present

## 2020-06-29 ENCOUNTER — Telehealth: Payer: Self-pay | Admitting: Internal Medicine

## 2020-06-29 NOTE — Telephone Encounter (Signed)
Called pt. Aware I don't have  Dr. Roseanne Kaufman schedule for jan. Advised will call back to r/s

## 2020-06-29 NOTE — Telephone Encounter (Signed)
Pt wants to reschedule her EGD with Dr Gala Romney. She is scheduled for 07/05/2020.  [phone]

## 2020-06-30 ENCOUNTER — Other Ambulatory Visit (HOSPITAL_COMMUNITY): Payer: Self-pay | Admitting: Family Medicine

## 2020-06-30 DIAGNOSIS — Z1231 Encounter for screening mammogram for malignant neoplasm of breast: Secondary | ICD-10-CM

## 2020-07-01 NOTE — Telephone Encounter (Signed)
Called pt. She has been scheduled for 2/7 at 7:30am. Aware will mail new prep instructions with pre-op/covid test appt. Called endo and lmovm

## 2020-07-01 NOTE — Telephone Encounter (Signed)
Josue Hector  Cathye Kreiter S, CMA My PAT schedule does not go out that far. I will make a note to schedule it at a later date. I may be like a month or so before I can schedule the PAT & covid. I have it on the Endo schedule.   Thanks,  Hoyle Sauer

## 2020-07-02 ENCOUNTER — Encounter (HOSPITAL_COMMUNITY): Payer: Medicare Other

## 2020-07-02 ENCOUNTER — Other Ambulatory Visit (HOSPITAL_COMMUNITY): Payer: Medicare Other

## 2020-07-14 DIAGNOSIS — G4733 Obstructive sleep apnea (adult) (pediatric): Secondary | ICD-10-CM | POA: Diagnosis not present

## 2020-07-19 ENCOUNTER — Other Ambulatory Visit: Payer: Self-pay

## 2020-07-19 ENCOUNTER — Ambulatory Visit (HOSPITAL_COMMUNITY)
Admission: RE | Admit: 2020-07-19 | Discharge: 2020-07-19 | Disposition: A | Discharge status: Home / Self Care | Payer: Medicare Other | Source: Ambulatory Visit | Attending: Family Medicine | Admitting: Family Medicine

## 2020-07-19 DIAGNOSIS — E7849 Other hyperlipidemia: Secondary | ICD-10-CM | POA: Diagnosis not present

## 2020-07-19 DIAGNOSIS — E538 Deficiency of other specified B group vitamins: Secondary | ICD-10-CM | POA: Diagnosis not present

## 2020-07-19 DIAGNOSIS — Z1231 Encounter for screening mammogram for malignant neoplasm of breast: Secondary | ICD-10-CM | POA: Diagnosis not present

## 2020-07-19 DIAGNOSIS — Z23 Encounter for immunization: Secondary | ICD-10-CM | POA: Diagnosis not present

## 2020-07-19 DIAGNOSIS — E1165 Type 2 diabetes mellitus with hyperglycemia: Secondary | ICD-10-CM | POA: Diagnosis not present

## 2020-07-19 DIAGNOSIS — L659 Nonscarring hair loss, unspecified: Secondary | ICD-10-CM | POA: Diagnosis not present

## 2020-07-20 DIAGNOSIS — G245 Blepharospasm: Secondary | ICD-10-CM | POA: Diagnosis not present

## 2020-07-26 ENCOUNTER — Other Ambulatory Visit (HOSPITAL_COMMUNITY): Payer: Self-pay | Admitting: Family Medicine

## 2020-07-26 DIAGNOSIS — R928 Other abnormal and inconclusive findings on diagnostic imaging of breast: Secondary | ICD-10-CM

## 2020-08-03 ENCOUNTER — Ambulatory Visit: Payer: Medicare Other | Admitting: Nurse Practitioner

## 2020-08-03 ENCOUNTER — Encounter: Payer: Self-pay | Admitting: Internal Medicine

## 2020-08-03 NOTE — Progress Notes (Deleted)
Referring Provider: Sharilyn Sites, MD Primary Care Physician:  Sharilyn Sites, MD Primary GI:  Dr. Gala Romney  No chief complaint on file.   HPI:   Aimee Reed is a 64 y.o. female who presents for follow-up on GERD and dysphagia.  The patient was last seen in our office 04/29/2020 for the same as well as Barrett's esophagus.  Previous GERD symptoms, mild transaminitis in her past with AST/ALT 89/90 likely due to fatty liver.  Liver work-up including viral hepatitis screening and autoimmune hepatitis all negative.  Recommended hepatitis A/B vaccination.  Ultrasound with fatty liver no obvious cirrhosis.  Previous EGD 02/25/2013 consistent with Barrett's esophagus and status post [facility] dilation with recommended PPI twice daily.  Recommended repeat EGD in 2017.  Colonoscopy at the same time with 3 colon polyps, the largest of which was 8 mm and all were pedunculated.  Found to be tubular adenoma recommend repeat colonoscopy in 5 years (2019).  However, the patient was lost to follow-up and endoscopic evaluation did not occur.  At her last visit she noted unaware of PCP change of her PPI, still on Prilosec twice daily and uses apple cider vinegar for breakthrough.  Recurrent dysphagia as of 6 months prior, previously dilation helped for a number of years.  Worse symptoms with dry foods, occurs about 3-4 times a week and diet dependent.  No other overt GI complaints.  Recommended EGD with possible dilation, stop Prilosec and start Dexilant, follow-up in 3 months..  However, for some reason a colonoscopy (also due) was not added to her procedure.  She is currently scheduled for EGD with possible dilation 10/18/2020.   Today she states she is doing okay overall.  Past Medical History:  Diagnosis Date  . Anxiety   . Blepharospasm   . Depression   . Diabetes mellitus, type II ([place])   . GERD (gastroesophageal reflux disease)   . HTN (hypertension)   . Hyperlipidemia 03/11/2012  . Irritable bowel  syndrome   . Overactive bladder   . Recurrent UTI    chronic macrodantin    Past Surgical History:  Procedure Laterality Date  . CARPAL TUNNEL RELEASE     both hands  . CHOLECYSTECTOMY    . COLONOSCOPY     Dr. Arnoldo Morale, around 2010  . COLONOSCOPY N/A 02/25/2013   KGY:JEHUDJS polyps, next tcs 02/2018 give tubular adenomas  . ESOPHAGOGASTRODUODENOSCOPY  07/31/2011   HFW:YOVZCH cervical esophageal web s/p dilation,query short segment Barrett's esophagus s/p bx (Barrett's esophagus without dysplasia),small hiatal hernia. 41 French Maloney dilator.  . ESOPHAGOGASTRODUODENOSCOPY (EGD) WITH ESOPHAGEAL DILATION N/A 02/25/2013   YIF:[id number] distal esophagus/HH. next EGD 02/2016 given prior h/o Barrett's  . EYE SURGERY  November 2016   Eyelids, nerve issues    Current Outpatient Medications  Medication Sig Dispense Refill  . Apple Cider Vinegar 500 MG TABS Take by mouth in the morning and at bedtime.    Marland Kitchen aspirin EC 81 MG tablet Take 81 mg by mouth daily.      . Calcium-Magnesium-Vitamin D (CALCIUM 1200+D3) 600-40-500 MG-MG-UNIT TB24 Take by mouth daily.    . Cinnamon 500 MG capsule Take 500 mg by mouth in the morning and at bedtime. Takes 2000 mg daily.    . DULoxetine (CYMBALTA) 30 MG capsule 90 mg daily (60 mg + 30 mg) (Patient taking differently: 60 mg. Pt takes 60 mg a day.) 90 capsule 0  . JARDIANCE 25 MG TABS tablet TAKE 1 TABLET BY MOUTH  DAILY 90 tablet  0  . loratadine (CLARITIN) 10 MG tablet Take 10 mg by mouth daily.    Marland Kitchen losartan (COZAAR) 100 MG tablet Take 100 mg by mouth daily.    . Multiple Vitamins-Minerals (HAIR SKIN NAILS PO) Take by mouth daily.    . niacin 500 MG tablet Take 500 mg by mouth daily.    Marland Kitchen omeprazole (PRILOSEC) 40 MG capsule Take 40 mg by mouth 2 (two) times daily.    . OnabotulinumtoxinA (BOTOX IJ) Inject as directed. Every 4 months    . oxybutynin (DITROPAN) 5 MG tablet Take 5 mg by mouth in the morning and at bedtime. Takes 1 tablet in the morning and 1  tablet at night.    . propranolol (INDERAL) 10 MG tablet Take 1-2 tablets (10-20 mg total) by mouth 2 (two) times daily. Takes 2 tablets (20mg ) in the mornings and 1 tablet (10mg ) at bedtime 90 tablet 3  . sitaGLIPtin-metformin (JANUMET) 50-1000 MG tablet Take 1 tablet by mouth 2 (two) times daily with a meal.    . vitamin B-12 (CYANOCOBALAMIN) 1000 MCG tablet Take 1,000 mcg by mouth daily.    Marland Kitchen zolpidem (AMBIEN) 5 MG tablet 2.5- 5 mg at night as needed for sleep (Patient taking differently: as needed. 2.5- 5 mg at night as needed for sleep) 90 tablet 0   No current facility-administered medications for this visit.    Allergies as of 08/03/2020  . (No Known Allergies)    Family History  Problem Relation Age of Onset  . Depression Father   . Ulcers Mother   . Alzheimer's disease Mother   . Dementia Mother   . Alzheimer's disease Maternal Grandmother   . Dementia Maternal Grandmother   . Diabetes Paternal Grandfather   . Other Paternal Grandmother        died during childbirth  . Other Brother        back problems  . Cancer Sister        lung  . Dementia Brother   . Alzheimer's disease Brother   . Colon cancer Neg Hx   . Liver disease Neg Hx   . ADD / ADHD Neg Hx   . Alcohol abuse Neg Hx   . Drug abuse Neg Hx   . Anxiety disorder Neg Hx   . OCD Neg Hx   . Bipolar disorder Neg Hx   . Paranoid behavior Neg Hx   . Schizophrenia Neg Hx   . Seizures Neg Hx   . Sexual abuse Neg Hx   . Physical abuse Neg Hx   . Gastric cancer Neg Hx   . Esophageal cancer Neg Hx     Social History   Socioeconomic History  . Marital status: Married    Spouse name: Not on file  . Number of children: 2  . Years of education: Not on file  . Highest education level: Not on file  Occupational History  . Occupation: unemployed    Fish farm manager: UNEMPLOYED  Tobacco Use  . Smoking status: Never Smoker  . Smokeless tobacco: Never Used  Vaping Use  . Vaping Use: Never used  Substance and Sexual  Activity  . Alcohol use: No  . Drug use: No  . Sexual activity: Not Currently    Birth control/protection: Post-menopausal  Other Topics Concern  . Not on file  Social History Narrative  . Not on file   Social Determinants of Health   Financial Resource Strain:   . Difficulty of Paying Living Expenses: Not on  file  Food Insecurity:   . Worried About Charity fundraiser in the Last Year: Not on file  . Ran Out of Food in the Last Year: Not on file  Transportation Needs:   . Lack of Transportation (Medical): Not on file  . Lack of Transportation (Non-Medical): Not on file  Physical Activity:   . Days of Exercise per Week: Not on file  . Minutes of Exercise per Session: Not on file  Stress:   . Feeling of Stress : Not on file  Social Connections:   . Frequency of Communication with Friends and Family: Not on file  . Frequency of Social Gatherings with Friends and Family: Not on file  . Attends Religious Services: Not on file  . Active Member of Clubs or Organizations: Not on file  . Attends Archivist Meetings: Not on file  . Marital Status: Not on file    Subjective:*** Review of Systems  Constitutional: Negative for chills, fever, malaise/fatigue and weight loss.  HENT: Negative for congestion and sore throat.   Respiratory: Negative for cough and shortness of breath.   Cardiovascular: Negative for chest pain and palpitations.  Gastrointestinal: Negative for abdominal pain, blood in stool, diarrhea, melena, nausea and vomiting.  Musculoskeletal: Negative for joint pain and myalgias.  Skin: Negative for rash.  Neurological: Negative for dizziness and weakness.  Endo/Heme/Allergies: Does not bruise/bleed easily.  Psychiatric/Behavioral: Negative for depression. The patient is not nervous/anxious.   All other systems reviewed and are negative.    Objective: There were no vitals taken for this visit. Physical Exam Vitals and nursing note reviewed.   Constitutional:      General: She is not in acute distress.    Appearance: Normal appearance. She is well-developed. She is not ill-appearing, toxic-appearing or diaphoretic.  HENT:     Head: Normocephalic and atraumatic.     Nose: No congestion or rhinorrhea.  Eyes:     General: No scleral icterus. Cardiovascular:     Rate and Rhythm: Normal rate and regular rhythm.     Heart sounds: Normal heart sounds.  Pulmonary:     Effort: Pulmonary effort is normal. No respiratory distress.     Breath sounds: Normal breath sounds.  Abdominal:     General: Bowel sounds are normal.     Palpations: Abdomen is soft. There is no hepatomegaly, splenomegaly or mass.     Tenderness: There is no abdominal tenderness. There is no guarding or rebound.     Hernia: No hernia is present.  Skin:    General: Skin is warm and dry.     Coloration: Skin is not jaundiced.     Findings: No rash.  Neurological:     General: No focal deficit present.     Mental Status: She is alert and oriented to person, place, and time.  Psychiatric:        Attention and Perception: Attention normal.        Mood and Affect: Mood normal.        Speech: Speech normal.        Behavior: Behavior normal.        Thought Content: Thought content normal.        Cognition and Memory: Cognition and memory normal.      Assessment:  ***   Plan: ***    Thank you for allowing [place] to participate in the care of Berton Mount, DNP, AGNP-C Adult & Gerontological Nurse Practitioner [facility]   08/03/2020 12:19 PM   Disclaimer: This note was dictated with voice recognition software. Similar sounding words can inadvertently be transcribed and may not be corrected upon review.

## 2020-08-09 ENCOUNTER — Telehealth: Payer: Self-pay | Admitting: *Deleted

## 2020-08-09 NOTE — Telephone Encounter (Signed)
Called pt to offer sooner appt for procedure but she declined at this time

## 2020-08-13 ENCOUNTER — Ambulatory Visit (HOSPITAL_COMMUNITY)
Admission: RE | Admit: 2020-08-13 | Discharge: 2020-08-13 | Disposition: A | Discharge status: Home / Self Care | Payer: Medicare Other | Source: Ambulatory Visit | Attending: Family Medicine | Admitting: Family Medicine

## 2020-08-13 ENCOUNTER — Other Ambulatory Visit: Payer: Self-pay

## 2020-08-13 DIAGNOSIS — R921 Mammographic calcification found on diagnostic imaging of breast: Secondary | ICD-10-CM | POA: Diagnosis not present

## 2020-08-13 DIAGNOSIS — R928 Other abnormal and inconclusive findings on diagnostic imaging of breast: Secondary | ICD-10-CM | POA: Insufficient documentation

## 2020-08-13 DIAGNOSIS — G4733 Obstructive sleep apnea (adult) (pediatric): Secondary | ICD-10-CM | POA: Diagnosis not present

## 2020-09-13 DIAGNOSIS — G4733 Obstructive sleep apnea (adult) (pediatric): Secondary | ICD-10-CM | POA: Diagnosis not present

## 2020-09-30 ENCOUNTER — Telehealth: Payer: Self-pay | Admitting: *Deleted

## 2020-09-30 NOTE — Telephone Encounter (Signed)
PA [place].  AUTH# [id number] DOS 10/18/2020-01/16/2021

## 2020-10-13 NOTE — Patient Instructions (Signed)
Aimee Reed  10/13/2020     @PREFPERIOPPHARMACY @   Your procedure is scheduled on  10/18/2020.   Report to [facility] at  [phone]  A.M.   Call this number if you have problems the morning of surgery:  [phone]   Remember:  Follow the diet instructions given to you by the office .                      Take these medicines the morning of surgery with A SIP OF WATER          Cymbalta, prilosec, inderal.    DO NOT take any medications for diabetes the morning of your procedure.   Please brush your teeth.  Do not wear jewelry, make-up or nail polish.  Do not wear lotions, powders, or perfumes, or deodorant.  Do not shave 48 hours prior to surgery.  Men may shave face and neck.  Do not bring valuables to the hospital.  [facility] is not responsible for any belongings or valuables.  Contacts, dentures or bridgework may not be worn into surgery.  Leave your suitcase in the car.  After surgery it may be brought to your room.  For patients admitted to the hospital, discharge time will be determined by your treatment team.  Patients discharged the day of surgery will not be allowed to drive home and must have someone with them for 24 hours.   Special instructions:   DO NOT smoke tobacco or vape the morning of your procedure.  Please read over the following fact sheets that you were given. Anesthesia Post-op Instructions and Care and Recovery After Surgery       Upper Endoscopy, Adult, Care After This sheet gives you information about how to care for yourself after your procedure. Your health care provider may also give you more specific instructions. If you have problems or questions, contact your health care provider. What can I expect after the procedure? After the procedure, it is common to have:  A sore throat.  Mild stomach pain or discomfort.  Bloating.  Nausea. Follow these instructions at home:  Follow instructions from your health care provider  about what to eat or drink after your procedure.  Return to your normal activities as told by your health care provider. Ask your health care provider what activities are safe for you.  Take over-the-counter and prescription medicines only as told by your health care provider.  If you were given a sedative during the procedure, it can affect you for several hours. Do not drive or operate machinery until your health care provider says that it is safe.  Keep all follow-up visits as told by your health care provider. This is important.   Contact a health care provider if you have:  A sore throat that lasts longer than one day.  Trouble swallowing. Get help right away if:  You vomit blood or your vomit looks like coffee grounds.  You have: ? A fever. ? Bloody, black, or tarry stools. ? A severe sore throat or you cannot swallow. ? Difficulty breathing. ? Severe pain in your chest or abdomen. Summary  After the procedure, it is common to have a sore throat, mild stomach discomfort, bloating, and nausea.  If you were given a sedative during the procedure, it can affect you for several hours. Do not drive or operate machinery until your health care provider says that it is  safe.  Follow instructions from your health care provider about what to eat or drink after your procedure.  Return to your normal activities as told by your health care provider. This information is not intended to replace advice given to you by your health care provider. Make sure you discuss any questions you have with your health care provider. Document Revised: 08/26/2019 Document Reviewed: 01/28/2018 Elsevier Patient Education  2021 [place].  https://www.asge.org/home/for-patients/patient-information/understanding-eso-dilation-updated">  Esophageal Dilatation Esophageal dilatation, also called esophageal dilation, is a procedure to widen or open a blocked or narrowed part of the esophagus. The esophagus  is the part of the body that moves food and liquid from the mouth to the stomach. You may need this procedure if:  You have a buildup of scar tissue in your esophagus that makes it difficult, painful, or impossible to swallow. This can be caused by gastroesophageal reflux disease (GERD).  You have cancer of the esophagus.  There is a problem with how food moves through your esophagus. In some cases, you may need this procedure repeated at a later time to dilate the esophagus gradually. Tell a health care provider about:  Any allergies you have.  All medicines you are taking, including vitamins, herbs, eye drops, creams, and over-the-counter medicines.  Any problems you or family members have had with anesthetic medicines.  Any blood disorders you have.  Any surgeries you have had.  Any medical conditions you have.  Any antibiotic medicines you are required to take before dental procedures.  Whether you are pregnant or may be pregnant. What are the risks? Generally, this is a safe procedure. However, problems may occur, including:  Bleeding due to a tear in the lining of the esophagus.  A hole, or perforation, in the esophagus. What happens before the procedure?  Ask your health care provider about: ? Changing or stopping your regular medicines. This is especially important if you are taking diabetes medicines or blood thinners. ? Taking medicines such as aspirin and ibuprofen. These medicines can thin your blood. Do not take these medicines unless your health care provider tells you to take them. ? Taking over-the-counter medicines, vitamins, herbs, and supplements.  Follow instructions from your health care provider about eating or drinking restrictions.  Plan to have a responsible adult take you home from the hospital or clinic.  Plan to have a responsible adult care for you for the time you are told after you leave the hospital or clinic. This is important. What happens  during the procedure?  You may be given a medicine to help you relax (sedative).  A numbing medicine may be sprayed into the back of your throat, or you may gargle the medicine.  Your health care provider may perform the dilatation using various surgical instruments, such as: ? Simple dilators. This instrument is carefully placed in the esophagus to stretch it. ? Guided wire bougies. This involves using an endoscope to insert a wire into the esophagus. A dilator is passed over this wire to enlarge the esophagus. Then the wire is removed. ? Balloon dilators. An endoscope with a small balloon is inserted into the esophagus. The balloon is inflated to stretch the esophagus and open it up. The procedure may vary among health care providers and hospitals. What can I expect after the procedure?  Your blood pressure, heart rate, breathing rate, and blood oxygen level will be monitored until you leave the hospital or clinic.  Your throat may feel slightly sore and numb. This will  get better over time.  You will not be allowed to eat or drink until your throat is no longer numb.  When you are able to drink, urinate, and sit on the edge of the bed without nausea or dizziness, you may be able to return home. Follow these instructions at home:  Take over-the-counter and prescription medicines only as told by your health care provider.  If you were given a sedative during the procedure, it can affect you for several hours. Do not drive or operate machinery until your health care provider says that it is safe.  Plan to have a responsible adult care for you for the time you are told. This is important.  Follow instructions from your health care provider about any eating or drinking restrictions.  Do not use any products that contain nicotine or tobacco, such as cigarettes, e-cigarettes, and chewing tobacco. If you need help quitting, ask your health care provider.  Keep all follow-up visits. This is  important. Contact a health care provider if:  You have a fever.  You have pain that is not relieved by medicine. Get help right away if:  You have chest pain.  You have trouble breathing.  You have trouble swallowing.  You vomit blood.  You have black, tarry, or bloody stools. These symptoms may represent a serious problem that is an emergency. Do not wait to see if the symptoms will go away. Get medical help right away. Call your local emergency services (911 in the [place] Do not drive yourself to the hospital. Summary  Esophageal dilatation, also called esophageal dilation, is a procedure to widen or open a blocked or narrowed part of the esophagus.  Plan to have a responsible adult take you home from the hospital or clinic.  For this procedure, a numbing medicine may be sprayed into the back of your throat, or you may gargle the medicine.  Do not drive or operate machinery until your health care provider says that it is safe. This information is not intended to replace advice given to you by your health care provider. Make sure you discuss any questions you have with your health care provider. Document Revised: 01/14/2020 Document Reviewed: 01/14/2020 Elsevier Patient Education  2021 [place] After This sheet gives you information about how to care for yourself after your procedure. Your health care provider may also give you more specific instructions. If you have problems or questions, contact your health care provider. What can I expect after the procedure? After the procedure, it is common to have:  Tiredness.  Forgetfulness about what happened after the procedure.  Impaired judgment for important decisions.  Nausea or vomiting.  Some difficulty with balance. Follow these instructions at home: For the time period you were told by your health care provider:  Rest as needed.  Do not participate in activities where you could  fall or become injured.  Do not drive or use machinery.  Do not drink alcohol.  Do not take sleeping pills or medicines that cause drowsiness.  Do not make important decisions or sign legal documents.  Do not take care of children on your own.      Eating and drinking  Follow the diet that is recommended by your health care provider.  Drink enough fluid to keep your urine pale yellow.  If you vomit: ? Drink water, juice, or soup when you can drink without vomiting. ? Make sure you have little or no nausea before  eating solid foods. General instructions  Have a responsible adult stay with you for the time you are told. It is important to have someone help care for you until you are awake and alert.  Take over-the-counter and prescription medicines only as told by your health care provider.  If you have sleep apnea, surgery and certain medicines can increase your risk for breathing problems. Follow instructions from your health care provider about wearing your sleep device: ? Anytime you are sleeping, including during daytime naps. ? While taking prescription pain medicines, sleeping medicines, or medicines that make you drowsy.  Avoid smoking.  Keep all follow-up visits as told by your health care provider. This is important. Contact a health care provider if:  You keep feeling nauseous or you keep vomiting.  You feel light-headed.  You are still sleepy or having trouble with balance after 24 hours.  You develop a rash.  You have a fever.  You have redness or swelling around the IV site. Get help right away if:  You have trouble breathing.  You have new-onset confusion at home. Summary  For several hours after your procedure, you may feel tired. You may also be forgetful and have poor judgment.  Have a responsible adult stay with you for the time you are told. It is important to have someone help care for you until you are awake and alert.  Rest as told. Do  not drive or operate machinery. Do not drink alcohol or take sleeping pills.  Get help right away if you have trouble breathing, or if you suddenly become confused. This information is not intended to replace advice given to you by your health care provider. Make sure you discuss any questions you have with your health care provider. Document Revised: 05/13/2020 Document Reviewed: 07/31/2019 Elsevier Patient Education  2021 [employer].

## 2020-10-14 ENCOUNTER — Telehealth: Payer: Self-pay

## 2020-10-14 ENCOUNTER — Encounter: Payer: Self-pay | Admitting: *Deleted

## 2020-10-14 DIAGNOSIS — G4733 Obstructive sleep apnea (adult) (pediatric): Secondary | ICD-10-CM | POA: Diagnosis not present

## 2020-10-14 NOTE — Telephone Encounter (Signed)
EGD/DIL w/Propofol w/Dr. Gala Romney ASA 3 for 10/18/20 has to be rescheduled per endo.  Called pt, procedure moved to 11/18/20--AM (diabetic). Endo scheduler informed.

## 2020-10-15 ENCOUNTER — Other Ambulatory Visit (HOSPITAL_COMMUNITY): Payer: Medicare Other

## 2020-10-15 ENCOUNTER — Encounter (HOSPITAL_COMMUNITY)
Admission: RE | Admit: 2020-10-15 | Discharge: 2020-10-15 | Disposition: A | Discharge status: Home / Self Care | Payer: Medicare Other | Source: Ambulatory Visit | Attending: Internal Medicine | Admitting: Internal Medicine

## 2020-10-22 DIAGNOSIS — E119 Type 2 diabetes mellitus without complications: Secondary | ICD-10-CM | POA: Diagnosis not present

## 2020-10-22 DIAGNOSIS — E7849 Other hyperlipidemia: Secondary | ICD-10-CM | POA: Diagnosis not present

## 2020-10-22 DIAGNOSIS — L723 Sebaceous cyst: Secondary | ICD-10-CM | POA: Diagnosis not present

## 2020-10-22 DIAGNOSIS — G4733 Obstructive sleep apnea (adult) (pediatric): Secondary | ICD-10-CM | POA: Diagnosis not present

## 2020-10-22 DIAGNOSIS — Z0001 Encounter for general adult medical examination with abnormal findings: Secondary | ICD-10-CM | POA: Diagnosis not present

## 2020-10-22 DIAGNOSIS — J309 Allergic rhinitis, unspecified: Secondary | ICD-10-CM | POA: Diagnosis not present

## 2020-10-22 DIAGNOSIS — Z23 Encounter for immunization: Secondary | ICD-10-CM | POA: Diagnosis not present

## 2020-10-22 DIAGNOSIS — I10 Essential (primary) hypertension: Secondary | ICD-10-CM | POA: Diagnosis not present

## 2020-10-22 DIAGNOSIS — Z1389 Encounter for screening for other disorder: Secondary | ICD-10-CM | POA: Diagnosis not present

## 2020-10-22 DIAGNOSIS — E782 Mixed hyperlipidemia: Secondary | ICD-10-CM | POA: Diagnosis not present

## 2020-10-22 DIAGNOSIS — M1991 Primary osteoarthritis, unspecified site: Secondary | ICD-10-CM | POA: Diagnosis not present

## 2020-10-22 DIAGNOSIS — E039 Hypothyroidism, unspecified: Secondary | ICD-10-CM | POA: Diagnosis not present

## 2020-10-27 DIAGNOSIS — E119 Type 2 diabetes mellitus without complications: Secondary | ICD-10-CM | POA: Diagnosis not present

## 2020-10-27 DIAGNOSIS — H5203 Hypermetropia, bilateral: Secondary | ICD-10-CM | POA: Diagnosis not present

## 2020-10-27 DIAGNOSIS — H2513 Age-related nuclear cataract, bilateral: Secondary | ICD-10-CM | POA: Diagnosis not present

## 2020-10-27 DIAGNOSIS — Z7984 Long term (current) use of oral hypoglycemic drugs: Secondary | ICD-10-CM | POA: Diagnosis not present

## 2020-10-27 DIAGNOSIS — H524 Presbyopia: Secondary | ICD-10-CM | POA: Diagnosis not present

## 2020-10-27 DIAGNOSIS — G245 Blepharospasm: Secondary | ICD-10-CM | POA: Diagnosis not present

## 2020-10-27 DIAGNOSIS — H52223 Regular astigmatism, bilateral: Secondary | ICD-10-CM | POA: Diagnosis not present

## 2020-11-11 DIAGNOSIS — G4733 Obstructive sleep apnea (adult) (pediatric): Secondary | ICD-10-CM | POA: Diagnosis not present

## 2020-11-15 NOTE — Patient Instructions (Signed)
Aimee Reed  11/15/2020     @PREFPERIOPPHARMACY @   Your procedure is scheduled on  11/18/2020   Report to [facility] at  [email or web address].   Call this number if you have problems the morning of surgery:  [phone]   Remember:  Follow the diet instructions given to you by the office.                      Take these medicines the morning of surgery with A SIP OF WATER  Cymbalta, claritin, prilosec, ditropan, propranolol.     Please brush your teeth  Do not wear jewelry, make-up or nail polish.  Do not wear lotions, powders, or perfumes, or deodorant.  Do not shave 48 hours prior to surgery.  Men may shave face and neck.  Do not bring valuables to the hospital.  [facility] is not responsible for any belongings or valuables.  Contacts, dentures or bridgework may not be worn into surgery.  Leave your suitcase in the car.  After surgery it may be brought to your room.  For patients admitted to the hospital, discharge time will be determined by your treatment team.  Patients discharged the day of surgery will not be allowed to drive home and must have someone with them for 24 hours.   Special instructions:  DO NOT smoke tobacco or vape the morning of your procedure.  Please read over the following fact sheets that you were given. Anesthesia Post-op Instructions and Care and Recovery After Surgery       Upper Endoscopy, Adult, Care After This sheet gives you information about how to care for yourself after your procedure. Your health care provider may also give you more specific instructions. If you have problems or questions, contact your health care provider. What can I expect after the procedure? After the procedure, it is common to have:  A sore throat.  Mild stomach pain or discomfort.  Bloating.  Nausea. Follow these instructions at home:  Follow instructions from your health care provider about what to eat or drink after your  procedure.  Return to your normal activities as told by your health care provider. Ask your health care provider what activities are safe for you.  Take over-the-counter and prescription medicines only as told by your health care provider.  If you were given a sedative during the procedure, it can affect you for several hours. Do not drive or operate machinery until your health care provider says that it is safe.  Keep all follow-up visits as told by your health care provider. This is important.   Contact a health care provider if you have:  A sore throat that lasts longer than one day.  Trouble swallowing. Get help right away if:  You vomit blood or your vomit looks like coffee grounds.  You have: ? A fever. ? Bloody, black, or tarry stools. ? A severe sore throat or you cannot swallow. ? Difficulty breathing. ? Severe pain in your chest or abdomen. Summary  After the procedure, it is common to have a sore throat, mild stomach discomfort, bloating, and nausea.  If you were given a sedative during the procedure, it can affect you for several hours. Do not drive or operate machinery until your health care provider says that it is safe.  Follow instructions from your health care provider about what to eat or drink after your procedure.  Return to your normal activities as told by your health care provider. This information is not intended to replace advice given to you by your health care provider. Make sure you discuss any questions you have with your health care provider. Document Revised: 08/26/2019 Document Reviewed: 01/28/2018 Elsevier Patient Education  2021 [place].  https://www.asge.org/home/for-patients/patient-information/understanding-eso-dilation-updated">  Esophageal Dilatation Esophageal dilatation, also called esophageal dilation, is a procedure to widen or open a blocked or narrowed part of the esophagus. The esophagus is the part of the body that moves food  and liquid from the mouth to the stomach. You may need this procedure if:  You have a buildup of scar tissue in your esophagus that makes it difficult, painful, or impossible to swallow. This can be caused by gastroesophageal reflux disease (GERD).  You have cancer of the esophagus.  There is a problem with how food moves through your esophagus. In some cases, you may need this procedure repeated at a later time to dilate the esophagus gradually. Tell a health care provider about:  Any allergies you have.  All medicines you are taking, including vitamins, herbs, eye drops, creams, and over-the-counter medicines.  Any problems you or family members have had with anesthetic medicines.  Any blood disorders you have.  Any surgeries you have had.  Any medical conditions you have.  Any antibiotic medicines you are required to take before dental procedures.  Whether you are pregnant or may be pregnant. What are the risks? Generally, this is a safe procedure. However, problems may occur, including:  Bleeding due to a tear in the lining of the esophagus.  A hole, or perforation, in the esophagus. What happens before the procedure?  Ask your health care provider about: ? Changing or stopping your regular medicines. This is especially important if you are taking diabetes medicines or blood thinners. ? Taking medicines such as aspirin and ibuprofen. These medicines can thin your blood. Do not take these medicines unless your health care provider tells you to take them. ? Taking over-the-counter medicines, vitamins, herbs, and supplements.  Follow instructions from your health care provider about eating or drinking restrictions.  Plan to have a responsible adult take you home from the hospital or clinic.  Plan to have a responsible adult care for you for the time you are told after you leave the hospital or clinic. This is important. What happens during the procedure?  You may be  given a medicine to help you relax (sedative).  A numbing medicine may be sprayed into the back of your throat, or you may gargle the medicine.  Your health care provider may perform the dilatation using various surgical instruments, such as: ? Simple dilators. This instrument is carefully placed in the esophagus to stretch it. ? Guided wire bougies. This involves using an endoscope to insert a wire into the esophagus. A dilator is passed over this wire to enlarge the esophagus. Then the wire is removed. ? Balloon dilators. An endoscope with a small balloon is inserted into the esophagus. The balloon is inflated to stretch the esophagus and open it up. The procedure may vary among health care providers and hospitals. What can I expect after the procedure?  Your blood pressure, heart rate, breathing rate, and blood oxygen level will be monitored until you leave the hospital or clinic.  Your throat may feel slightly sore and numb. This will get better over time.  You will not be allowed to eat or drink until your throat is no  longer numb.  When you are able to drink, urinate, and sit on the edge of the bed without nausea or dizziness, you may be able to return home. Follow these instructions at home:  Take over-the-counter and prescription medicines only as told by your health care provider.  If you were given a sedative during the procedure, it can affect you for several hours. Do not drive or operate machinery until your health care provider says that it is safe.  Plan to have a responsible adult care for you for the time you are told. This is important.  Follow instructions from your health care provider about any eating or drinking restrictions.  Do not use any products that contain nicotine or tobacco, such as cigarettes, e-cigarettes, and chewing tobacco. If you need help quitting, ask your health care provider.  Keep all follow-up visits. This is important. Contact a health care  provider if:  You have a fever.  You have pain that is not relieved by medicine. Get help right away if:  You have chest pain.  You have trouble breathing.  You have trouble swallowing.  You vomit blood.  You have black, tarry, or bloody stools. These symptoms may represent a serious problem that is an emergency. Do not wait to see if the symptoms will go away. Get medical help right away. Call your local emergency services (911 in the [place] Do not drive yourself to the hospital. Summary  Esophageal dilatation, also called esophageal dilation, is a procedure to widen or open a blocked or narrowed part of the esophagus.  Plan to have a responsible adult take you home from the hospital or clinic.  For this procedure, a numbing medicine may be sprayed into the back of your throat, or you may gargle the medicine.  Do not drive or operate machinery until your health care provider says that it is safe. This information is not intended to replace advice given to you by your health care provider. Make sure you discuss any questions you have with your health care provider. Document Revised: 01/14/2020 Document Reviewed: 01/14/2020 Elsevier Patient Education  2021 [place] After This sheet gives you information about how to care for yourself after your procedure. Your health care provider may also give you more specific instructions. If you have problems or questions, contact your health care provider. What can I expect after the procedure? After the procedure, it is common to have:  Tiredness.  Forgetfulness about what happened after the procedure.  Impaired judgment for important decisions.  Nausea or vomiting.  Some difficulty with balance. Follow these instructions at home: For the time period you were told by your health care provider:  Rest as needed.  Do not participate in activities where you could fall or become injured.  Do  not drive or use machinery.  Do not drink alcohol.  Do not take sleeping pills or medicines that cause drowsiness.  Do not make important decisions or sign legal documents.  Do not take care of children on your own.      Eating and drinking  Follow the diet that is recommended by your health care provider.  Drink enough fluid to keep your urine pale yellow.  If you vomit: ? Drink water, juice, or soup when you can drink without vomiting. ? Make sure you have little or no nausea before eating solid foods. General instructions  Have a responsible adult stay with you for the time you are  told. It is important to have someone help care for you until you are awake and alert.  Take over-the-counter and prescription medicines only as told by your health care provider.  If you have sleep apnea, surgery and certain medicines can increase your risk for breathing problems. Follow instructions from your health care provider about wearing your sleep device: ? Anytime you are sleeping, including during daytime naps. ? While taking prescription pain medicines, sleeping medicines, or medicines that make you drowsy.  Avoid smoking.  Keep all follow-up visits as told by your health care provider. This is important. Contact a health care provider if:  You keep feeling nauseous or you keep vomiting.  You feel light-headed.  You are still sleepy or having trouble with balance after 24 hours.  You develop a rash.  You have a fever.  You have redness or swelling around the IV site. Get help right away if:  You have trouble breathing.  You have new-onset confusion at home. Summary  For several hours after your procedure, you may feel tired. You may also be forgetful and have poor judgment.  Have a responsible adult stay with you for the time you are told. It is important to have someone help care for you until you are awake and alert.  Rest as told. Do not drive or operate machinery.  Do not drink alcohol or take sleeping pills.  Get help right away if you have trouble breathing, or if you suddenly become confused. This information is not intended to replace advice given to you by your health care provider. Make sure you discuss any questions you have with your health care provider. Document Revised: 05/13/2020 Document Reviewed: 07/31/2019 Elsevier Patient Education  2021 [employer].

## 2020-11-16 ENCOUNTER — Other Ambulatory Visit (HOSPITAL_COMMUNITY)
Admission: RE | Admit: 2020-11-16 | Discharge: 2020-11-16 | Disposition: A | Discharge status: Home / Self Care | Payer: Medicare Other | Source: Ambulatory Visit | Attending: Internal Medicine | Admitting: Internal Medicine

## 2020-11-16 ENCOUNTER — Other Ambulatory Visit: Payer: Self-pay

## 2020-11-16 ENCOUNTER — Encounter (HOSPITAL_COMMUNITY): Payer: Self-pay

## 2020-11-16 ENCOUNTER — Encounter (HOSPITAL_COMMUNITY)
Admission: RE | Admit: 2020-11-16 | Discharge: 2020-11-16 | Disposition: A | Discharge status: Home / Self Care | Payer: Medicare Other | Source: Ambulatory Visit | Attending: Internal Medicine | Admitting: Internal Medicine

## 2020-11-16 DIAGNOSIS — Z01818 Encounter for other preprocedural examination: Secondary | ICD-10-CM | POA: Insufficient documentation

## 2020-11-16 DIAGNOSIS — H0222C Mechanical lagophthalmos, bilateral, upper and lower eyelids: Secondary | ICD-10-CM | POA: Diagnosis not present

## 2020-11-16 DIAGNOSIS — H02433 Paralytic ptosis of bilateral eyelids: Secondary | ICD-10-CM | POA: Diagnosis not present

## 2020-11-16 DIAGNOSIS — G245 Blepharospasm: Secondary | ICD-10-CM | POA: Diagnosis not present

## 2020-11-16 DIAGNOSIS — Z20822 Contact with and (suspected) exposure to covid-19: Secondary | ICD-10-CM | POA: Diagnosis not present

## 2020-11-16 HISTORY — DX: Sleep apnea, unspecified: G47.30

## 2020-11-16 LAB — BASIC METABOLIC PANEL
Anion gap: 10 (ref 5–15)
BUN: 15 mg/dL (ref 8–23)
CO2: 25 mmol/L (ref 22–32)
Calcium: 9.6 mg/dL (ref 8.9–10.3)
Chloride: 105 mmol/L (ref 98–111)
Creatinine, Ser: 0.48 mg/dL (ref 0.44–1.00)
GFR, Estimated: >60 mL/min (ref >60)
Glucose, Bld: 145 mg/dL — ABNORMAL HIGH (ref 70–99)
Potassium: 4.1 mmol/L (ref 3.5–5.1)
Sodium: 140 mmol/L (ref 135–145)

## 2020-11-16 LAB — SARS CORONAVIRUS 2 (TAT 6-24 HRS): SARS Coronavirus 2: NEGATIVE

## 2020-11-18 ENCOUNTER — Other Ambulatory Visit: Payer: Self-pay

## 2020-11-18 ENCOUNTER — Ambulatory Visit (HOSPITAL_COMMUNITY): Payer: Medicare Other | Admitting: Anesthesiology

## 2020-11-18 ENCOUNTER — Ambulatory Visit (HOSPITAL_COMMUNITY)
Admission: RE | Admit: 2020-11-18 | Discharge: 2020-11-18 | Disposition: A | Discharge status: Home / Self Care | Payer: Medicare Other | Attending: Internal Medicine | Admitting: Internal Medicine

## 2020-11-18 ENCOUNTER — Encounter (HOSPITAL_COMMUNITY)
Admission: RE | Disposition: A | Discharge status: Home / Self Care | Payer: Self-pay | Source: Home / Self Care | Attending: Internal Medicine

## 2020-11-18 ENCOUNTER — Ambulatory Visit: Payer: Medicare Other | Admitting: General Surgery

## 2020-11-18 ENCOUNTER — Encounter (HOSPITAL_COMMUNITY): Payer: Self-pay | Admitting: Internal Medicine

## 2020-11-18 DIAGNOSIS — Z7982 Long term (current) use of aspirin: Secondary | ICD-10-CM | POA: Diagnosis not present

## 2020-11-18 DIAGNOSIS — Z7984 Long term (current) use of oral hypoglycemic drugs: Secondary | ICD-10-CM | POA: Diagnosis not present

## 2020-11-18 DIAGNOSIS — Z79899 Other long term (current) drug therapy: Secondary | ICD-10-CM | POA: Diagnosis not present

## 2020-11-18 DIAGNOSIS — R1310 Dysphagia, unspecified: Secondary | ICD-10-CM | POA: Diagnosis not present

## 2020-11-18 DIAGNOSIS — R1314 Dysphagia, pharyngoesophageal phase: Secondary | ICD-10-CM | POA: Insufficient documentation

## 2020-11-18 DIAGNOSIS — K2270 Barrett's esophagus without dysplasia: Secondary | ICD-10-CM | POA: Insufficient documentation

## 2020-11-18 DIAGNOSIS — K449 Diaphragmatic hernia without obstruction or gangrene: Secondary | ICD-10-CM | POA: Insufficient documentation

## 2020-11-18 HISTORY — PX: MALONEY DILATION: SHX5535

## 2020-11-18 HISTORY — PX: ESOPHAGOGASTRODUODENOSCOPY (EGD) WITH PROPOFOL: SHX5813

## 2020-11-18 HISTORY — PX: BIOPSY: SHX5522

## 2020-11-18 LAB — GLUCOSE, CAPILLARY: Glucose-Capillary: 136 mg/dL — ABNORMAL HIGH (ref 70–99)

## 2020-11-18 SURGERY — ESOPHAGOGASTRODUODENOSCOPY (EGD) WITH PROPOFOL
Anesthesia: General

## 2020-11-18 MED ORDER — LIDOCAINE VISCOUS HCL 2 % MT SOLN
15.0000 mL | Freq: Once | OROMUCOSAL | Status: AC
  Administered 2020-11-18: 15 mL via OROMUCOSAL

## 2020-11-18 MED ORDER — PROPOFOL 10 MG/ML IV BOLUS
INTRAVENOUS | Status: DC | PRN
  Administered 2020-11-18 (×5): 50 mg via INTRAVENOUS

## 2020-11-18 MED ORDER — STERILE WATER FOR IRRIGATION IR SOLN
Status: DC | PRN
  Administered 2020-11-18: 100 mL

## 2020-11-18 MED ORDER — LIDOCAINE VISCOUS HCL 2 % MT SOLN
OROMUCOSAL | Status: AC
  Filled 2020-11-18: qty 15

## 2020-11-18 MED ORDER — LACTATED RINGERS IV SOLN: INTRAVENOUS | Status: DC

## 2020-11-18 MED ORDER — LIDOCAINE HCL (CARDIAC) PF 100 MG/5ML IV SOSY
PREFILLED_SYRINGE | INTRAVENOUS | Status: DC | PRN
  Administered 2020-11-18: 60 mg via INTRAVENOUS

## 2020-11-18 MED ORDER — CHLORHEXIDINE GLUCONATE CLOTH 2 % EX PADS: 6.0000 | MEDICATED_PAD | Freq: Once | CUTANEOUS | Status: DC

## 2020-11-18 NOTE — Addendum Note (Signed)
Addendum  created 11/18/20 0830 by Jonna Munro, CRNA   Intraprocedure Meds edited

## 2020-11-18 NOTE — H&P (Signed)
@LOGO @   Primary Care Physician:  Sharilyn Sites, MD Primary Gastroenterologist:  Dr. Gala Romney  Pre-Procedure History & Physical: HPI:  Aimee Reed is a 65 y.o. female here for for further evaluation management of esophageal dysphagia.  Got a nice result from dilation previously.  Question of short segment Barrett's esophagus.  Reflux symptoms well controlled on Prilosec 40 mg twice daily.  Past Medical History:  Diagnosis Date  . Anxiety   . Blepharospasm   . Depression   . Diabetes mellitus, type II ([place])   . GERD (gastroesophageal reflux disease)   . HTN (hypertension)   . Hyperlipidemia 03/11/2012  . Irritable bowel syndrome   . Overactive bladder   . Recurrent UTI    chronic macrodantin  . Sleep apnea     Past Surgical History:  Procedure Laterality Date  . CARPAL TUNNEL RELEASE     both hands  . CHOLECYSTECTOMY    . COLONOSCOPY     Dr. Arnoldo Morale, around 2010  . COLONOSCOPY N/A 02/25/2013   [facility]:CWCBJSE polyps, next tcs 02/2018 give tubular adenomas  . ESOPHAGOGASTRODUODENOSCOPY  07/31/2011   [facility]:DVVOHY cervical esophageal web s/p dilation,query short segment Barrett's esophagus s/p bx (Barrett's esophagus without dysplasia),small hiatal hernia. 77 French Maloney dilator.  . ESOPHAGOGASTRODUODENOSCOPY (EGD) WITH ESOPHAGEAL DILATION N/A 02/25/2013   [facility]:XTGGYIRS distal esophagus/HH. next EGD 02/2016 given prior h/o Barrett's  . EYE SURGERY  November 2016   Eyelids, nerve issues    Prior to Admission medications   Medication Sig Start Date End Date Taking? Authorizing Provider  acetaminophen (TYLENOL) 500 MG tablet Take 1,000 mg by mouth every 6 (six) hours as needed for moderate pain.   Yes [provider]  Apple Cider Vinegar 500 MG TABS Take 500 mg by mouth in the morning and at bedtime.   Yes [provider]  aspirin EC 81 MG tablet Take 81 mg by mouth at bedtime.   Yes [provider]  aspirin-acetaminophen-caffeine (EXCEDRIN MIGRAINE)  [phone] MG tablet Take 1 tablet by mouth every 6 (six) hours as needed for headache.   Yes [provider]  Biotin w/ Vitamins C & E (HAIR SKIN & NAILS GUMMIES PO) Take 3 capsules by mouth at bedtime.   Yes [provider]  Calcium Carbonate-Vitamin D (CALTRATE 600+D PO) Take 1 tablet by mouth daily.   Yes [provider]  Cinnamon 500 MG capsule Take 500 mg by mouth in the morning and at bedtime.   Yes [provider]  Cyanocobalamin (B-12) 5000 MCG CAPS Take 5,000 mcg by mouth daily.   Yes [provider]  diphenhydramine-acetaminophen (TYLENOL PM) 25-500 MG TABS tablet Take 1 tablet by mouth at bedtime as needed (sleep).   Yes [provider]  DULoxetine (CYMBALTA) 60 MG capsule Take 60 mg by mouth daily.   Yes [provider]  JARDIANCE 25 MG TABS tablet TAKE 1 TABLET BY MOUTH  DAILY Patient taking differently: Take 25 mg by mouth daily. 11/24/16  Yes Nida, Marella Chimes, MD  loratadine (CLARITIN) 10 MG tablet Take 10 mg by mouth daily.   Yes [provider]  losartan (COZAAR) 100 MG tablet Take 100 mg by mouth daily.   Yes [provider]  naproxen sodium (ALEVE) 220 MG tablet Take 440 mg by mouth 2 (two) times daily as needed (pain).   Yes [provider]  omeprazole (PRILOSEC) 40 MG capsule Take 40 mg by mouth 2 (two) times daily. 05/12/15  Yes [provider]  oxybutynin (DITROPAN) 5 MG tablet Take 5 mg by mouth 2 (two) times daily.   Yes [provider]  propranolol (INDERAL) 10 MG tablet Take 1-2 tablets (10-20 mg total) by mouth 2 (two) times daily. Takes 2 tablets (20mg ) in the mornings and 1 tablet (10mg ) at bedtime Patient taking differently: Take 10-20 mg by mouth See admin instructions. Takes 2 tablets (20mg ) in the mornings and 1 tablet (10mg ) at bedtime 02/24/13  Yes Darrol Jump, MD  rosuvastatin (CRESTOR) 5 MG tablet Take 5 mg by mouth every Saturday.   Yes [provider]  sitaGLIPtin-metformin (JANUMET) 50-1000 MG tablet Take 1 tablet by mouth 2 (two) times daily with a meal.   Yes [provider]  zolpidem (AMBIEN) 5 MG tablet 2.5- 5 mg at night as needed for sleep Patient taking differently: Take 2.5-5 mg by mouth at bedtime. 07/25/18  Yes Hisada, Elie Goody, MD  DULoxetine (CYMBALTA) 30 MG capsule 90 mg daily (60 mg + 30 mg) Patient not taking: No sig reported 09/30/18   Norman Clay, MD  OnabotulinumtoxinA (BOTOX IJ) Inject as directed. Every 4 months    [provider]    Allergies as of 04/29/2020  . (No Known Allergies)    Family History  Problem Relation Age of Onset  . Depression Father   . Ulcers Mother   . Alzheimer's disease Mother   . Dementia Mother   . Alzheimer's disease Maternal Grandmother   . Dementia Maternal Grandmother   . Diabetes Paternal Grandfather   . Other Paternal Grandmother        died during childbirth  . Other Brother        back problems  . Cancer Sister        lung  . Dementia Brother   . Alzheimer's disease Brother   . Colon cancer Neg Hx   . Liver disease Neg Hx   . ADD / ADHD Neg Hx   . Alcohol abuse Neg Hx   . Drug abuse Neg Hx   . Anxiety disorder Neg Hx   . OCD Neg Hx   . Bipolar disorder Neg Hx   . Paranoid behavior Neg Hx   . Schizophrenia Neg Hx   . Seizures Neg Hx   . Sexual abuse Neg Hx   . Physical abuse Neg Hx   . Gastric cancer Neg Hx   . Esophageal cancer Neg Hx     Social History   Socioeconomic History  . Marital status: Married    Spouse name: Not on file  . Number of children: 2  . Years of education: Not on file  . Highest education level: Not on file  Occupational History  . Occupation: unemployed    Fish farm manager: UNEMPLOYED  Tobacco Use  . Smoking status: Never Smoker  . Smokeless tobacco: Never Used  Vaping Use  . Vaping Use: Never used  Substance and Sexual Activity  . Alcohol use: No  . Drug use: No  . Sexual activity: Not Currently     Birth control/protection: Post-menopausal  Other Topics Concern  . Not on file  Social History Narrative  . Not on file   Social Determinants of Health   Financial Resource Strain: Not on file  Food Insecurity: Not on file  Transportation Needs: Not on file  Physical Activity: Not on file  Stress: Not on file  Social Connections: Not on file  Intimate Partner Violence: Not on file    Review of Systems: See HPI,  otherwise negative ROS  Physical Exam: BP (!) 170/73   Pulse 87   Temp 97.9 F (36.6 C) (Oral)   SpO2 97%  General:   Alert,  Well-developed, well-nourished, pleasant and cooperative in NAD Neck:  Supple; no masses or thyromegaly. No significant cervical adenopathy. Lungs:  Clear throughout to auscultation.   No wheezes, crackles, or rhonchi. No acute distress. Heart:  Regular rate and rhythm; no murmurs, clicks, rubs,  or gallops. Abdomen: Non-distended, normal bowel sounds.  Soft and nontender without appreciable mass or hepatosplenomegaly.  Pulses:  Normal pulses noted. Extremities:  Without clubbing or edema.  Impression/Plan: 64 year old lady with recurrent esophageal dysphagia in the setting of GERD.  Question history of short segment Barrett's esophagus here for further evaluation via EGD and possible esophageal dilation as feasible/appropriate per plan. The risks, benefits, limitations, alternatives and imponderables have been reviewed with the patient. Potential for esophageal dilation, biopsy, etc. have also been reviewed.  Questions have been answered. All parties agreeable.     Notice: This dictation was prepared with Dragon dictation along with smaller phrase technology. Any transcriptional errors that result from this process are unintentional and may not be corrected upon review.

## 2020-11-18 NOTE — Transfer of Care (Signed)
Immediate Anesthesia Transfer of Care Note  Patient: Aimee Reed  Procedure(s) Performed: ESOPHAGOGASTRODUODENOSCOPY (EGD) WITH PROPOFOL (N/A ) MALONEY DILATION (N/A ) BIOPSY  Patient Location: PACU  Anesthesia Type:General  Level of Consciousness: awake, alert , oriented and patient cooperative  Airway & Oxygen Therapy: Patient Spontanous Breathing  Post-op Assessment: Report given to RN, Post -op Vital signs reviewed and stable and Patient moving all extremities X 4  Post vital signs: Reviewed and stable  Last Vitals:  Vitals Value Taken Time  BP    Temp    Pulse    Resp    SpO2      Last Pain:  Vitals:   11/18/20 0802  TempSrc:   PainSc: 0-No pain         Complications: No complications documented.

## 2020-11-18 NOTE — Op Note (Addendum)
[facility] Patient Name: Aimee Reed Procedure Date: 11/18/2020 7:51 AM MRN: [id number] Date of Birth: April 02, 1956 Attending MD: Norvel Richards , MD CSN: [id number] Age: 65 Admit Type: Outpatient Procedure:                Upper GI endoscopy Indications:              Dysphagia Providers:                Norvel Richards, MD, Aimee Reed, Aimee                            Risa Reed, Technician Referring MD:              Medicines:                Propofol per Anesthesia Complications:            No immediate complications. Estimated Blood Loss:     Estimated blood loss was minimal. Procedure:                Pre-Anesthesia Assessment:                           - Prior to the procedure, a History and Physical                            was performed, and patient medications and                            allergies were reviewed. The patient's tolerance of                            previous anesthesia was also reviewed. The risks                            and benefits of the procedure and the sedation                            options and risks were discussed with the patient.                            All questions were answered, and informed consent                            was obtained. Prior Anticoagulants: The patient has                            taken no previous anticoagulant or antiplatelet                            agents. ASA Grade Assessment: III - A patient with                            severe systemic disease. After reviewing the risks  and benefits, the patient was deemed in                            satisfactory condition to undergo the procedure.                           After obtaining informed consent, the endoscope was                            passed under direct vision. Throughout the                            procedure, the patient's blood pressure, pulse, and                            oxygen  saturations were monitored continuously. The                            [id number]) was introduced through the mouth,                            and advanced to the second part of duodenum. The                            upper GI endoscopy was accomplished without                            difficulty. The patient tolerated the procedure                            well. Scope In: 8:05:40 AM Scope Out: 8:15:31 AM Total Procedure Duration: 0 hours 9 minutes 51 seconds  Findings:      The examined esophagus was normal aside from a 1cm "tongue of       salmon-colored epithelium. No esophagitis. No nodularity..      A small hiatal hernia was present.      The stomach was otherwise without abnormality.      The duodenal bulb and second portion of the duodenum were normal. The       scope was withdrawn. Dilation was performed with a Maloney dilator with       mild resistance at 56 Fr. The dilation site was examined following       endoscope reinsertion and showed no change. Estimated blood loss: none.       Finally, biopsies the abnormal distal esophagus taken for histologic       study. Impression:               - Normal esophagus. Status post dilation followed                            by biopsy                           - Small hiatal hernia.                           -  The examination was otherwise normal.                           - Normal duodenal bulb and second portion of the                            duodenum.                           - No specimens collected. Moderate Sedation:      Moderate (conscious) sedation was personally administered by an       anesthesia professional. The following parameters were monitored: oxygen       saturation, heart rate, blood pressure, respiratory rate, EKG, adequacy       of pulmonary ventilation, and response to care. Recommendation:           - Patient has a contact number available for                            emergencies. The signs  and symptoms of potential                            delayed complications were discussed with the                            patient. Return to normal activities tomorrow.                            Written discharge instructions were provided to the                            patient.                           - Advance diet as tolerated. Continue current                            medications. Continue omeprazole 40 mg twice daily Procedure Code(s):        --- Professional ---                           [id number], Esophagogastroduodenoscopy, flexible,                            transoral; diagnostic, including collection of                            specimen(s) by brushing or washing, when performed                            (separate procedure)                           43450, Dilation of esophagus, by unguided sound or  bougie, single or multiple passes Diagnosis Code(s):        --- Professional ---                           K44.9, Diaphragmatic hernia without obstruction or                            gangrene                           R13.10, Dysphagia, unspecified CPT copyright 2019 American Medical Association. All rights reserved. The codes documented in this report are preliminary and upon coder review may  be revised to meet current compliance requirements. Aimee Reed. Aimee Carpenter, MD Norvel Richards, MD 11/18/2020 8:22:27 AM This report has been signed electronically. Number of Addenda: 1 Addendum Number: 1   Addendum Date: 11/19/2020 4:33:35 PM      Abnormal esophagus - S/P biopsy Aimee Reed. Aimee Landgrebe, MD Norvel Richards, MD 11/19/2020 4:34:09 PM This report has been signed electronically.

## 2020-11-18 NOTE — Anesthesia Preprocedure Evaluation (Signed)
Anesthesia Evaluation  Patient identified by MRN, date of birth, ID band Patient awake    Reviewed: Allergy & Precautions, NPO status , Patient's Chart, lab work & pertinent test results, reviewed documented beta blocker date and time   History of Anesthesia Complications Negative for: history of anesthetic complications  Airway Mallampati: II  TM Distance: >3 FB Neck ROM: Full    Dental  (+) Dental Advisory Given, Chipped   Pulmonary sleep apnea and Continuous Positive Airway Pressure Ventilation ,    Pulmonary exam normal breath sounds clear to auscultation       Cardiovascular Exercise Tolerance: Good hypertension, Pt. on medications and Pt. on home beta blockers Normal cardiovascular exam Rhythm:Regular Rate:Normal     Neuro/Psych PSYCHIATRIC DISORDERS Anxiety Depression  Neuromuscular disease    GI/Hepatic Neg liver ROS, GERD  Medicated,  Endo/Other  diabetes, Well Controlled, Type 2, Oral Hypoglycemic Agents  Renal/GU negative Renal ROS     Musculoskeletal negative musculoskeletal ROS (+)   Abdominal   Peds  Hematology   Anesthesia Other Findings   Reproductive/Obstetrics negative OB ROS                           Anesthesia Physical Anesthesia Plan  ASA: III  Anesthesia Plan: General   Post-op Pain Management:    Induction: Intravenous  PONV Risk Score and Plan: Propofol infusion and TIVA  Airway Management Planned: Nasal Cannula and Natural Airway  Additional Equipment:   Intra-op Plan:   Post-operative Plan:   Informed Consent: I have reviewed the patients History and Physical, chart, labs and discussed the procedure including the risks, benefits and alternatives for the proposed anesthesia with the patient or authorized representative who has indicated his/her understanding and acceptance.     Dental advisory given  Plan Discussed with: CRNA and  Surgeon  Anesthesia Plan Comments:        Anesthesia Quick Evaluation

## 2020-11-18 NOTE — Anesthesia Postprocedure Evaluation (Signed)
Anesthesia Post Note  Patient: Aimee Reed  Procedure(s) Performed: ESOPHAGOGASTRODUODENOSCOPY (EGD) WITH PROPOFOL (N/A ) MALONEY DILATION (N/A ) BIOPSY  Patient location during evaluation: Phase II Anesthesia Type: General Level of consciousness: awake, oriented, awake and alert and patient cooperative Pain management: satisfactory to patient Vital Signs Assessment: post-procedure vital signs reviewed and stable Respiratory status: spontaneous breathing, respiratory function stable and nonlabored ventilation Cardiovascular status: stable Postop Assessment: no apparent nausea or vomiting Anesthetic complications: no   No complications documented.   Last Vitals:  Vitals:   11/18/20 0716  BP: (!) 170/73  Pulse: 87  Temp: 36.6 C  SpO2: 97%    Last Pain:  Vitals:   11/18/20 0802  TempSrc:   PainSc: 0-No pain                 Dmarco Baldus

## 2020-11-18 NOTE — Discharge Instructions (Signed)
EGD Discharge instructions Please read the instructions outlined below and refer to this sheet in the next few weeks. These discharge instructions provide you with general information on caring for yourself after you leave the hospital. Your doctor may also give you specific instructions. While your treatment has been planned according to the most current medical practices available, unavoidable complications occasionally occur. If you have any problems or questions after discharge, please call your doctor. ACTIVITY  You may resume your regular activity but move at a slower pace for the next 24 hours.   Take frequent rest periods for the next 24 hours.   Walking will help expel (get rid of) the air and reduce the bloated feeling in your abdomen.   No driving for 24 hours (because of the anesthesia (medicine) used during the test).   You may shower.   Do not sign any important legal documents or operate any machinery for 24 hours (because of the anesthesia used during the test).  NUTRITION  Drink plenty of fluids.   You may resume your normal diet.   Begin with a light meal and progress to your normal diet.   Avoid alcoholic beverages for 24 hours or as instructed by your caregiver.  MEDICATIONS  You may resume your normal medications unless your caregiver tells you otherwise.  WHAT YOU CAN EXPECT TODAY  You may experience abdominal discomfort such as a feeling of fullness or "gas" pains.  FOLLOW-UP  Your doctor will discuss the results of your test with you.  SEEK IMMEDIATE MEDICAL ATTENTION IF ANY OF THE FOLLOWING OCCUR:  Excessive nausea (feeling sick to your stomach) and/or vomiting.   Severe abdominal pain and distention (swelling).   Trouble swallowing.   Temperature over 101 F (37.8 C).   Rectal bleeding or vomiting of blood.   Your esophagus was stretched today.  Biopsies also taken.  Continue omeprazole 40 mg twice daily  Further recommendations to follow  pending review of pathology report  Office follow-up with Walden [facility] in 1 month  At patient request, I called Bill at [phone] answering service.  Left a message.     Monitored Anesthesia Care, Care After This sheet gives you information about how to care for yourself after your procedure. Your health care provider may also give you more specific instructions. If you have problems or questions, contact your health care provider. What can I expect after the procedure? After the procedure, it is common to have:  Tiredness.  Forgetfulness about what happened after the procedure.  Impaired judgment for important decisions.  Nausea or vomiting.  Some difficulty with balance. Follow these instructions at home: For the time period you were told by your health care provider:  Rest as needed.  Do not participate in activities where you could fall or become injured.  Do not drive or use machinery.  Do not drink alcohol.  Do not take sleeping pills or medicines that cause drowsiness.  Do not make important decisions or sign legal documents.  Do not take care of children on your own.      Eating and drinking  Follow the diet that is recommended by your health care provider.  Drink enough fluid to keep your urine pale yellow.  If you vomit: ? Drink water, juice, or soup when you can drink without vomiting. ? Make sure you have little or no nausea before eating solid foods. General instructions  Have a responsible adult stay with you for the time you are told.  It is important to have someone help care for you until you are awake and alert.  Take over-the-counter and prescription medicines only as told by your health care provider.  If you have sleep apnea, surgery and certain medicines can increase your risk for breathing problems. Follow instructions from your health care provider about wearing your sleep device: ? Anytime you are sleeping, including during daytime  naps. ? While taking prescription pain medicines, sleeping medicines, or medicines that make you drowsy.  Avoid smoking.  Keep all follow-up visits as told by your health care provider. This is important. Contact a health care provider if:  You keep feeling nauseous or you keep vomiting.  You feel light-headed.  You are still sleepy or having trouble with balance after 24 hours.  You develop a rash.  You have a fever.  You have redness or swelling around the IV site. Get help right away if:  You have trouble breathing.  You have new-onset confusion at home. Summary  For several hours after your procedure, you may feel tired. You may also be forgetful and have poor judgment.  Have a responsible adult stay with you for the time you are told. It is important to have someone help care for you until you are awake and alert.  Rest as told. Do not drive or operate machinery. Do not drink alcohol or take sleeping pills.  Get help right away if you have trouble breathing, or if you suddenly become confused. This information is not intended to replace advice given to you by your health care provider. Make sure you discuss any questions you have with your health care provider. Document Revised: 05/13/2020 Document Reviewed: 07/31/2019 Elsevier Patient Education  2021 [employer].

## 2020-11-19 ENCOUNTER — Encounter: Payer: Self-pay | Admitting: Internal Medicine

## 2020-11-19 LAB — SURGICAL PATHOLOGY

## 2020-11-23 ENCOUNTER — Encounter (HOSPITAL_COMMUNITY): Payer: Self-pay | Admitting: Internal Medicine

## 2020-12-01 DIAGNOSIS — G245 Blepharospasm: Secondary | ICD-10-CM | POA: Diagnosis not present

## 2020-12-07 ENCOUNTER — Ambulatory Visit: Payer: Medicare Other | Admitting: General Surgery

## 2020-12-12 DIAGNOSIS — G4733 Obstructive sleep apnea (adult) (pediatric): Secondary | ICD-10-CM | POA: Diagnosis not present

## 2020-12-23 ENCOUNTER — Ambulatory Visit: Payer: Medicare Other | Admitting: Nurse Practitioner

## 2021-01-11 DIAGNOSIS — G4733 Obstructive sleep apnea (adult) (pediatric): Secondary | ICD-10-CM | POA: Diagnosis not present

## 2021-01-24 DIAGNOSIS — R300 Dysuria: Secondary | ICD-10-CM | POA: Diagnosis not present

## 2021-01-24 DIAGNOSIS — N390 Urinary tract infection, site not specified: Secondary | ICD-10-CM | POA: Diagnosis not present

## 2021-01-24 DIAGNOSIS — I10 Essential (primary) hypertension: Secondary | ICD-10-CM | POA: Diagnosis not present

## 2021-01-24 DIAGNOSIS — E119 Type 2 diabetes mellitus without complications: Secondary | ICD-10-CM | POA: Diagnosis not present

## 2021-02-11 DIAGNOSIS — G4733 Obstructive sleep apnea (adult) (pediatric): Secondary | ICD-10-CM | POA: Diagnosis not present

## 2021-03-08 ENCOUNTER — Encounter: Payer: Self-pay | Admitting: Gastroenterology

## 2021-03-08 ENCOUNTER — Telehealth: Payer: Self-pay | Admitting: *Deleted

## 2021-03-08 ENCOUNTER — Other Ambulatory Visit: Payer: Self-pay

## 2021-03-08 ENCOUNTER — Telehealth: Payer: Self-pay | Admitting: Gastroenterology

## 2021-03-08 ENCOUNTER — Telehealth (INDEPENDENT_AMBULATORY_CARE_PROVIDER_SITE_OTHER): Payer: Medicare Other | Admitting: Gastroenterology

## 2021-03-08 DIAGNOSIS — R945 Abnormal results of liver function studies: Secondary | ICD-10-CM

## 2021-03-08 DIAGNOSIS — K219 Gastro-esophageal reflux disease without esophagitis: Secondary | ICD-10-CM | POA: Diagnosis not present

## 2021-03-08 DIAGNOSIS — R7989 Other specified abnormal findings of blood chemistry: Secondary | ICD-10-CM

## 2021-03-08 DIAGNOSIS — R1319 Other dysphagia: Secondary | ICD-10-CM

## 2021-03-08 DIAGNOSIS — K2270 Barrett's esophagus without dysplasia: Secondary | ICD-10-CM | POA: Diagnosis not present

## 2021-03-08 DIAGNOSIS — Z86010 Personal history of colonic polyps: Secondary | ICD-10-CM | POA: Diagnosis not present

## 2021-03-08 MED ORDER — PANTOPRAZOLE SODIUM 40 MG PO TBEC: 40.0000 mg | DELAYED_RELEASE_TABLET | Freq: Two times a day (BID) | ORAL | 0 refills | Status: DC

## 2021-03-08 MED ORDER — PANTOPRAZOLE SODIUM 40 MG PO TBEC: 40.0000 mg | DELAYED_RELEASE_TABLET | Freq: Two times a day (BID) | ORAL | 3 refills | Status: DC

## 2021-03-08 NOTE — Telephone Encounter (Signed)
Please mail AVS from today's visit. Please schedule colonoscopy with Dr. Gala Romney with propofol. ASA III. Dx: h/o adenomatous colon polyps.  Day of prep: jardiance 1/2 tab daily and janumet 1/2 tablet bid.  Am of procedure: hold jardiance and janumet 3.   Please get copy of last lfts from pcp. 4.  I left message on answering machining for her to let us know which pharmacy to sent pantoprazole too, as pharmacy was not selected on today's visit. Please fax to her pharmacy of choice.

## 2021-03-08 NOTE — Patient Instructions (Addendum)
Stop omeprazole. Start pantoprazole 40mg  30 minutes before breakfast and 30 minutes before supper (or at bedtime).  I will review copy of labs from PCP and let you know if additional labs needed for liver. Try to limit Excedrin and Aleve. These can cause stomach irritation and ulcers.  Colonoscopy to be scheduled. See separate instructions.   Nonalcoholic Fatty Liver Disease Diet, Adult Nonalcoholic fatty liver disease is a condition that causes fat to build up in and around the liver. The disease makes it harder for the liver to work the way that it should. Following a healthy diet can help to keep nonalcoholic fatty liver disease under control. It can also help to prevent or improve conditions that are associated with the disease, such as heart disease, diabetes, highblood pressure, and abnormal cholesterol levels. Along with regular exercise, this diet: Promotes weight loss. Helps to control blood sugar levels. Helps to improve the way that the body uses insulin. What are tips for following this plan? Reading food labels Always check food labels for: The amount of saturated fat in a food. You should limit your intake of saturated fat. Saturated fat is found in foods that come from animals, including meat and dairy products such as butter, cheese, and whole milk. The amount of fiber in a food. You should choose high-fiber foods such as fruits, vegetables, and whole grains. Try to get 25-30 grams (g) of fiber a day.  Cooking When cooking, use heart-healthy oils that are high in monounsaturated fats. These include olive oil, canola oil, and avocado oil. Limit frying or deep-frying foods. Cook foods using healthy methods such as baking, boiling, steaming, and grilling instead. Meal planning You may want to keep track of how many calories you take in. Eating the right amount of calories will help you achieve a healthy weight. Meeting with a registered dietitian can help you get started. Limit how  often you eat takeout and fast food. These foods are usually very high in fat, salt, and sugar. Use the glycemic index (GI) to plan your meals. The index tells you how quickly a food will raise your blood sugar. Choose low-GI foods (GI less than 55). These foods take a longer time to raise blood sugar. A registered dietitian can help you identify foods lower on the GI scale. Lifestyle You may want to follow a Mediterranean diet. This diet includes a lot of vegetables, lean meats or fish, whole grains, fruits, and healthy oils and fats. What foods can I eat?  Fruits Bananas. Apples. Oranges. Grapes. Papaya. Mango. Pomegranate. Kiwi. Grapefruit.Cherries. Vegetables Lettuce. Spinach. Peas. Beets. Cauliflower. Cabbage. Broccoli. Carrots. Tomatoes. Squash. Eggplant. Herbs. Peppers. Onions. Cucumbers. Brusselssprouts. Yams and sweet potatoes. Beans. Lentils. Grains Whole wheat or whole-grain foods, including breads, crackers, cereals, and pasta. Stone-ground whole wheat. Unsweetened oatmeal. Bulgur. Barley. Quinoa.Brown or wild rice. Corn or whole wheat flour tortillas. Meats and other proteins Lean meats. Poultry. Tofu. Seafood and shellfish. Dairy Low-fat or fat-free dairy products, such as yogurt, cottage cheese, or cheese. Beverages Water. Sugar-free drinks. Tea. Coffee. Low-fat or skim milk. Milk alternatives,such as soy or almond milk. Real fruit juice. Fats and oils Avocado. Canola or olive oil. Nuts and nut butters. Seeds. Seasonings and condiments Mustard. Relish. Low-fat, low-sugar ketchup and barbecue sauce. Low-fat orfat-free mayonnaise. Sweets and desserts Sugar-free sweets. The items listed above may not be a complete list of foods and beverages you can eat. Contact a dietitian for more information. What foods should I limit or avoid? Meats and  other proteins Limit red meat to 1-2 times a week. Dairy [employer]. Fats and oils Palm oil and coconut oil. Fried foods. Other  foods Processed foods. Foods that contain a lot of salt or sodium. Sweets and desserts Sweets that contain sugar. Beverages Sweetened drinks, such as sweet tea, milkshakes, iced sweet drinks, and sodas.Alcohol. The items listed above may not be a complete list of foods and beverages you should avoid. Contact a dietitian for more information. Where to find more information The [employer] of Diabetes and Digestive and Kidney Diseases: [email or web address] Summary Nonalcoholic fatty liver disease is a condition that causes fat to build up in and around the liver. Following a healthy diet can help to keep nonalcoholic fatty liver disease under control. Your diet should be rich in fruits, vegetables, whole grains, and lean proteins. Limit your intake of saturated fat. Saturated fat is found in foods that come from animals, including meat and dairy products such as butter, cheese, and whole milk. This diet promotes weight loss, helps to control blood sugar levels, and helps to improve the way that the body uses insulin. This information is not intended to replace advice given to you by your health care provider. Make sure you discuss any questions you have with your healthcare provider. Document Revised: 12/20/2018 Document Reviewed: 09/19/2018 Elsevier Patient Education  2022 [employer].   Fatty Liver Disease  The liver converts food into energy, removes toxic material from the blood, makes important proteins, and absorbs necessary vitamins from food. Fatty liver disease occurs when too much fat has built up in your liver cells. Fatty liverdisease is also called hepatic steatosis. In many cases, fatty liver disease does not cause symptoms or problems. It is often diagnosed when tests are being done for other reasons. However, over time, fatty liver can cause inflammation that may lead to more serious liver problems, such as scarring of the liver (cirrhosis) and liver failure. Fatty liver is  associated with insulin resistance, increased body fat, high blood pressure (hypertension), and high cholesterol. These are features of metabolic syndrome and increaseyour risk for stroke, diabetes, and heart disease. What are the causes? This condition may be caused by components of metabolic syndrome: Obesity. Insulin resistance. High cholesterol. Other causes: Alcohol abuse. Poor nutrition. Cushing syndrome. Pregnancy. Certain drugs. Poisons. Some viral infections. What increases the risk? You are more likely to develop this condition if you: Abuse alcohol. Are overweight. Have diabetes. Have hepatitis. Have a high triglyceride level. Are pregnant. What are the signs or symptoms? Fatty liver disease often does not cause symptoms. If symptoms do develop, they can include: Fatigue and weakness. Weight loss. Confusion. Nausea, vomiting, or abdominal pain. Yellowing of your skin and the white parts of your eyes (jaundice). Itchy skin. How is this diagnosed? This condition may be diagnosed by: A physical exam and your medical history. Blood tests. Imaging tests, such as an ultrasound, CT scan, or MRI. A liver biopsy. A small sample of liver tissue is removed using a needle. The sample is then looked at under a microscope. How is this treated? Fatty liver disease is often caused by other health conditions. Treatment for fatty liver may involve medicines and lifestyle changes to manage conditions such as: Alcoholism. High cholesterol. Diabetes. Being overweight or obese. Follow these instructions at home:  Do not drink alcohol. If you have trouble quitting, ask your health care provider how to safely quit with the help of medicine or a supervised program. This is important to  keep your condition from getting worse. Eat a healthy diet as told by your health care provider. Ask your health care provider about working with a dietitian to develop an eating plan. Exercise  regularly. This can help you lose weight and control your cholesterol and diabetes. Talk to your health care provider about an exercise plan and which activities are best for you. Take over-the-counter and prescription medicines only as told by your health care provider. Keep all follow-up visits. This is important. Contact a health care provider if: You have trouble controlling your: Blood sugar. This is especially important if you have diabetes. Cholesterol. Drinking of alcohol. Get help right away if: You have abdominal pain. You have jaundice. You have nausea and are vomiting. You vomit blood or material that looks like coffee grounds. You have stools that are black, tar-like, or bloody. Summary Fatty liver disease develops when too much fat builds up in the cells of your liver. Fatty liver disease often causes no symptoms or problems. However, over time, fatty liver can cause inflammation that may lead to more serious liver problems, such as scarring of the liver (cirrhosis). You are more likely to develop this condition if you abuse alcohol, are pregnant, are overweight, have diabetes, have hepatitis, or have high triglyceride or cholesterol levels. Contact your health care provider if you have trouble controlling your blood sugar, cholesterol, or drinking of alcohol. This information is not intended to replace advice given to you by your health care provider. Make sure you discuss any questions you have with your healthcare provider. Document Revised: 06/10/2020 Document Reviewed: 06/10/2020 Elsevier Patient Education  [place].

## 2021-03-08 NOTE — Progress Notes (Signed)
Primary Care Physician:  Sharilyn Sites, MD Primary GI:  Garfield Cornea, MD   Patient Location: Home  Provider Location: Home  Reason for Visit:  Chief Complaint  Patient presents with   pp f/u    Heartburn; wants to know if med can be increased. Swallowing ok     Persons present on the virtual encounter, with roles: Patient, myself (provider),Martina Booth LPN (updated meds and allergies)  Total time (minutes) spent on medical discussion: 20 minutes  Due to COVID-19, visit was conducted using Mychart video method.  Visit was requested by patient.  Virtual Visit via Mychart video  I connected with Aimee Reed on 03/08/21 at  8:00 AM EDT by Mychart video and verified that I am speaking with the correct person using two identifiers.   I discussed the limitations, risks, security and privacy concerns of performing an evaluation and management service by telephone/video and the availability of in person appointments. I also discussed with the patient that there may be a patient responsible charge related to this service. The patient expressed understanding and agreed to proceed.   HPI:   Aimee Reed is a 65 y.o. female who presents for virtual visit regarding dysphagia and GERD. She has h/o Barrett's, fatty liver, adenomatous colon polyps due for surveillance colonoscopy at this time.   Swallowing much better s/p esophageal dilation. Having heartburn real bad sometimes. Worse with meals. Burping and epigastric tenderness. She uses Excedrin migraine and naprosyn as times. No n/v. BM alternate between no stool for few days and then will have diarrhea all day. Drinking coffee in morning and now more regular. No melena, brbpr. A lot of gas. No abdominal pain.  Not sure when last labs for liver were done. Does have regular labs with PCP. Previous work up in 2016 suggestive of fatty liver as source of abnormal LFTs. Last liver imaging in 2017 via CT with mild hepatic steatosis.    EGD 11/2020: normal diameter esophagus s/p dilation, small hiatal hernia, esophageal biopsy c/w barrett's. Surveillance EGD in 3 years.   Colonoscopy 02/2013: three colon polyps, the largest of which was 8 mm.  All polyps pedunculated.  Anal canal hemorrhoids and papilla noted.  Otherwise normal.  Surgical pathology found the polyps to be tubular adenoma.  Recommended repeat colonoscopy in 5 years (2019).  Current Outpatient Medications  Medication Sig Dispense Refill   acetaminophen (TYLENOL) 500 MG tablet Take 1,000 mg by mouth every 6 (six) hours as needed for moderate pain.     Apple Cider Vinegar 500 MG TABS Take 500 mg by mouth in the morning and at bedtime.     aspirin EC 81 MG tablet Take 81 mg by mouth at bedtime.     aspirin-acetaminophen-caffeine (EXCEDRIN MIGRAINE) 250-250-65 MG tablet Take 1 tablet by mouth every 6 (six) hours as needed for headache.     Biotin w/ Vitamins C & E (HAIR SKIN & NAILS GUMMIES PO) Take 3 capsules by mouth at bedtime.     Calcium Carbonate-Vitamin D (CALTRATE 600+D PO) Take 1 tablet by mouth daily.     Cinnamon 500 MG capsule Take 500 mg by mouth in the morning and at bedtime.     Cyanocobalamin (B-12) 5000 MCG CAPS Take 5,000 mcg by mouth daily.     diphenhydramine-acetaminophen (TYLENOL PM) 25-500 MG TABS tablet Take 1 tablet by mouth at bedtime as needed (sleep).     DULoxetine (CYMBALTA) 60 MG capsule Take 60 mg by mouth  daily.     JARDIANCE 25 MG TABS tablet TAKE 1 TABLET BY MOUTH  DAILY (Patient taking differently: Take 25 mg by mouth daily.) 90 tablet 0   loratadine (CLARITIN) 10 MG tablet Take 10 mg by mouth daily.     losartan (COZAAR) 100 MG tablet Take 100 mg by mouth daily.     naproxen sodium (ALEVE) 220 MG tablet Take 440 mg by mouth 2 (two) times daily as needed (pain).     omeprazole (PRILOSEC) 40 MG capsule Take 40 mg by mouth 2 (two) times daily.     OnabotulinumtoxinA (BOTOX IJ) Inject as directed. Every 4 months     oxybutynin  (DITROPAN) 5 MG tablet Take 5 mg by mouth 2 (two) times daily.     propranolol (INDERAL) 10 MG tablet Take 1-2 tablets (10-20 mg total) by mouth 2 (two) times daily. Takes 2 tablets (20mg ) in the mornings and 1 tablet (10mg ) at bedtime (Patient taking differently: Take 10-20 mg by mouth See admin instructions. Takes 2 tablets (20mg ) in the mornings and 1 tablet (10mg ) at bedtime) 90 tablet 3   rosuvastatin (CRESTOR) 5 MG tablet Take 5 mg by mouth every Saturday.     sitaGLIPtin-metformin (JANUMET) 50-1000 MG tablet Take 1 tablet by mouth 2 (two) times daily with a meal.     zolpidem (AMBIEN) 5 MG tablet 2.5- 5 mg at night as needed for sleep (Patient taking differently: Take 2.5-5 mg by mouth at bedtime as needed.) 90 tablet 0   No current facility-administered medications for this visit.    Past Medical History:  Diagnosis Date   Anxiety    Blepharospasm    Depression    Diabetes mellitus, type II ([place])    GERD (gastroesophageal reflux disease)    HTN (hypertension)    Hyperlipidemia 03/11/2012   Irritable bowel syndrome    Overactive bladder    Recurrent UTI    chronic macrodantin   Sleep apnea     Past Surgical History:  Procedure Laterality Date   BIOPSY  11/18/2020   Procedure: BIOPSY;  Surgeon: Daneil Dolin, MD;  Location: [facility];  Service: Endoscopy;;   CARPAL TUNNEL RELEASE     both hands   CHOLECYSTECTOMY     COLONOSCOPY     Dr. Arnoldo Morale, around 2010   COLONOSCOPY N/A 02/25/2013   [facility]:URKYHCW polyps, next tcs 02/2018 give tubular adenomas   ESOPHAGOGASTRODUODENOSCOPY  07/31/2011   [facility]:SEGBTD cervical esophageal web s/p dilation,query short segment Barrett's esophagus s/p bx (Barrett's esophagus without dysplasia),small hiatal hernia. 59 French Maloney dilator.   ESOPHAGOGASTRODUODENOSCOPY (EGD) WITH ESOPHAGEAL DILATION N/A 02/25/2013   VVO:HYWVPXTG distal esophagus/HH. next EGD 02/2016 given prior h/o Barrett's   ESOPHAGOGASTRODUODENOSCOPY (EGD) WITH PROPOFOL N/A  11/18/2020   Barrett's esophagus biopsy confirmed. no esophageal stricture s/p dilation for history of dysphagia, small hiatal hernia.   EYE SURGERY  07/13/2015   Eyelids, nerve issues   MALONEY DILATION N/A 11/18/2020   Procedure: MALONEY DILATION;  Surgeon: Daneil Dolin, MD;  Location: [facility];  Service: Endoscopy;  Laterality: N/A;    Family History  Problem Relation Age of Onset   Depression Father    Ulcers Mother    Alzheimer's disease Mother    Dementia Mother    Alzheimer's disease Maternal Grandmother    Dementia Maternal Grandmother    Diabetes Paternal Grandfather    Other Paternal Grandmother        died during childbirth   Other Brother  back problems   Cancer Sister        lung   Dementia Brother    Alzheimer's disease Brother    Colon cancer Neg Hx    Liver disease Neg Hx    ADD / ADHD Neg Hx    Alcohol abuse Neg Hx    Drug abuse Neg Hx    Anxiety disorder Neg Hx    OCD Neg Hx    Bipolar disorder Neg Hx    Paranoid behavior Neg Hx    Schizophrenia Neg Hx    Seizures Neg Hx    Sexual abuse Neg Hx    Physical abuse Neg Hx    Gastric cancer Neg Hx    Esophageal cancer Neg Hx     Social History   Socioeconomic History   Marital status: Married    Spouse name: Not on file   Number of children: 2   Years of education: Not on file   Highest education level: Not on file  Occupational History   Occupation: unemployed    Employer: UNEMPLOYED  Tobacco Use   Smoking status: Never   Smokeless tobacco: Never  Vaping Use   Vaping Use: Never used  Substance and Sexual Activity   Alcohol use: No   Drug use: No   Sexual activity: Not Currently    Birth control/protection: Post-menopausal  Other Topics Concern   Not on file  Social History Narrative   Not on file   Social Determinants of Health   Financial Resource Strain: Not on file  Food Insecurity: Not on file  Transportation Needs: Not on file  Physical Activity: Not on file   Stress: Not on file  Social Connections: Not on file  Intimate Partner Violence: Not on file      ROS:  General: Negative for anorexia, weight loss, fever, chills, fatigue, weakness. Eyes: Negative for vision changes.  ENT: Negative for hoarseness, difficulty swallowing , nasal congestion. CV: Negative for chest pain, angina, palpitations, dyspnea on exertion, peripheral edema.  Respiratory: Negative for dyspnea at rest, dyspnea on exertion, cough, sputum, wheezing.  GI: See history of present illness. GU:  Negative for dysuria, hematuria, urinary incontinence, urinary frequency, nocturnal urination.  MS: Negative for joint pain, low back pain.  Derm: Negative for rash or itching.  Neuro: Negative for weakness, abnormal sensation, seizure, frequent headaches, memory loss, confusion.  Psych: Negative for anxiety, depression, suicidal ideation, hallucinations.  Endo: Negative for unusual weight change.  Heme: Negative for bruising or bleeding. Allergy: Negative for rash or hives.   Observations/Objective:  Pleasant cooperative female in NAD. No SOB. Answered questions appropriately. Otherwise exam unavailable.   Assessment and Plan:  Chronic GERD/dysphagia: complicated by Barrett's esophagus without dysplasia. Recent EGD/ED with marked improvement in swallowing. She continues to have breakthrough heartburn and epigastric tenderness on regular basis which may be more related to poorly controlled gerd. Will switch her from omeprazole to pantoprazole 40mg  BID 30 minutes before a meal. Would avoid Execedrin migraine and naprosyn as much as possible. If persistent symptoms she will let me know. Surveillance EGD in 3 years for Barrett's.   H/O colonic adenomas: overdue for surveillance colonoscopy. Plan for one in near future with propofol given polypharmacy. ASA III.  I have discussed the risks, alternatives, benefits with regards to but not limited to the risk of reaction to medication,  bleeding, infection, perforation and the patient is agreeable to proceed. Written consent to be obtained.  Fatty liver/abnormal LFTS: will obtain copy of  most recent labs from PCP. She needs to have these monitored at least twice yearly. Previous work up most c/w NASH. Discussed fatty liver and lifestyle modifications. Hand out provided.   Follow Up Instructions:    I discussed the assessment and treatment plan with the patient. The patient was provided an opportunity to ask questions and all were answered. The patient agreed with the plan and demonstrated an understanding of the instructions. AVS mailed to patient's home address.   The patient was advised to call back or seek an in-person evaluation if the symptoms worsen or if the condition fails to improve as anticipated.  I provided 25 minutes of virtual face-to-face time during this encounter.   Neil Crouch, PA-C

## 2021-03-08 NOTE — Telephone Encounter (Signed)
Micheal Likens, you are scheduled for a virtual visit with your provider today.  Just as we do with appointments in the office, we must obtain your consent to participate.  Your consent will be active for this visit and any virtual visit you may have with one of our providers in the next 365 days.  If you have a MyChart account, I can also send a copy of this consent to you electronically.  All virtual visits are billed to your insurance company just like a traditional visit in the office.  As this is a virtual visit, video technology does not allow for your provider to perform a traditional examination.  This may limit your provider's ability to fully assess your condition.  If your provider identifies any concerns that need to be evaluated in person or the need to arrange testing such as labs, EKG, etc, we will make arrangements to do so.  Although advances in technology are sophisticated, we cannot ensure that it will always work on either your end or our end.  If the connection with a video visit is poor, we may have to switch to a telephone visit.  With either a video or telephone visit, we are not always able to ensure that we have a secure connection.   I need to obtain your verbal consent now.   Are you willing to proceed with your visit today?

## 2021-03-08 NOTE — Telephone Encounter (Signed)
AVS mailed

## 2021-03-08 NOTE — Telephone Encounter (Signed)
LMOVM to call back to schedule 

## 2021-03-08 NOTE — Addendum Note (Signed)
Addended by: Mahala Menghini on: 03/08/2021 04:49 PM   Modules accepted: Orders

## 2021-03-08 NOTE — Telephone Encounter (Signed)
Patient returned my call about which pharmacy to send pantoprazole to. Please shred the rx that printed at the office. I am going to send in 30 day supply to [facility] and if she finds helpful then we will send to her mail order.

## 2021-03-08 NOTE — Telephone Encounter (Signed)
Pt consented to a virtual visit. 

## 2021-03-09 ENCOUNTER — Encounter: Payer: Self-pay | Admitting: *Deleted

## 2021-03-09 MED ORDER — CLENPIQ 10-3.5-12 MG-GM -GM/160ML PO SOLN: 1.0000 | Freq: Once | ORAL | 0 refills | Status: AC

## 2021-03-09 NOTE — Telephone Encounter (Signed)
Called pt. She has been scheduled for 8/25 at 7:30am. Aware will mail prep instructions with pre-op appt. Confirmed pharmacy. Confirmed address.

## 2021-03-09 NOTE — Telephone Encounter (Signed)
noted 

## 2021-03-09 NOTE — Addendum Note (Signed)
Addended by: Cheron Every on: 03/09/2021 08:44 AM   Modules accepted: Orders

## 2021-03-09 NOTE — Telephone Encounter (Signed)
PA approved via [facility]. Auth# [id number], DOS May 05, 2021 - Aug 03, 2021

## 2021-03-09 NOTE — Telephone Encounter (Signed)
LFTs requested from PCP

## 2021-03-13 DIAGNOSIS — G4733 Obstructive sleep apnea (adult) (pediatric): Secondary | ICD-10-CM | POA: Diagnosis not present

## 2021-03-20 ENCOUNTER — Telehealth: Payer: Self-pay | Admitting: Gastroenterology

## 2021-03-20 NOTE — Telephone Encounter (Signed)
Labs from 10/2020 PCP  WBC 7200, H/H 13.7/41.1, Platelets 179000 Creatinine 0.60, Tbili 0.4, AP 61, AST 31, ALT 50H A1C 6.4  ALT minimally elevated. Known fatty liver.    Recommend repeat LFTs in six months.  Instructions for fatty liver: Recommend 1-2# weight loss per week until ideal body weight through exercise & diet. Low fat/cholesterol diet.   Avoid sweets, sodas, fruit juices, sweetened beverages like tea, etc. Gradually increase exercise from 15 min daily up to 1 hr per day 5 days/week. Limit alcohol use.

## 2021-03-23 NOTE — Telephone Encounter (Signed)
LMOM for pt. To call office

## 2021-03-24 NOTE — Telephone Encounter (Signed)
Spoke to pt. Informed her of recommendations. Informed her that provider wants labs done in 6 months. Pt. Voiced that she understood.

## 2021-04-19 DIAGNOSIS — H25813 Combined forms of age-related cataract, bilateral: Secondary | ICD-10-CM | POA: Diagnosis not present

## 2021-04-19 DIAGNOSIS — G245 Blepharospasm: Secondary | ICD-10-CM | POA: Diagnosis not present

## 2021-05-02 DIAGNOSIS — E119 Type 2 diabetes mellitus without complications: Secondary | ICD-10-CM | POA: Diagnosis not present

## 2021-05-02 DIAGNOSIS — I10 Essential (primary) hypertension: Secondary | ICD-10-CM | POA: Diagnosis not present

## 2021-05-02 NOTE — Patient Instructions (Signed)
Aimee Reed  05/02/2021     '@PREFPERIOPPHARMACY'$ @   Your procedure is scheduled on  05/05/2021.   Report to [facility] at  [phone] A.M.   Call this number if you have problems the morning of surgery:  [phone]   Remember:  Follow the diet and prep instructions given to you by the office.    Take these medicines the morning of surgery with A SIP OF WATER       cymbalta, claritin, prilosec, ditropan, propranolol.     Do not wear jewelry, make-up or nail polish.  Do not wear lotions, powders, or perfumes, or deodorant.  Do not shave 48 hours prior to surgery.  Men may shave face and neck.  Do not bring valuables to the hospital.  [facility] is not responsible for any belongings or valuables.  Contacts, dentures or bridgework may not be worn into surgery.  Leave your suitcase in the car.  After surgery it may be brought to your room.  For patients admitted to the hospital, discharge time will be determined by your treatment team.  Patients discharged the day of surgery will not be allowed to drive home and must have someone with them for 24 hours.    Special instructions:   DO NOT smoke tobacco or vape for 24 hours before your procedure.  Please read over the following fact sheets that you were given. Anesthesia Post-op Instructions and Care and Recovery After Surgery      Colonoscopy, Adult, Care After This sheet gives you information about how to care for yourself after your procedure. Your health care provider may also give you more specific instructions. If you have problems or questions, contact your health careprovider. What can I expect after the procedure? After the procedure, it is common to have: A small amount of blood in your stool for 24 hours after the procedure. Some gas. Mild cramping or bloating of your abdomen. Follow these instructions at home: Eating and drinking  Drink enough fluid to keep your urine pale yellow. Follow instructions  from your health care provider about eating or drinking restrictions. Resume your normal diet as instructed by your health care provider. Avoid heavy or fried foods that are hard to digest.  Activity Rest as told by your health care provider. Avoid sitting for a long time without moving. Get up to take short walks every 1-2 hours. This is important to improve blood flow and breathing. Ask for help if you feel weak or unsteady. Return to your normal activities as told by your health care provider. Ask your health care provider what activities are safe for you. Managing cramping and bloating  Try walking around when you have cramps or feel bloated. Apply heat to your abdomen as told by your health care provider. Use the heat source that your health care provider recommends, such as a moist heat pack or a heating pad. Place a towel between your skin and the heat source. Leave the heat on for 20-30 minutes. Remove the heat if your skin turns bright red. This is especially important if you are unable to feel pain, heat, or cold. You may have a greater risk of getting burned.  General instructions If you were given a sedative during the procedure, it can affect you for several hours. Do not drive or operate machinery until your health care provider says that it is safe. For the first 24 hours after the procedure: Do not sign  important documents. Do not drink alcohol. Do your regular daily activities at a slower pace than normal. Eat soft foods that are easy to digest. Take over-the-counter and prescription medicines only as told by your health care provider. Keep all follow-up visits as told by your health care provider. This is important. Contact a health care provider if: You have blood in your stool 2-3 days after the procedure. Get help right away if you have: More than a small spotting of blood in your stool. Large blood clots in your stool. Swelling of your abdomen. Nausea or  vomiting. A fever. Increasing pain in your abdomen that is not relieved with medicine. Summary After the procedure, it is common to have a small amount of blood in your stool. You may also have mild cramping and bloating of your abdomen. If you were given a sedative during the procedure, it can affect you for several hours. Do not drive or operate machinery until your health care provider says that it is safe. Get help right away if you have a lot of blood in your stool, nausea or vomiting, a fever, or increased pain in your abdomen. This information is not intended to replace advice given to you by your health care provider. Make sure you discuss any questions you have with your healthcare provider. Document Revised: 08/22/2019 Document Reviewed: 03/24/2019 Elsevier Patient Education  [place] After This sheet gives you information about how to care for yourself after your procedure. Your health care provider may also give you more specific instructions. If you have problems or questions, contact your health careprovider. What can I expect after the procedure? After the procedure, it is common to have: Tiredness. Forgetfulness about what happened after the procedure. Impaired judgment for important decisions. Nausea or vomiting. Some difficulty with balance. Follow these instructions at home: For the time period you were told by your health care provider:     Rest as needed. Do not participate in activities where you could fall or become injured. Do not drive or use machinery. Do not drink alcohol. Do not take sleeping pills or medicines that cause drowsiness. Do not make important decisions or sign legal documents. Do not take care of children on your own. Eating and drinking Follow the diet that is recommended by your health care provider. Drink enough fluid to keep your urine pale yellow. If you vomit: Drink water, juice, or soup when  you can drink without vomiting. Make sure you have little or no nausea before eating solid foods. General instructions Have a responsible adult stay with you for the time you are told. It is important to have someone help care for you until you are awake and alert. Take over-the-counter and prescription medicines only as told by your health care provider. If you have sleep apnea, surgery and certain medicines can increase your risk for breathing problems. Follow instructions from your health care provider about wearing your sleep device: Anytime you are sleeping, including during daytime naps. While taking prescription pain medicines, sleeping medicines, or medicines that make you drowsy. Avoid smoking. Keep all follow-up visits as told by your health care provider. This is important. Contact a health care provider if: You keep feeling nauseous or you keep vomiting. You feel light-headed. You are still sleepy or having trouble with balance after 24 hours. You develop a rash. You have a fever. You have redness or swelling around the IV site. Get help right away if: You have  trouble breathing. You have new-onset confusion at home. Summary For several hours after your procedure, you may feel tired. You may also be forgetful and have poor judgment. Have a responsible adult stay with you for the time you are told. It is important to have someone help care for you until you are awake and alert. Rest as told. Do not drive or operate machinery. Do not drink alcohol or take sleeping pills. Get help right away if you have trouble breathing, or if you suddenly become confused. This information is not intended to replace advice given to you by your health care provider. Make sure you discuss any questions you have with your healthcare provider. Document Revised: 05/13/2020 Document Reviewed: 07/31/2019 Elsevier Patient Education  2022 [employer].

## 2021-05-03 ENCOUNTER — Other Ambulatory Visit: Payer: Self-pay

## 2021-05-03 ENCOUNTER — Encounter (HOSPITAL_COMMUNITY)
Admission: RE | Admit: 2021-05-03 | Discharge: 2021-05-03 | Disposition: A | Discharge status: Home / Self Care | Payer: Medicare Other | Source: Ambulatory Visit | Attending: Internal Medicine | Admitting: Internal Medicine

## 2021-05-03 DIAGNOSIS — Z01818 Encounter for other preprocedural examination: Secondary | ICD-10-CM | POA: Insufficient documentation

## 2021-05-03 LAB — BASIC METABOLIC PANEL
Anion gap: 7 (ref 5–15)
BUN: 10 mg/dL (ref 8–23)
CO2: 27 mmol/L (ref 22–32)
Calcium: 9 mg/dL (ref 8.9–10.3)
Chloride: 105 mmol/L (ref 98–111)
Creatinine, Ser: 0.59 mg/dL (ref 0.44–1.00)
GFR, Estimated: >60 mL/min (ref >60)
Glucose, Bld: 139 mg/dL — ABNORMAL HIGH (ref 70–99)
Potassium: 4.1 mmol/L (ref 3.5–5.1)
Sodium: 139 mmol/L (ref 135–145)

## 2021-05-05 ENCOUNTER — Ambulatory Visit (HOSPITAL_COMMUNITY): Payer: Medicare Other | Admitting: Anesthesiology

## 2021-05-05 ENCOUNTER — Encounter (HOSPITAL_COMMUNITY)
Admission: RE | Disposition: A | Discharge status: Home / Self Care | Payer: Self-pay | Source: Home / Self Care | Attending: Internal Medicine

## 2021-05-05 ENCOUNTER — Encounter (HOSPITAL_COMMUNITY): Payer: Self-pay | Admitting: Internal Medicine

## 2021-05-05 ENCOUNTER — Ambulatory Visit (HOSPITAL_COMMUNITY)
Admission: RE | Admit: 2021-05-05 | Discharge: 2021-05-05 | Disposition: A | Discharge status: Home / Self Care | Payer: Medicare Other | Attending: Internal Medicine | Admitting: Internal Medicine

## 2021-05-05 DIAGNOSIS — Z86010 Personal history of colonic polyps: Secondary | ICD-10-CM

## 2021-05-05 DIAGNOSIS — D123 Benign neoplasm of transverse colon: Secondary | ICD-10-CM | POA: Insufficient documentation

## 2021-05-05 DIAGNOSIS — Q438 Other specified congenital malformations of intestine: Secondary | ICD-10-CM | POA: Diagnosis not present

## 2021-05-05 DIAGNOSIS — Z7982 Long term (current) use of aspirin: Secondary | ICD-10-CM | POA: Diagnosis not present

## 2021-05-05 DIAGNOSIS — Z79899 Other long term (current) drug therapy: Secondary | ICD-10-CM | POA: Diagnosis not present

## 2021-05-05 DIAGNOSIS — Z7984 Long term (current) use of oral hypoglycemic drugs: Secondary | ICD-10-CM | POA: Insufficient documentation

## 2021-05-05 DIAGNOSIS — Z1211 Encounter for screening for malignant neoplasm of colon: Secondary | ICD-10-CM | POA: Diagnosis not present

## 2021-05-05 DIAGNOSIS — D122 Benign neoplasm of ascending colon: Secondary | ICD-10-CM | POA: Diagnosis not present

## 2021-05-05 DIAGNOSIS — E119 Type 2 diabetes mellitus without complications: Secondary | ICD-10-CM | POA: Diagnosis not present

## 2021-05-05 DIAGNOSIS — K635 Polyp of colon: Secondary | ICD-10-CM | POA: Diagnosis not present

## 2021-05-05 HISTORY — PX: POLYPECTOMY: SHX5525

## 2021-05-05 HISTORY — PX: COLONOSCOPY WITH PROPOFOL: SHX5780

## 2021-05-05 LAB — GLUCOSE, CAPILLARY: Glucose-Capillary: 137 mg/dL — ABNORMAL HIGH (ref 70–99)

## 2021-05-05 SURGERY — COLONOSCOPY WITH PROPOFOL
Anesthesia: General

## 2021-05-05 MED ORDER — STERILE WATER FOR IRRIGATION IR SOLN
Status: DC | PRN
  Administered 2021-05-05: 100 mL

## 2021-05-05 MED ORDER — PROPOFOL 10 MG/ML IV BOLUS
INTRAVENOUS | Status: AC
  Filled 2021-05-05: qty 40

## 2021-05-05 MED ORDER — PROPOFOL 10 MG/ML IV BOLUS
INTRAVENOUS | Status: AC
  Filled 2021-05-05: qty 60

## 2021-05-05 MED ORDER — PROPOFOL 10 MG/ML IV BOLUS
INTRAVENOUS | Status: DC | PRN
Start: 2021-05-05 — End: 2021-05-05
  Administered 2021-05-05 (×2): 50 mg via INTRAVENOUS
  Administered 2021-05-05: 20 mg via INTRAVENOUS
  Administered 2021-05-05 (×5): 50 mg via INTRAVENOUS
  Administered 2021-05-05: 20 mg via INTRAVENOUS
  Administered 2021-05-05 (×2): 50 mg via INTRAVENOUS
  Administered 2021-05-05: 30 mg via INTRAVENOUS

## 2021-05-05 MED ORDER — PROPOFOL 500 MG/50ML IV EMUL
INTRAVENOUS | Status: DC | PRN
  Administered 2021-05-05: 150 ug/kg/min via INTRAVENOUS

## 2021-05-05 MED ORDER — LACTATED RINGERS IV SOLN: INTRAVENOUS | Status: DC

## 2021-05-05 NOTE — H&P (Signed)
$'@LOGO'E$ @   Primary Care Physician:  Sharilyn Sites, MD Primary Gastroenterologist:  Dr.   Pre-Procedure History & Physical: HPI:  Aimee Reed is a 65 y.o. female here for   Past Medical History:  Diagnosis Date   Anxiety    Blepharospasm    Depression    Diabetes mellitus, type II ([place])    GERD (gastroesophageal reflux disease)    HTN (hypertension)    Hyperlipidemia 03/11/2012   Irritable bowel syndrome    Overactive bladder    Recurrent UTI    chronic macrodantin   Sleep apnea     Past Surgical History:  Procedure Laterality Date   BIOPSY  11/18/2020   Procedure: BIOPSY;  Surgeon: Daneil Dolin, MD;  Location: [facility];  Service: Endoscopy;;   CARPAL TUNNEL RELEASE     both hands   CHOLECYSTECTOMY     COLONOSCOPY     Dr. Arnoldo Morale, around 2010   COLONOSCOPY N/A 02/25/2013   [id number] polyps, next tcs 02/2018 give tubular adenomas   ESOPHAGOGASTRODUODENOSCOPY  07/31/2011   [id number] cervical esophageal web s/p dilation,query short segment Barrett's esophagus s/p bx (Barrett's esophagus without dysplasia),small hiatal hernia. 58 French Maloney dilator.   ESOPHAGOGASTRODUODENOSCOPY (EGD) WITH ESOPHAGEAL DILATION N/A 02/25/2013   [id number] distal esophagus/HH. next EGD 02/2016 given prior h/o Barrett's   ESOPHAGOGASTRODUODENOSCOPY (EGD) WITH PROPOFOL N/A 11/18/2020   Barrett's esophagus biopsy confirmed. no esophageal stricture s/p dilation for history of dysphagia, small hiatal hernia.   EYE SURGERY  07/13/2015   Eyelids, nerve issues   MALONEY DILATION N/A 11/18/2020   Procedure: MALONEY DILATION;  Surgeon: Daneil Dolin, MD;  Location: [facility];  Service: Endoscopy;  Laterality: N/A;    Prior to Admission medications   Medication Sig Start Date End Date Taking? Authorizing Provider  acetaminophen (TYLENOL) 500 MG tablet Take 1,000 mg by mouth every 6 (six) hours as needed for moderate pain.   Yes [provider]  Apple Cider Vinegar 500 MG  TABS Take 500 mg by mouth 3 (three) times daily.   Yes [provider]  aspirin EC 81 MG tablet Take 81 mg by mouth at bedtime.   Yes [provider]  aspirin-acetaminophen-caffeine (EXCEDRIN MIGRAINE) [id number] MG tablet Take 1 tablet by mouth every 6 (six) hours as needed for headache.   Yes [provider]  Calcium Carbonate-Vitamin D (CALTRATE 600+D PO) Take 1 tablet by mouth daily.   Yes [provider]  Carboxymethylcellulose Sodium (THERATEARS) 0.25 % SOLN Place 1 drop into both eyes daily.   Yes [provider]  CINNAMON PO Take 1,200 mg by mouth in the morning and at bedtime.   Yes [provider]  Cyanocobalamin (B-12) 2500 MCG TABS Take 2,500 mcg by mouth daily.   Yes [provider]  diphenhydramine-acetaminophen (TYLENOL PM) 25-500 MG TABS tablet Take 1 tablet by mouth at bedtime as needed (sleep).   Yes [provider]  DULoxetine (CYMBALTA) 60 MG capsule Take 60 mg by mouth daily.   Yes [provider]  JARDIANCE 25 MG TABS tablet TAKE 1 TABLET BY MOUTH  DAILY Patient taking differently: Take 25 mg by mouth daily. 11/24/16  Yes Nida, Marella Chimes, MD  loratadine (CLARITIN) 10 MG tablet Take 10 mg by mouth daily.   Yes [provider]  losartan (COZAAR) 100 MG tablet Take 100 mg by mouth daily.   Yes [provider]  naproxen sodium (ALEVE) 220 MG tablet Take 440 mg by mouth 2 (  two) times daily as needed (pain).   Yes [provider]  omeprazole (PRILOSEC) 40 MG capsule Take 40 mg by mouth 2 (two) times daily. 04/21/21  Yes [provider]  oxybutynin (DITROPAN) 5 MG tablet Take 5 mg by mouth 2 (two) times daily.   Yes [provider]  pantoprazole (PROTONIX) 40 MG tablet Take 1 tablet (40 mg total) by mouth 2 (two) times daily before a meal. 03/08/21  Yes Mahala Menghini, PA-C  propranolol (INDERAL) 10 MG tablet Take 1-2 tablets (10-20 mg total) by mouth 2  (two) times daily. Takes 2 tablets ('20mg'$ ) in the mornings and 1 tablet ('10mg'$ ) at bedtime Patient taking differently: Take 10-20 mg by mouth See admin instructions. Takes 2 tablets ('20mg'$ ) in the mornings and 1 tablet ('10mg'$ ) at bedtime 02/24/13  Yes Darrol Jump, MD  rosuvastatin (CRESTOR) 5 MG tablet Take 5 mg by mouth every Saturday.   Yes [provider]  sitaGLIPtin-metformin (JANUMET) 50-1000 MG tablet Take 1 tablet by mouth 2 (two) times daily with a meal.   Yes [provider]  zolpidem (AMBIEN) 5 MG tablet 2.5- 5 mg at night as needed for sleep Patient taking differently: Take 2.5-5 mg by mouth at bedtime as needed. 07/25/18  Yes Norman Clay, MD  OnabotulinumtoxinA (BOTOX IJ) Inject as directed. Every 3 months 6 injection around each eye    [provider]    Allergies as of 03/09/2021   (No Known Allergies)    Family History  Problem Relation Age of Onset   Depression Father    Ulcers Mother    Alzheimer's disease Mother    Dementia Mother    Alzheimer's disease Maternal Grandmother    Dementia Maternal Grandmother    Diabetes Paternal Grandfather    Other Paternal Grandmother        died during childbirth   Other Brother        back problems   Cancer Sister        lung   Dementia Brother    Alzheimer's disease Brother    Colon cancer Neg Hx    Liver disease Neg Hx    ADD / ADHD Neg Hx    Alcohol abuse Neg Hx    Drug abuse Neg Hx    Anxiety disorder Neg Hx    OCD Neg Hx    Bipolar disorder Neg Hx    Paranoid behavior Neg Hx    Schizophrenia Neg Hx    Seizures Neg Hx    Sexual abuse Neg Hx    Physical abuse Neg Hx    Gastric cancer Neg Hx    Esophageal cancer Neg Hx     Social History   Socioeconomic History   Marital status: Married    Spouse name: Not on file   Number of children: 2   Years of education: Not on file   Highest education level: Not on file  Occupational History   Occupation: unemployed    Employer:  UNEMPLOYED  Tobacco Use   Smoking status: Never   Smokeless tobacco: Never  Vaping Use   Vaping Use: Never used  Substance and Sexual Activity   Alcohol use: No   Drug use: No   Sexual activity: Not Currently    Birth control/protection: Post-menopausal  Other Topics Concern   Not on file  Social History Narrative   Not on file   Social Determinants of Health   Financial Resource Strain: Not on file  Food Insecurity:  Not on file  Transportation Needs: Not on file  Physical Activity: Not on file  Stress: Not on file  Social Connections: Not on file  Intimate Partner Violence: Not on file    Review of Systems: See HPI, otherwise negative ROS  Physical Exam: BP (!) 162/67   Pulse 76   Temp 99 F (37.2 C) (Oral)   Resp 18   SpO2 94%  General:   Alert,  Well-developed, well-nourished, pleasant and cooperative in NAD Neck:  Supple; no masses or thyromegaly. No significant cervical adenopathy. Lungs:  Clear throughout to auscultation.   No wheezes, crackles, or rhonchi. No acute distress. Heart:  Regular rate and rhythm; no murmurs, clicks, rubs,  or gallops. Abdomen: Non-distended, normal bowel sounds.  Soft and nontender without appreciable mass or hepatosplenomegaly.  Pulses:  Normal pulses noted. Extremities:  Without clubbing or edema.  Impression/Plan: 65 year old lady with a history of colonic adenomas here for surveillance colonoscopy per plan. The risks, benefits, limitations, alternatives and imponderables have been reviewed with the patient. Questions have been answered. All parties are agreeable.       Notice: This dictation was prepared with Dragon dictation along with smaller phrase technology. Any transcriptional errors that result from this process are unintentional and may not be corrected upon review.

## 2021-05-05 NOTE — Op Note (Signed)
[facility] Patient Name: Aimee Reed Procedure Date: 05/05/2021 7:06 AM MRN: [id number] Date of Birth: December 05, 1955 Attending MD: Norvel Richards , MD CSN: [id number] Age: 65 Admit Type: Outpatient Procedure:                Colonoscopy Indications:              High risk colon cancer surveillance: Personal                            history of colonic polyps Providers:                Norvel Richards, MD, Caprice Kluver, Raphael Gibney, Technician Referring MD:              Medicines:                Propofol per Anesthesia Complications:            No immediate complications. Estimated Blood Loss:     Estimated blood loss was minimal. Procedure:                Pre-Anesthesia Assessment:                           - Prior to the procedure, a History and Physical                            was performed, and patient medications and                            allergies were reviewed. The patient's tolerance of                            previous anesthesia was also reviewed. The risks                            and benefits of the procedure and the sedation                            options and risks were discussed with the patient.                            All questions were answered, and informed consent                            was obtained. Prior Anticoagulants: The patient has                            taken no previous anticoagulant or antiplatelet                            agents. ASA Grade Assessment: III - A patient with  severe systemic disease. After reviewing the risks                            and benefits, the patient was deemed in                            satisfactory condition to undergo the procedure.                           After obtaining informed consent, the colonoscope                            was passed under direct vision. Throughout the                            procedure, the  patient's blood pressure, pulse, and                            oxygen saturations were monitored continuously. The                            [id number]) scope was introduced through                            the anus and advanced to the the cecum, identified                            by appendiceal orifice and ileocecal valve. The                            colonoscopy was performed without difficulty. The                            patient tolerated the procedure well. The quality                            of the bowel preparation was adequate. Scope In: 7:42:37 AM Scope Out: 8:22:47 AM Scope Withdrawal Time: 0 hours 25 minutes 27 seconds  Total Procedure Duration: 0 hours 40 minutes 10 seconds  Findings:      The perianal and digital rectal examinations were normal. Markedly       redundant and elongated colon requiring changing of the patient's       position and external abdominal pressure to reach the cecum      Eight semi-pedunculated polyps were found in the splenic flexure and       ascending colon. The polyps were 7 to 9 mm in size. These polyps were       removed with a cold snare. Resection and retrieval were complete.       Estimated blood loss was minimal. Impression:               - Eight 7 to 9 mm polyps at the splenic flexure and  in the ascending colon, removed with a cold snare.                            Resected and retrieved. Internal hemorrhoids.                            Redundant colon. Moderate Sedation:      Moderate (conscious) sedation was administered by the endoscopy nurse       and supervised by the endoscopist. The following parameters were       monitored: oxygen saturation, heart rate, blood pressure, respiratory       rate, EKG, adequacy of pulmonary ventilation, and response to care. Recommendation:           - Patient has a contact number available for                            emergencies. The signs and  symptoms of potential                            delayed complications were discussed with the                            patient. Return to normal activities tomorrow.                            Written discharge instructions were provided to the                            patient.                           - Advance diet as tolerated.                           - Patient has a contact number available for                            emergencies. The signs and symptoms of potential                            delayed complications were discussed with the                            patient. Return to normal activities tomorrow.                            Written discharge instructions were provided to the                            patient.                           Follow-up on pathology. Repeat colonoscopy based on  pathology report. Procedure Code(s):        --- Professional ---                           [phone], Colonoscopy, flexible; with removal of                            tumor(s), polyp(s), or other lesion(s) by snare                            technique Diagnosis Code(s):        --- Professional ---                           Z86.010, Personal history of colonic polyps                           K63.5, Polyp of colon CPT copyright 2019 American Medical Association. All rights reserved. The codes documented in this report are preliminary and upon coder review may  be revised to meet current compliance requirements. [name]. Lenorris Karger, MD Norvel Richards, MD 05/05/2021 8:46:44 AM This report has been signed electronically. Number of Addenda: 0

## 2021-05-05 NOTE — Transfer of Care (Signed)
Immediate Anesthesia Transfer of Care Note  Patient: Aimee Reed  Procedure(s) Performed: COLONOSCOPY WITH PROPOFOL POLYPECTOMY  Patient Location: Short Stay  Anesthesia Type:General  Level of Consciousness: awake  Airway & Oxygen Therapy: Patient Spontanous Breathing  Post-op Assessment: Report given to RN  Post vital signs: Reviewed and stable  Last Vitals:  Vitals Value Taken Time  BP 134/63 05/05/21 0827  Temp 36.6 C 05/05/21 0827  Pulse 81 05/05/21 0827  Resp 17 05/05/21 0827  SpO2 100 % 05/05/21 0827    Last Pain:  Vitals:   05/05/21 0827  TempSrc: Oral  PainSc: 0-No pain      Patients Stated Pain Goal: 6 ([id number])  Complications: No notable events documented.

## 2021-05-05 NOTE — Discharge Instructions (Signed)
  Colonoscopy Discharge Instructions  Read the instructions outlined below and refer to this sheet in the next few weeks. These discharge instructions provide you with general information on caring for yourself after you leave the hospital. Your doctor may also give you specific instructions. While your treatment has been planned according to the most current medical practices available, unavoidable complications occasionally occur. If you have any problems or questions after discharge, call Dr. Gala Romney at [phone]. ACTIVITY You may resume your regular activity, but move at a slower pace for the next 24 hours.  Take frequent rest periods for the next 24 hours.  Walking will help get rid of the air and reduce the bloated feeling in your belly (abdomen).  No driving for 24 hours (because of the medicine (anesthesia) used during the test).   Do not sign any important legal documents or operate any machinery for 24 hours (because of the anesthesia used during the test).  NUTRITION Drink plenty of fluids.  You may resume your normal diet as instructed by your doctor.  Begin with a light meal and progress to your normal diet. Heavy or fried foods are harder to digest and may make you feel sick to your stomach (nauseated).  Avoid alcoholic beverages for 24 hours or as instructed.  MEDICATIONS You may resume your normal medications unless your doctor tells you otherwise.  WHAT YOU CAN EXPECT TODAY Some feelings of bloating in the abdomen.  Passage of more gas than usual.  Spotting of blood in your stool or on the toilet paper.  IF YOU HAD POLYPS REMOVED DURING THE COLONOSCOPY: No aspirin products for 7 days or as instructed.  No alcohol for 7 days or as instructed.  Eat a soft diet for the next 24 hours.  FINDING OUT THE RESULTS OF YOUR TEST Not all test results are available during your visit. If your test results are not back during the visit, make an appointment with your caregiver to find out the  results. Do not assume everything is normal if you have not heard from your caregiver or the medical facility. It is important for you to follow up on all of your test results.  SEEK IMMEDIATE MEDICAL ATTENTION IF: You have more than a spotting of blood in your stool.  Your belly is swollen (abdominal distention).  You are nauseated or vomiting.  You have a temperature over 101.  You have abdominal pain or discomfort that is severe or gets worse throughout the day.    8 polyps removed in your colon today  Further recommendations to follow pending review of pathology report  At patient request, I called [facility] Challenger and discussed findings and recommendations

## 2021-05-05 NOTE — Anesthesia Preprocedure Evaluation (Signed)
Anesthesia Evaluation  Patient identified by MRN, date of birth, ID band Patient awake    Reviewed: Allergy & Precautions, NPO status , Patient's Chart, lab work & pertinent test results, reviewed documented beta blocker date and time   History of Anesthesia Complications Negative for: history of anesthetic complications  Airway Mallampati: II  TM Distance: >3 FB Neck ROM: Full    Dental  (+) Dental Advisory Given, Chipped   Pulmonary sleep apnea and Continuous Positive Airway Pressure Ventilation ,    Pulmonary exam normal breath sounds clear to auscultation       Cardiovascular Exercise Tolerance: Good hypertension, Pt. on medications and Pt. on home beta blockers Normal cardiovascular exam Rhythm:Regular Rate:Normal  03-May-2021 12:54:08 [facility] ROUTINE RECORD [birth year] (81 yr) Female Caucasian Vent. rate 69 BPM PR interval 152 ms QRS duration 88 ms QT/QTcB 414/443 ms P-R-T axes 63 79 14 Normal sinus rhythm Cannot rule out Inferior infarct , age undetermined Abnormal ECG Confirmed by Asencion Noble [phone]) on 05/03/2021 8:27:26 PM   Neuro/Psych PSYCHIATRIC DISORDERS Anxiety Depression  Neuromuscular disease    GI/Hepatic Neg liver ROS, GERD  Medicated,  Endo/Other  diabetes, Well Controlled, Type 2, Oral Hypoglycemic Agents  Renal/GU negative Renal ROS     Musculoskeletal negative musculoskeletal ROS (+)   Abdominal   Peds  Hematology   Anesthesia Other Findings   Reproductive/Obstetrics negative OB ROS                             Anesthesia Physical  Anesthesia Plan  ASA: 3  Anesthesia Plan: General   Post-op Pain Management:    Induction: Intravenous  PONV Risk Score and Plan: Propofol infusion and TIVA  Airway Management Planned: Nasal Cannula and Natural Airway  Additional Equipment:   Intra-op Plan:   Post-operative Plan:   Informed  Consent: I have reviewed the patients History and Physical, chart, labs and discussed the procedure including the risks, benefits and alternatives for the proposed anesthesia with the patient or authorized representative who has indicated his/her understanding and acceptance.     Dental advisory given  Plan Discussed with: CRNA and Surgeon  Anesthesia Plan Comments:         Anesthesia Quick Evaluation

## 2021-05-05 NOTE — Anesthesia Postprocedure Evaluation (Signed)
Anesthesia Post Note  Patient: Aimee Reed  Procedure(s) Performed: COLONOSCOPY WITH PROPOFOL POLYPECTOMY  Patient location during evaluation: PACU Anesthesia Type: General Level of consciousness: awake and alert Pain management: pain level controlled Vital Signs Assessment: post-procedure vital signs reviewed and stable Respiratory status: spontaneous breathing Cardiovascular status: blood pressure returned to baseline and stable Postop Assessment: no apparent nausea or vomiting Anesthetic complications: no   No notable events documented.   Last Vitals:  Vitals:   05/05/21 0653 05/05/21 0827  BP: (!) 162/67 134/63  Pulse: 76 81  Resp: 18 17  Temp: 37.2 C 36.6 C  SpO2: 94% 100%    Last Pain:  Vitals:   05/05/21 0827  TempSrc: Oral  PainSc: 0-No pain                 Quinetta Shilling

## 2021-05-06 LAB — SURGICAL PATHOLOGY

## 2021-05-08 ENCOUNTER — Encounter: Payer: Self-pay | Admitting: Internal Medicine

## 2021-05-13 ENCOUNTER — Encounter (HOSPITAL_COMMUNITY): Payer: Self-pay | Admitting: Internal Medicine

## 2021-05-17 DIAGNOSIS — Z01818 Encounter for other preprocedural examination: Secondary | ICD-10-CM | POA: Diagnosis not present

## 2021-05-17 DIAGNOSIS — H25812 Combined forms of age-related cataract, left eye: Secondary | ICD-10-CM | POA: Diagnosis not present

## 2021-05-17 DIAGNOSIS — H25811 Combined forms of age-related cataract, right eye: Secondary | ICD-10-CM | POA: Diagnosis not present

## 2021-06-02 DIAGNOSIS — H2511 Age-related nuclear cataract, right eye: Secondary | ICD-10-CM | POA: Diagnosis not present

## 2021-06-02 DIAGNOSIS — H25811 Combined forms of age-related cataract, right eye: Secondary | ICD-10-CM | POA: Diagnosis not present

## 2021-06-07 DIAGNOSIS — Z23 Encounter for immunization: Secondary | ICD-10-CM | POA: Diagnosis not present

## 2021-06-30 DIAGNOSIS — H25812 Combined forms of age-related cataract, left eye: Secondary | ICD-10-CM | POA: Diagnosis not present

## 2021-07-14 ENCOUNTER — Other Ambulatory Visit: Payer: Self-pay | Admitting: *Deleted

## 2021-07-14 DIAGNOSIS — I739 Peripheral vascular disease, unspecified: Secondary | ICD-10-CM

## 2021-07-20 ENCOUNTER — Ambulatory Visit (INDEPENDENT_AMBULATORY_CARE_PROVIDER_SITE_OTHER): Payer: Medicare Other

## 2021-07-20 ENCOUNTER — Other Ambulatory Visit: Payer: Self-pay

## 2021-07-20 ENCOUNTER — Ambulatory Visit (INDEPENDENT_AMBULATORY_CARE_PROVIDER_SITE_OTHER): Payer: Medicare Other | Admitting: Vascular Surgery

## 2021-07-20 ENCOUNTER — Encounter: Payer: Self-pay | Admitting: Vascular Surgery

## 2021-07-20 VITALS — BP 163/76 | HR 77 | Temp 98.4°F | Wt 223.2 lb

## 2021-07-20 DIAGNOSIS — I739 Peripheral vascular disease, unspecified: Secondary | ICD-10-CM | POA: Diagnosis not present

## 2021-07-20 DIAGNOSIS — R6889 Other general symptoms and signs: Secondary | ICD-10-CM

## 2021-07-20 NOTE — Progress Notes (Signed)
[facility] [place]  Patient name: KATRESE SHELL MRN: [id number] DOB: Dec 13, 1955 Sex: female  REASON FOR CONSULT: Evaluation possible lower extremity arterial insufficiency with abnormal screening study  HPI: JENIE PARISH is a 65 y.o. female, who is here today for further evaluation of possible lower extremity arterial insufficiency.  She underwent screening study from home health nurse and this was abnormal.  She is seen today for formal lower extremity arterial studies and evaluation.  She reports that her husband has undergone what sounds like right common femoral endarterectomy for occlusive disease that she is somewhat familiar with this.  She specifically denies any lower extremity claudication symptoms.  She has had no lower extremity tissue loss.  She is a diabetic which is well treated.  She reports that she is recently lost 50 pounds to help with diabetic control.  Past Medical History:  Diagnosis Date   Anxiety    Blepharospasm    Depression    Diabetes mellitus, type II (HCC)    GERD (gastroesophageal reflux disease)    HTN (hypertension)    Hyperlipidemia 03/11/2012   Irritable bowel syndrome    Overactive bladder    Recurrent UTI    chronic macrodantin   Sleep apnea     Family History  Problem Relation Age of Onset   Depression Father    Ulcers Mother    Alzheimer's disease Mother    Dementia Mother    Alzheimer's disease Maternal Grandmother    Dementia Maternal Grandmother    Diabetes Paternal Grandfather    Other Paternal Grandmother        died during childbirth   Other Brother        back problems   Cancer Sister        lung   Dementia Brother    Alzheimer's disease Brother    Colon cancer Neg Hx    Liver disease Neg Hx    ADD / ADHD Neg Hx    Alcohol abuse Neg Hx    Drug abuse Neg Hx    Anxiety disorder Neg Hx    OCD Neg Hx    Bipolar disorder Neg Hx    Paranoid behavior Neg Hx     Schizophrenia Neg Hx    Seizures Neg Hx    Sexual abuse Neg Hx    Physical abuse Neg Hx    Gastric cancer Neg Hx    Esophageal cancer Neg Hx     SOCIAL HISTORY: Social History   Socioeconomic History   Marital status: Married    Spouse name: Not on file   Number of children: 2   Years of education: Not on file   Highest education level: Not on file  Occupational History   Occupation: unemployed    Employer: UNEMPLOYED  Tobacco Use   Smoking status: Never   Smokeless tobacco: Never  Vaping Use   Vaping Use: Never used  Substance and Sexual Activity   Alcohol use: No   Drug use: No   Sexual activity: Not Currently    Birth control/protection: Post-menopausal  Other Topics Concern   Not on file  Social History Narrative   Not on file   Social Determinants of Health   Financial Resource Strain: Not on file  Food Insecurity: Not on file  Transportation Needs: Not on file  Physical Activity: Not on file  Stress: Not on file  Social Connections: Not on file  Intimate Partner Violence: Not on file  No Known Allergies  Current Outpatient Medications  Medication Sig Dispense Refill   Apple Cider Vinegar 500 MG TABS Take 500 mg by mouth 3 (three) times daily.     aspirin EC 81 MG tablet Take 81 mg by mouth at bedtime.     Calcium Carbonate-Vitamin D (CALTRATE 600+D PO) Take 1 tablet by mouth daily.     Carboxymethylcellulose Sodium (THERATEARS) 0.25 % SOLN Place 1 drop into both eyes daily.     CINNAMON PO Take 1,200 mg by mouth in the morning and at bedtime.     Cyanocobalamin (B-12) 2500 MCG TABS Take 2,500 mcg by mouth daily.     DULoxetine (CYMBALTA) 60 MG capsule Take 60 mg by mouth daily.     JARDIANCE 25 MG TABS tablet TAKE 1 TABLET BY MOUTH  DAILY (Patient taking differently: Take 25 mg by mouth daily.) 90 tablet 0   loratadine (CLARITIN) 10 MG tablet Take 10 mg by mouth daily.     losartan (COZAAR) 100 MG tablet Take 100 mg by mouth daily.     naproxen  sodium (ALEVE) 220 MG tablet Take 440 mg by mouth 2 (two) times daily as needed (pain).     omeprazole (PRILOSEC) 40 MG capsule Take 40 mg by mouth 2 (two) times daily.     OnabotulinumtoxinA (BOTOX IJ) Inject as directed. Every 3 months 6 injection around each eye     oxybutynin (DITROPAN) 5 MG tablet Take 5 mg by mouth 2 (two) times daily.     pantoprazole (PROTONIX) 40 MG tablet Take 1 tablet (40 mg total) by mouth 2 (two) times daily before a meal. 60 tablet 0   propranolol (INDERAL) 10 MG tablet Take 1-2 tablets (10-20 mg total) by mouth 2 (two) times daily. Takes 2 tablets (20mg ) in the mornings and 1 tablet (10mg ) at bedtime (Patient taking differently: Take 10-20 mg by mouth See admin instructions. Takes 2 tablets (20mg ) in the mornings and 1 tablet (10mg ) at bedtime) 90 tablet 3   rosuvastatin (CRESTOR) 5 MG tablet Take 5 mg by mouth every Saturday.     sitaGLIPtin-metformin (JANUMET) 50-1000 MG tablet Take 1 tablet by mouth 2 (two) times daily with a meal.     zolpidem (AMBIEN) 5 MG tablet 2.5- 5 mg at night as needed for sleep (Patient taking differently: Take 2.5-5 mg by mouth at bedtime as needed.) 90 tablet 0   acetaminophen (TYLENOL) 500 MG tablet Take 1,000 mg by mouth every 6 (six) hours as needed for moderate pain.     aspirin-acetaminophen-caffeine (EXCEDRIN MIGRAINE) 250-250-65 MG tablet Take 1 tablet by mouth every 6 (six) hours as needed for headache.     diphenhydramine-acetaminophen (TYLENOL PM) 25-500 MG TABS tablet Take 1 tablet by mouth at bedtime as needed (sleep).     No current facility-administered medications for this visit.    REVIEW OF SYSTEMS:  [X]  denotes positive finding, [ ]  denotes negative finding Cardiac  Comments:  Chest pain or chest pressure:    Shortness of breath upon exertion:    Short of breath when lying flat:    Irregular heart rhythm:        Vascular    Pain in calf, thigh, or hip brought on by ambulation:    Pain in feet at night that  wakes you up from your sleep:     Blood clot in your veins:    Leg swelling:         Pulmonary    Oxygen  at home:    Productive cough:     Wheezing:         Neurologic    Sudden weakness in arms or legs:     Sudden numbness in arms or legs:     Sudden onset of difficulty speaking or slurred speech:    Temporary loss of vision in one eye:     Problems with dizziness:         Gastrointestinal    Blood in stool:     Vomited blood:         Genitourinary    Burning when urinating:     Blood in urine:        Psychiatric    Major depression:         Hematologic    Bleeding problems:    Problems with blood clotting too easily:        Skin    Rashes or ulcers:        Constitutional    Fever or chills:      PHYSICAL EXAM: Vitals:   07/20/21 1356  BP: (!) 163/76  Pulse: 77  Temp: 98.4 F (36.9 C)  TempSrc: Skin  SpO2: 95%  Weight: 223 lb 3.2 oz (101.2 kg)    GENERAL: The patient is a well-nourished female, in no acute distress. The vital signs are documented above. CARDIOVASCULAR: Carotid arteries are without bruits bilaterally.  2+ radial pulses bilaterally.  2+ dorsalis pedis pulse and 2+ posterior tibial pulses bilaterally. PULMONARY: There is good air exchange  MUSCULOSKELETAL: There are no major deformities or cyanosis. NEUROLOGIC: No focal weakness or paresthesias are detected. SKIN: There are no ulcers or rashes noted. PSYCHIATRIC: The patient has a normal affect.  DATA:  Formal noninvasive studies in our office reveal noncompressible vessels therefore ankle arm index is not obtainable.  She has normal triphasic waveforms at the pedal level bilaterally.  MEDICAL ISSUES: Discussed these findings with the patient.  I explained that she does not have any evidence of lower extremity occlusive disease.  I did explain the changes in her vessels with calcification making the vessels noncompressible.  She will continue her maximal medical management of her medical  issues.  She also will continue close foot watch due to her diabetes.  I explained that she is at no significant increased risk for lower extremity issues related to her arterial flow.  She will see [place] again on an as-needed basis   Rosetta Posner, MD [facility] Tel [phone] Pager [phone]  Note: Portions of this report may have been transcribed using voice recognition software.  Every effort has been made to ensure accuracy; however, inadvertent computerized transcription errors may still be present.

## 2021-08-01 DIAGNOSIS — G245 Blepharospasm: Secondary | ICD-10-CM | POA: Diagnosis not present

## 2021-08-08 DIAGNOSIS — I10 Essential (primary) hypertension: Secondary | ICD-10-CM | POA: Diagnosis not present

## 2021-08-08 DIAGNOSIS — K219 Gastro-esophageal reflux disease without esophagitis: Secondary | ICD-10-CM | POA: Diagnosis not present

## 2021-08-08 DIAGNOSIS — E1165 Type 2 diabetes mellitus with hyperglycemia: Secondary | ICD-10-CM | POA: Diagnosis not present

## 2021-08-08 DIAGNOSIS — M1991 Primary osteoarthritis, unspecified site: Secondary | ICD-10-CM | POA: Diagnosis not present

## 2021-08-09 ENCOUNTER — Other Ambulatory Visit (HOSPITAL_COMMUNITY): Payer: Self-pay | Admitting: Internal Medicine

## 2021-08-09 DIAGNOSIS — M25562 Pain in left knee: Secondary | ICD-10-CM

## 2021-08-10 ENCOUNTER — Ambulatory Visit (HOSPITAL_COMMUNITY)
Admission: RE | Admit: 2021-08-10 | Discharge: 2021-08-10 | Disposition: A | Discharge status: Home / Self Care | Payer: Medicare Other | Source: Ambulatory Visit | Attending: Internal Medicine | Admitting: Internal Medicine

## 2021-08-10 ENCOUNTER — Other Ambulatory Visit (HOSPITAL_COMMUNITY): Payer: Self-pay | Admitting: Internal Medicine

## 2021-08-10 ENCOUNTER — Other Ambulatory Visit: Payer: Self-pay

## 2021-08-10 DIAGNOSIS — M25562 Pain in left knee: Secondary | ICD-10-CM | POA: Diagnosis not present

## 2021-08-10 DIAGNOSIS — M1712 Unilateral primary osteoarthritis, left knee: Secondary | ICD-10-CM | POA: Diagnosis not present

## 2021-08-10 DIAGNOSIS — M25462 Effusion, left knee: Secondary | ICD-10-CM | POA: Diagnosis not present

## 2021-09-14 ENCOUNTER — Other Ambulatory Visit (HOSPITAL_COMMUNITY): Payer: Self-pay | Admitting: Family Medicine

## 2021-09-14 DIAGNOSIS — Z1231 Encounter for screening mammogram for malignant neoplasm of breast: Secondary | ICD-10-CM

## 2021-09-22 ENCOUNTER — Other Ambulatory Visit: Payer: Self-pay

## 2021-09-22 ENCOUNTER — Ambulatory Visit (HOSPITAL_COMMUNITY)
Admission: RE | Admit: 2021-09-22 | Discharge: 2021-09-22 | Disposition: A | Discharge status: Home / Self Care | Payer: Medicare Other | Source: Ambulatory Visit | Attending: Family Medicine | Admitting: Family Medicine

## 2021-09-22 DIAGNOSIS — Z1231 Encounter for screening mammogram for malignant neoplasm of breast: Secondary | ICD-10-CM | POA: Diagnosis not present

## 2021-10-14 IMAGING — MG DIGITAL SCREENING BILAT W/ TOMO W/ CAD
6 of 10 series · 6 of 30 positions shown · non-contrast
Comparison: Previous exam(s).

CLINICAL DATA: Screening.

EXAM:
DIGITAL SCREENING BILATERAL MAMMOGRAM WITH TOMO AND CAD

[R CC synth-2D (1 of 2)]
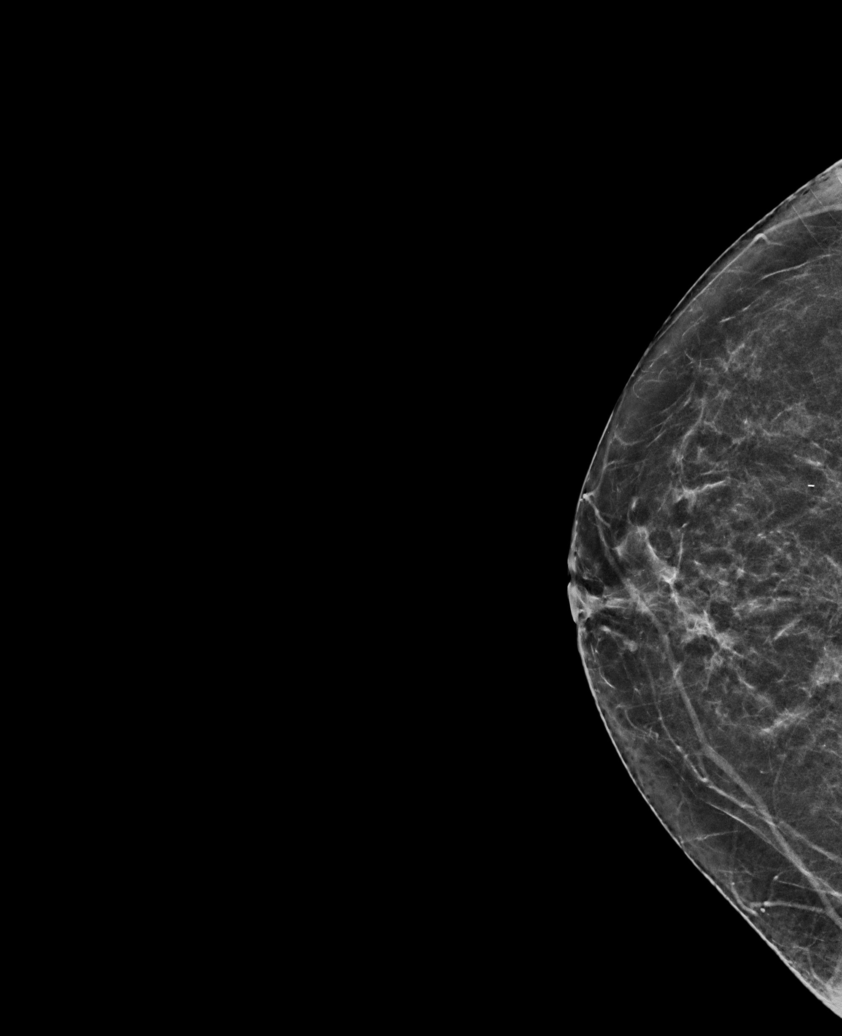

[L CC synth-2D]
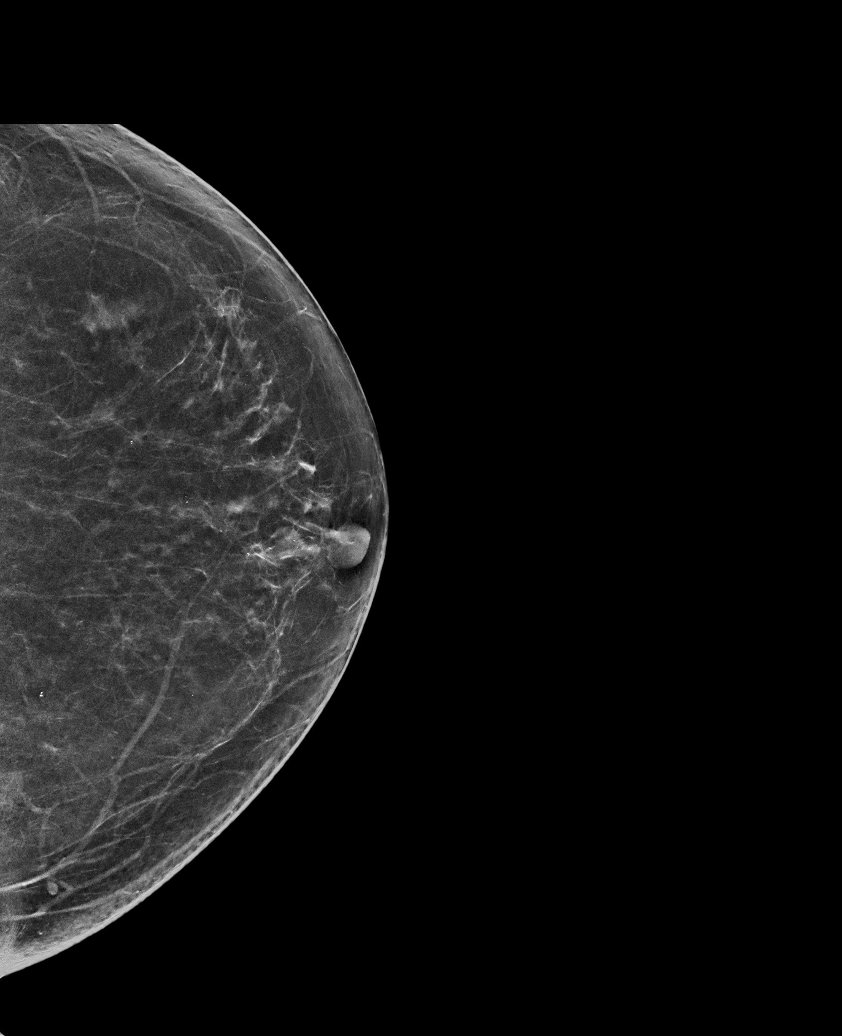

[L MLO synth-2D]
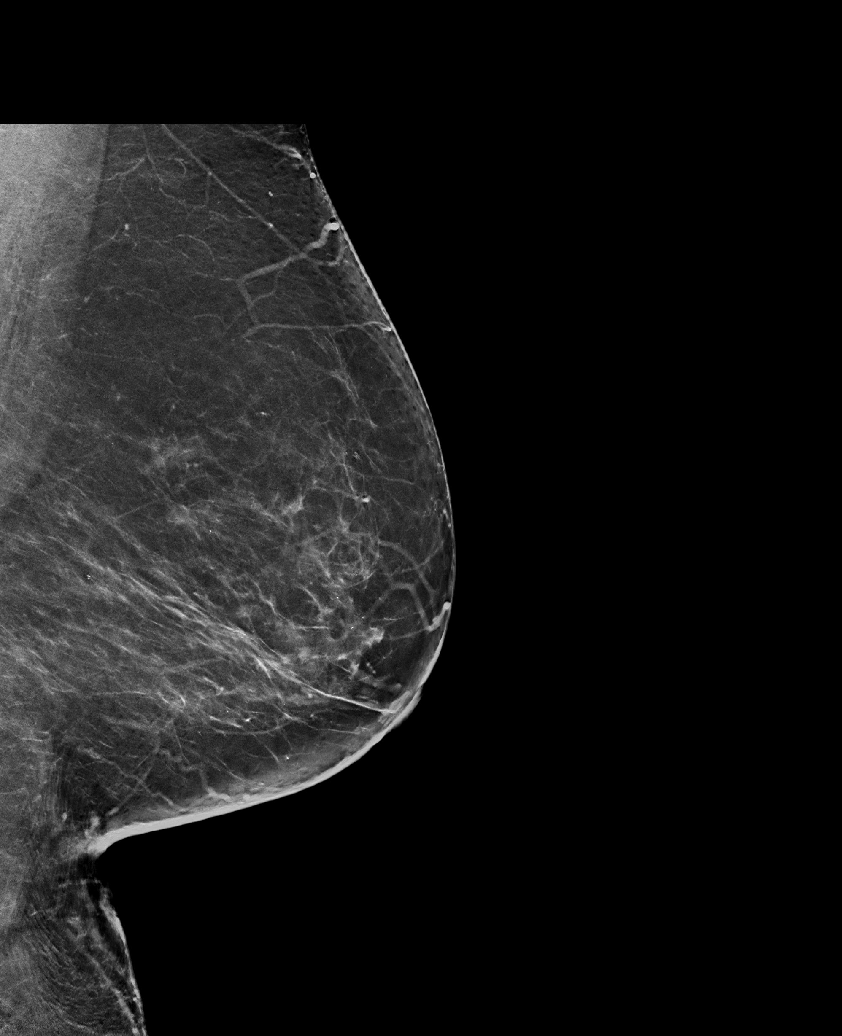

[R CC synth-2D (2 of 2)]
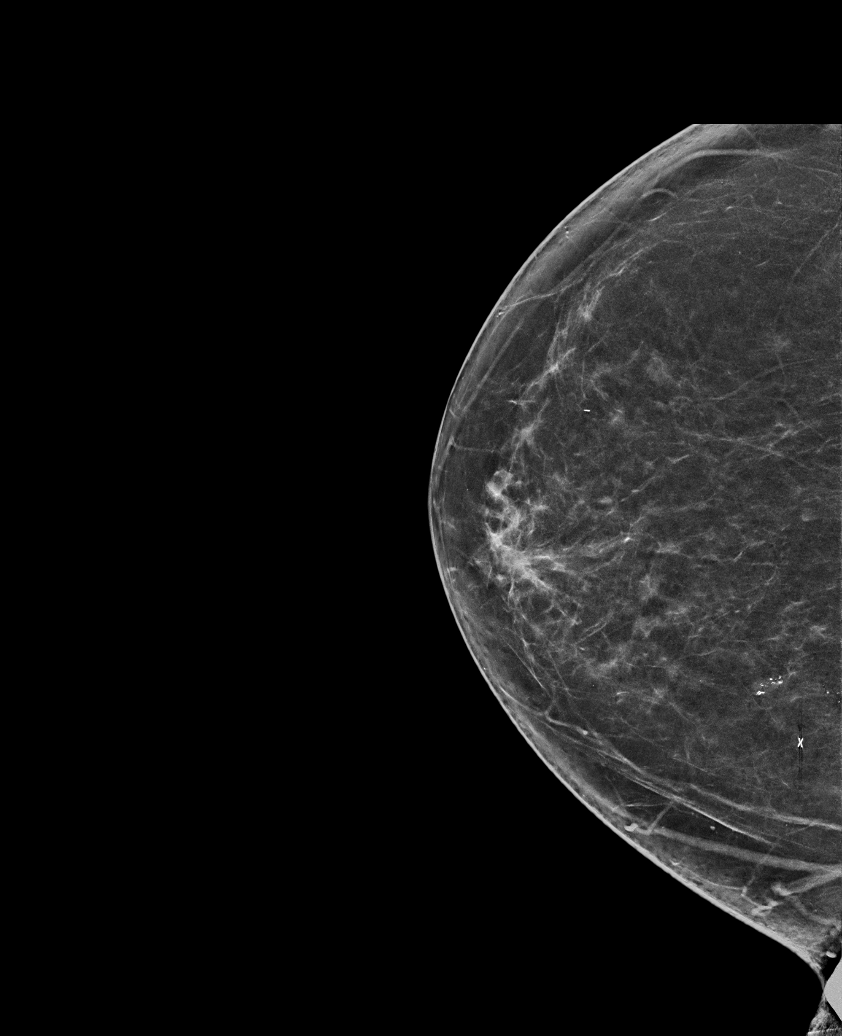

[R MLO synth-2D]
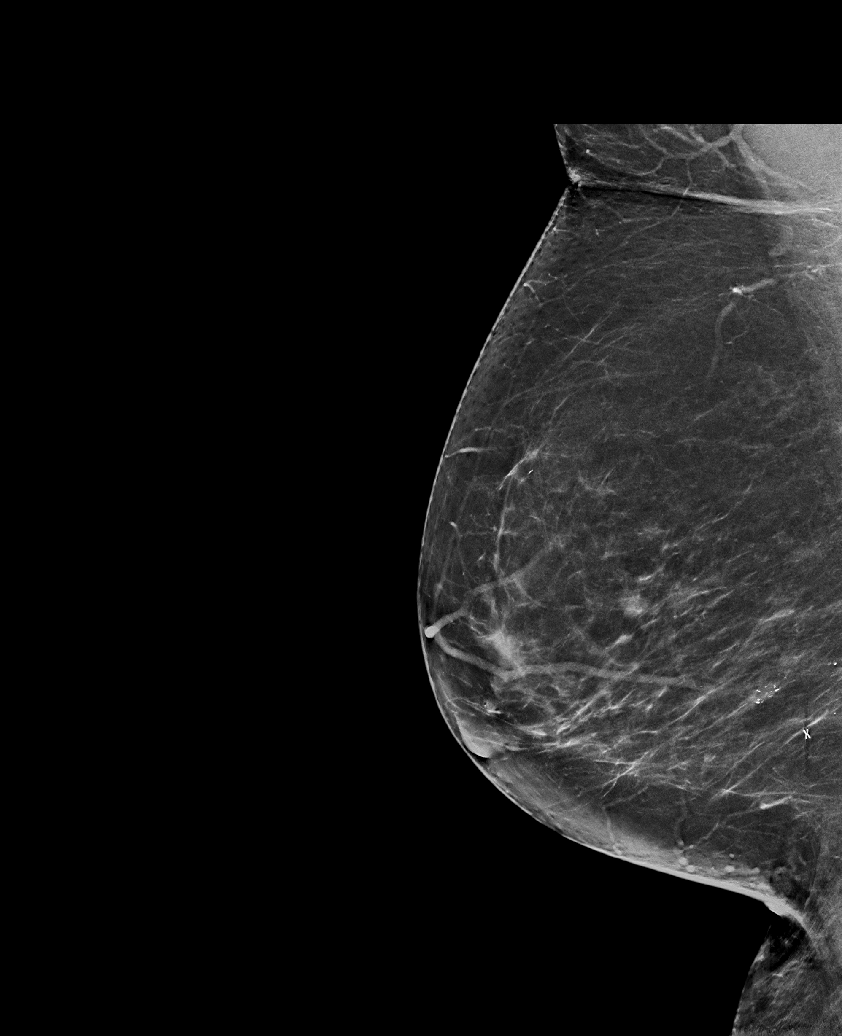

[L MLO tomo · tomo slice 38/75.0]
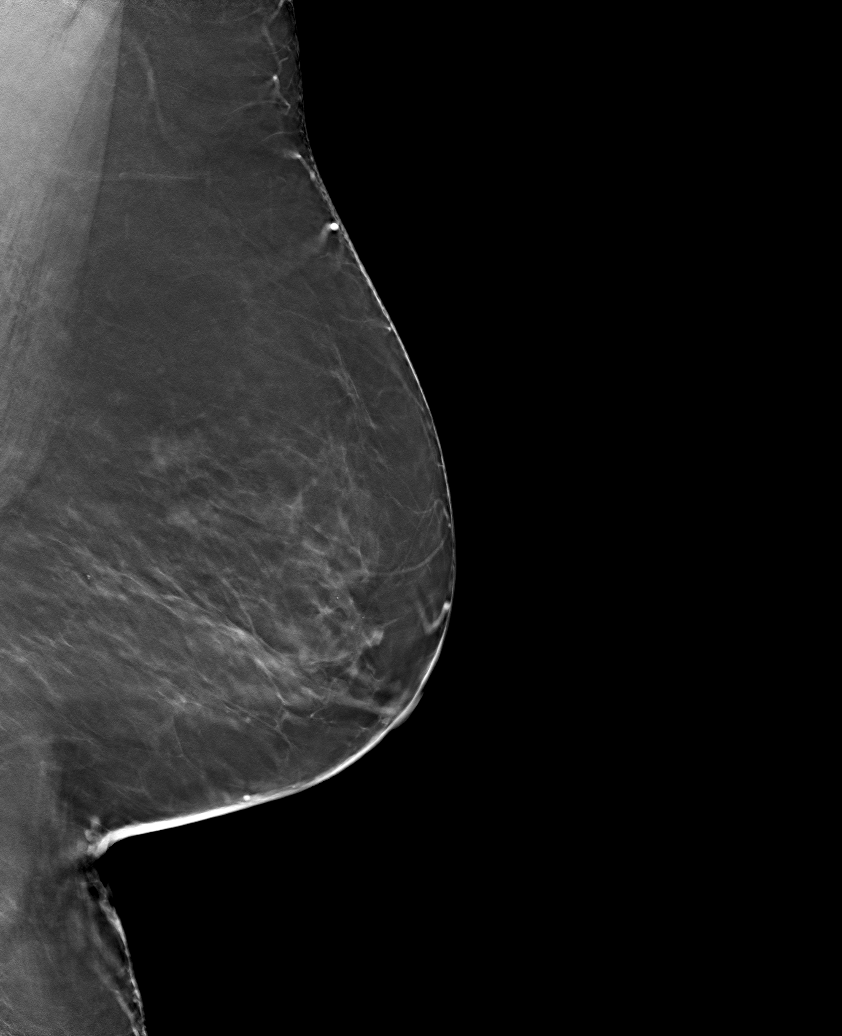

[6 of 30 positions shown; findings below may reference images not displayed]

ACR Breast Density Category b: There are scattered areas of
fibroglandular density.
FINDINGS: In the right breast, calcifications warrant further evaluation with
magnified views. In the left breast, no findings suspicious for
malignancy. Images were processed with CAD.
IMPRESSION: Further evaluation is suggested for calcifications in the right
breast.

RECOMMENDATION:
Diagnostic mammogram of the right breast. (Code:[id number]-[id number])

The patient will be contacted regarding the findings, and additional
imaging will be scheduled.

BI-RADS CATEGORY  0: Incomplete. Need additional imaging evaluation
and/or prior mammograms for comparison.

## 2021-10-31 DIAGNOSIS — G245 Blepharospasm: Secondary | ICD-10-CM | POA: Diagnosis not present

## 2021-11-08 ENCOUNTER — Other Ambulatory Visit: Payer: Self-pay | Admitting: *Deleted

## 2021-11-08 DIAGNOSIS — R222 Localized swelling, mass and lump, trunk: Secondary | ICD-10-CM

## 2021-11-08 DIAGNOSIS — B070 Plantar wart: Secondary | ICD-10-CM | POA: Diagnosis not present

## 2021-11-08 DIAGNOSIS — D235 Other benign neoplasm of skin of trunk: Secondary | ICD-10-CM | POA: Diagnosis not present

## 2021-11-08 DIAGNOSIS — D179 Benign lipomatous neoplasm, unspecified: Secondary | ICD-10-CM | POA: Diagnosis not present

## 2021-11-08 DIAGNOSIS — E1165 Type 2 diabetes mellitus with hyperglycemia: Secondary | ICD-10-CM | POA: Diagnosis not present

## 2021-11-09 ENCOUNTER — Other Ambulatory Visit: Payer: Self-pay

## 2021-11-09 ENCOUNTER — Ambulatory Visit: Payer: Medicare Other | Admitting: Surgery

## 2021-11-09 ENCOUNTER — Encounter: Payer: Self-pay | Admitting: Surgery

## 2021-11-09 VITALS — BP 164/71 | HR 80 | Temp 98.4°F | Resp 14 | Ht 64.0 in | Wt 223.0 lb

## 2021-11-09 DIAGNOSIS — L723 Sebaceous cyst: Secondary | ICD-10-CM | POA: Diagnosis not present

## 2021-11-09 NOTE — Progress Notes (Signed)
[facility] History and Physical  Reason for Referral: Sebaceous cyst Referring Physician: Sharilyn Sites, MD  Chief Complaint   New Patient (Initial Visit)     Aimee Reed is a 66 y.o. female.  HPI: Patient presents for multiple sebaceous cyst on her back.  She states that she previously had a sebaceous cyst on her right shoulder that was excised in office without issue.  She has noted a new sebaceous cyst in a different location on her right shoulder, and she believes she has 1 in her midline back as well.  She states that they will occasionally drain thick white drainage when her daughter and grandchildren tried to pop them.  She has no significant complaints of pain associated with them.  She denies ever having I&D of them previously.  Her past medical history significant for diabetes, hypertension, and GERD.  Her surgical history is significant for cholecystectomy.  She takes an 81 mg aspirin daily.  She denies use of tobacco products, alcohol, and illicit drugs.  Past Medical History:  Diagnosis Date   Anxiety    Blepharospasm    Depression    Diabetes mellitus, type II ([place])    GERD (gastroesophageal reflux disease)    HTN (hypertension)    Hyperlipidemia 03/11/2012   Irritable bowel syndrome    Overactive bladder    Recurrent UTI    chronic macrodantin   Sleep apnea     Past Surgical History:  Procedure Laterality Date   BIOPSY  11/18/2020   Procedure: BIOPSY;  Surgeon: Daneil Dolin, MD;  [facility];  Service: Endoscopy;;   CARPAL TUNNEL RELEASE     both hands   CHOLECYSTECTOMY     COLONOSCOPY     Dr. Arnoldo Morale, around 2010   COLONOSCOPY N/A 02/25/2013   NFA:OZHYQMV polyps, next tcs 02/2018 give tubular adenomas   COLONOSCOPY WITH PROPOFOL N/A 05/05/2021   Procedure: COLONOSCOPY WITH PROPOFOL;  Surgeon: Daneil Dolin, MD;  [facility];  Service: Endoscopy;  Laterality: N/A;  7:30am   ESOPHAGOGASTRODUODENOSCOPY  07/31/2011    HQI:ONGEXB cervical esophageal web s/p dilation,query short segment Barrett's esophagus s/p bx (Barrett's esophagus without dysplasia),small hiatal hernia. 18 French Maloney dilator.   ESOPHAGOGASTRODUODENOSCOPY (EGD) WITH ESOPHAGEAL DILATION N/A 02/25/2013   MWU:[id number] distal esophagus/HH. next EGD 02/2016 given prior h/o Barrett's   ESOPHAGOGASTRODUODENOSCOPY (EGD) WITH PROPOFOL N/A 11/18/2020   Barrett's esophagus biopsy confirmed. no esophageal stricture s/p dilation for history of dysphagia, small hiatal hernia.   EYE SURGERY  07/13/2015   Eyelids, nerve issues   MALONEY DILATION N/A 11/18/2020   Procedure: MALONEY DILATION;  Surgeon: Daneil Dolin, MD;  [facility];  Service: Endoscopy;  Laterality: N/A;   POLYPECTOMY  05/05/2021   Procedure: POLYPECTOMY;  Surgeon: Daneil Dolin, MD;  [facility];  Service: Endoscopy;;    Family History  Problem Relation Age of Onset   Depression Father    Ulcers Mother    Alzheimer's disease Mother    Dementia Mother    Alzheimer's disease Maternal Grandmother    Dementia Maternal Grandmother    Diabetes Paternal Grandfather    Other Paternal Grandmother        died during childbirth   Other Brother        back problems   Cancer Sister        lung   Dementia Brother    Alzheimer's disease Brother    Colon cancer Neg Hx  Liver disease Neg Hx    ADD / ADHD Neg Hx    Alcohol abuse Neg Hx    Drug abuse Neg Hx    Anxiety disorder Neg Hx    OCD Neg Hx    Bipolar disorder Neg Hx    Paranoid behavior Neg Hx    Schizophrenia Neg Hx    Seizures Neg Hx    Sexual abuse Neg Hx    Physical abuse Neg Hx    Gastric cancer Neg Hx    Esophageal cancer Neg Hx     Social History   Tobacco Use   Smoking status: Never   Smokeless tobacco: Never  Vaping Use   Vaping Use: Never used  Substance Use Topics   Alcohol use: No   Drug use: No    Medications: I have reviewed the patient's current  medications. Allergies as of 11/09/2021   No Known Allergies      Medication List        Accurate as of November 09, 2021  4:02 PM. If you have any questions, ask your nurse or doctor.          acetaminophen 500 MG tablet Commonly known as: TYLENOL Take 1,000 mg by mouth every 6 (six) hours as needed for moderate pain.   Apple Cider Vinegar 500 MG Tabs Take 500 mg by mouth 3 (three) times daily.   aspirin EC 81 MG tablet Take 81 mg by mouth at bedtime.   aspirin-acetaminophen-caffeine 250-250-65 MG tablet Commonly known as: EXCEDRIN MIGRAINE Take 1 tablet by mouth every 6 (six) hours as needed for headache.   B-12 2500 MCG Tabs Take 2,500 mcg by mouth daily.   BOTOX IJ Inject as directed. Every 3 months 6 injection around each eye   CALTRATE 600+D PO Take 1 tablet by mouth daily.   CINNAMON PO Take 1,200 mg by mouth in the morning and at bedtime.   diphenhydramine-acetaminophen 25-500 MG Tabs tablet Commonly known as: TYLENOL PM Take 1 tablet by mouth at bedtime as needed (sleep).   DULoxetine 60 MG capsule Commonly known as: CYMBALTA Take 60 mg by mouth daily.   Jardiance 25 MG Tabs tablet Generic drug: empagliflozin TAKE 1 TABLET BY MOUTH  DAILY What changed: how much to take   loratadine 10 MG tablet Commonly known as: CLARITIN Take 10 mg by mouth daily.   losartan 100 MG tablet Commonly known as: COZAAR Take 100 mg by mouth daily.   naproxen sodium 220 MG tablet Commonly known as: ALEVE Take 440 mg by mouth 2 (two) times daily as needed (pain).   omeprazole 40 MG capsule Commonly known as: PRILOSEC Take 40 mg by mouth 2 (two) times daily.   oxybutynin 5 MG tablet Commonly known as: DITROPAN Take 5 mg by mouth 2 (two) times daily.   pantoprazole 40 MG tablet Commonly known as: PROTONIX Take 1 tablet (40 mg total) by mouth 2 (two) times daily before a meal.   propranolol 10 MG tablet Commonly known as: INDERAL Take 1-2 tablets (10-20 mg  total) by mouth 2 (two) times daily. Takes 2 tablets (20mg ) in the mornings and 1 tablet (10mg ) at bedtime What changed: when to take this   rosuvastatin 5 MG tablet Commonly known as: CRESTOR Take 5 mg by mouth every Saturday.   sitaGLIPtin-metformin 50-1000 MG tablet Commonly known as: JANUMET Take 1 tablet by mouth 2 (two) times daily with a meal.   Theratears 0.25 % Soln Generic drug: Carboxymethylcellulose Sodium Place  1 drop into both eyes daily.   zolpidem 5 MG tablet Commonly known as: AMBIEN 2.5- 5 mg at night as needed for sleep What changed:  how much to take how to take this when to take this reasons to take this additional instructions         ROS:  Constitutional: negative for chills, fatigue, and fevers Eyes: negative for visual disturbance and pain Ears, nose, mouth, throat, and face: negative for ear drainage, sore throat, and sinus problems Respiratory: negative for cough, wheezing, and shortness of breath Cardiovascular: negative for chest pain and palpitations Gastrointestinal: negative for abdominal pain, nausea, reflux symptoms, and vomiting Genitourinary:negative for dysuria, frequency, and urinary retention Integument/breast: negative for dryness and rash Hematologic/lymphatic: negative for bleeding and lymphadenopathy Musculoskeletal:negative for back pain, neck pain, and joint pain Neurological: negative for dizziness, tremors, and numbness Endocrine: negative for temperature intolerance  Blood pressure (!) 164/71, pulse 80, temperature 98.4 F (36.9 C), temperature source Other (Comment), resp. rate 14, height 5\' 4"  (1.626 m), weight 223 lb (101.2 kg), SpO2 98 %. Physical Exam Vitals reviewed.  Constitutional:      Appearance: Normal appearance.  HENT:     Head: Normocephalic and atraumatic.  Eyes:     Extraocular Movements: Extraocular movements intact.     Pupils: Pupils are equal, round, and reactive to light.  Cardiovascular:      Rate and Rhythm: Normal rate.  Pulmonary:     Effort: Pulmonary effort is normal.  Abdominal:     Palpations: Abdomen is soft.     Tenderness: There is no abdominal tenderness.  Musculoskeletal:        General: Normal range of motion.     Cervical back: Normal range of motion.  Skin:    General: Skin is warm and dry.     Comments: Palpable mass on right shoulder, no active drainage, erythema or induration; palpable mass midline thoracic spine area, no active drainage, erythema or induration  Neurological:     General: No focal deficit present.     Mental Status: She is alert and oriented to person, place, and time.  Psychiatric:        Mood and Affect: Mood normal.        Behavior: Behavior normal.    Results: No results found for this or any previous visit (from the past 48 hour(s)).  No results found.   Assessment & Plan:  Aimee Reed is a 66 y.o. female who presents for evaluation of multiple sebaceous cyst on her back.  -Patient was able to tolerate right shoulder sebaceous cyst excision in office many years ago -The risks and benefits of sebaceous cyst excision on right shoulder midline back were discussed, including but not limited to bleeding, and need for additional medical procedure.  After careful consideration, Aimee Reed has decided to proceed with excision of sebaceous cyst on her back -Scheduled for in-office excision of cysts on 3/13  All questions were answered to the satisfaction of the patient.   Graciella Freer, DO [facility] [address] [place], [place] [id number] [phone] (office)

## 2021-11-14 DIAGNOSIS — E1165 Type 2 diabetes mellitus with hyperglycemia: Secondary | ICD-10-CM | POA: Diagnosis not present

## 2021-11-14 DIAGNOSIS — M1991 Primary osteoarthritis, unspecified site: Secondary | ICD-10-CM | POA: Diagnosis not present

## 2021-11-14 DIAGNOSIS — I10 Essential (primary) hypertension: Secondary | ICD-10-CM | POA: Diagnosis not present

## 2021-11-14 DIAGNOSIS — K219 Gastro-esophageal reflux disease without esophagitis: Secondary | ICD-10-CM | POA: Diagnosis not present

## 2021-11-14 DIAGNOSIS — R011 Cardiac murmur, unspecified: Secondary | ICD-10-CM | POA: Diagnosis not present

## 2021-11-14 DIAGNOSIS — Z23 Encounter for immunization: Secondary | ICD-10-CM | POA: Diagnosis not present

## 2021-11-14 DIAGNOSIS — Z0001 Encounter for general adult medical examination with abnormal findings: Secondary | ICD-10-CM | POA: Diagnosis not present

## 2021-11-14 DIAGNOSIS — G4733 Obstructive sleep apnea (adult) (pediatric): Secondary | ICD-10-CM | POA: Diagnosis not present

## 2021-11-21 ENCOUNTER — Encounter: Payer: Self-pay | Admitting: Surgery

## 2021-11-21 ENCOUNTER — Ambulatory Visit (INDEPENDENT_AMBULATORY_CARE_PROVIDER_SITE_OTHER): Payer: Medicare Other | Admitting: Surgery

## 2021-11-21 ENCOUNTER — Other Ambulatory Visit: Payer: Self-pay

## 2021-11-21 VITALS — BP 155/86 | HR 80 | Temp 98.3°F | Resp 16 | Ht 64.0 in | Wt 223.0 lb

## 2021-11-21 DIAGNOSIS — L918 Other hypertrophic disorders of the skin: Secondary | ICD-10-CM | POA: Diagnosis not present

## 2021-11-21 DIAGNOSIS — L723 Sebaceous cyst: Secondary | ICD-10-CM | POA: Diagnosis not present

## 2021-11-21 DIAGNOSIS — L720 Epidermal cyst: Secondary | ICD-10-CM | POA: Diagnosis not present

## 2021-11-21 MED ORDER — DOUBLE ANTIBIOTIC 500-10000 UNIT/GM EX OINT: 1.0000 "application " | TOPICAL_OINTMENT | Freq: Two times a day (BID) | CUTANEOUS | 1 refills | Status: DC

## 2021-11-21 NOTE — Progress Notes (Signed)
PROCEDURE REPORT ?[facility] ? ?DATE OF PROCEDURE: 11/21/2021 ? ?ATTENDING: Graciella Freer, DO ? ?ANESTHESIA: Local ? ?PRE-OPERATIVE DIAGNOSIS: Increasingly symptomatic (painful) sebaceous cyst of the right upper back without prior infection; Increasingly symptomatic (painful) sebaceous cyst of the midline back without prior infection  ?  ?POST-OPERATIVE DIAGNOSIS: Increasingly symptomatic (painful) sebaceous cyst of the right upper back without prior infection; Increasingly symptomatic (painful) sebaceous cyst of the midline back without prior infection  ?  ?PROCEDURE(S):  ?1.) Excision of sebaceous cyst of the right upper back ?2.) Excision of sebaceous cyst of the midline back  ?3.) Excision of posterior neck skin tag ?  ?INTRAOPERATIVE FINDINGS: 3.5 cm x 2.5 cm x 2 cm non-infected subcutaneous/mass of the right upper back (most likely a sebaceous cyst), removed intact; 2.5 cm x 2 cm x 2 cm non-infected subcutaneous/mass of the right upper back (most likely a sebaceous cyst), removed intact; 0.3 cm skin tag ? ?ESTIMATED BLOOD LOSS: Minimal (< 20 mL) ? ?URINE OUTPUT: No Foley ? ?SPECIMENS: 3.5 cm x 2.5 cm x 2 cm non-infected subcutaneous/mass of the right upper back (most likely a sebaceous cyst), removed intact; 2.5 cm x 2 cm x 2 cm non-infected subcutaneous/mass of the right upper back (most likely a sebaceous cyst), removed intact; 0.3 cm skin tag ? ?IMPLANTS: None ? ?DRAINS: None ? ?COMPLICATIONS: None apparent ? ?CONDITION AT END OF PROCEDURE: Hemodynamically stable and awake ? ?DISPOSITION OF PATIENT: Discharge home ? ?INDICATIONS FOR PROCEDURE:  ?Patient is a 66 y.o. female who presented for an increasingly symptomatic (painful) sebaceous cyst of the right upper back without prior infection and an additional increasingly symptomatic (painful) sebaceous cyst of the midline back without prior infection.  Patient requested that it be removed so activities can be performed with less  discomfort, and patient was accordingly referred for surgical evaluation and management.  ? ?All risks, benefits, and alternatives to above procedure were discussed with the patient, all of patient's questions were answered to his expressed satisfaction, and informed consent was obtained and documented. ?  ?DETAILS OF PROCEDURE: ?Patient was brought to the procedure room and was appropriately identified. In a prone position on the procedure room bed, the right upper back mass was prepped and draped in the usual sterile fashion, and following a brief time out, an elliptical incision was made using a #15 blade scalpel.  The incision was extended deep around subcutaneous mass using sharp and blunt dissection. During the course of dissecting free the cyst, it was not disrupted and was removed intact. Direct pressure was applied for hemostasis. Further examination did not reveal any additional cyst. Hemostasis was achieved, and the wound was copiously irrigated with sterile warm saline. Deep tissue and dermis were re-approximated using buried interrupted 3-0 Vicryl suture, and vertical mattress 3-0 Nylon suture was used to re-approximate epidermis. Skin was then cleaned and dried, and sterile bacitracin was applied to the wound and Bandaid was applied.  This mass measured 3.5 cm x 2.5 cm x 2 cm, and was sent to pathology for evaluation. ? ?Patient has been turned to midline back mass.  This site was prepped and draped in usual sterile fashion, and an elliptical incision was made using a #15 blade scalpel.  The incision was extended deep around subcutaneous mass using sharp and blunt dissection. During the course of dissecting free the cyst, it was not disrupted and was removed intact. Direct pressure was applied for hemostasis. Further examination did not reveal any additional cyst. Hemostasis was achieved,  and the wound was copiously irrigated with sterile warm saline. Deep tissue and dermis were re-approximated using  buried interrupted 3-0 Vicryl suture, and vertical mattress 3-0 Nylon suture was used to re-approximate epidermis. Skin was then cleaned and dried, and sterile bacitracin was applied to the wound and Bandaid was applied.  This mass measured 2.5 cm x 2 cm x 2 cm, and was sent to pathology for evaluation ? ?Attention was then turned to the posterior neck skin tag.  Using #15 blade scalpel, the skin tag shaved off.  The mass measured 0.3 cm, and was sent to pathology for evaluation. ? ?Patient tolerated the procedure without difficulty.  She is stable to leave the office, and prescription for bacitracin ointment was sent to her pharmacy.  She will follow-up with me on 3/28 for evaluation for suture removal.. ? ?I was present for all aspects of the above procedure, and no operative complications were apparent. ? ?Graciella Freer, DO ?[facility] ?[place], [place] [address] ?[phone] (office) ? ? ?

## 2021-11-21 NOTE — Addendum Note (Signed)
Addended by: Sheral Flow on: 11/21/2021 02:57 PM ? ? Modules accepted: Orders ? ?

## 2021-11-21 NOTE — Patient Instructions (Signed)
Discharge Instruction ? ?Activity ? Resume light activity. No heavy lifting over 10 lbs or strenuous exercise. ? ?Fluids and Diet ?Regular diet ? ?Medications ? If you have not had a bowel movement in 24 hours, take 2 tablespoons over the counter Milk of mag. ?            You May resume your blood thinners tomorrow (Aspirin, coumadin, or other).  ?Operative Site ? You have outer sutures from your surgical sites. These will be removed in 2 weeks when you follow up in office.  Keep covered with antibiotic ointment. Ok to Games developer. Keep wound clean and dry. No baths or swimming. No lifting more than 10 pounds. ? ?Contact Information: ?If you have questions or concerns, please call our office, [phone], Monday- Thursday 8AM-5PM and Friday 8AM-12Noon.  ?If it is after hours or on the weekend, please call [facility], [phone], and ask to speak to the surgeon on call for Dr. Okey Dupre at [facility].  ? ?SPECIFIC COMPLICATIONS TO WATCH FOR: ?Fever over 101? F by mouth ?Nausea and vomiting lasting longer than 24 hours. ?Pain not relieved by medication ordered ?Swelling around the operative site ?Increased redness, warmth, hardness, around operative area ?Blood -soaked dressing, (small amounts of oozing may be normal) ?Increasing and progressive drainage from surgical area or exam site ? ?

## 2021-11-24 LAB — PATHOLOGY REPORT

## 2021-11-24 LAB — TISSUE PATH REPORT 10802

## 2021-12-06 ENCOUNTER — Other Ambulatory Visit: Payer: Self-pay

## 2021-12-06 ENCOUNTER — Ambulatory Visit (INDEPENDENT_AMBULATORY_CARE_PROVIDER_SITE_OTHER): Payer: Medicare Other | Admitting: Surgery

## 2021-12-06 ENCOUNTER — Encounter: Payer: Self-pay | Admitting: Surgery

## 2021-12-06 VITALS — BP 144/74 | HR 77 | Temp 98.5°F | Resp 12 | Ht 64.0 in | Wt 226.0 lb

## 2021-12-06 DIAGNOSIS — Z09 Encounter for follow-up examination after completed treatment for conditions other than malignant neoplasm: Secondary | ICD-10-CM

## 2021-12-06 NOTE — Progress Notes (Signed)
Aimee Reed  ? ?HPI:  ?66 y.o. Female presents to clinic for post-op follow-up status post excision of right posterior shoulder mass and midline back mass in the office on 3/13. Patient reports that she has been doing well since the procedure.  Denies fevers and chills.  She has noted a little bit of brown, yellow drainage from her incision sites.  She has no significant pain associated with the incision sites ? ?Review of Systems:  ?All other review of systems: otherwise negative  ? ?Vital Signs:  ?BP (!) 144/74   Pulse 77   Temp 98.5 ?F (36.9 ?C) (Other (Comment))   Resp 12   Ht '5\' 4"'$  (1.626 m)   Wt 226 lb (102.5 kg)   SpO2 96%   BMI 38.79 kg/m?   ? ?Physical Exam:  ?Physical Exam ?Vitals reviewed.  ?Constitutional:   ?   Appearance: Normal appearance.  ?Skin: ?   General: Skin is warm and dry.  ?   Comments: Right posterior shoulder incision site healing well on the medial side with serous drainage from the lateral side of the incision; midline back incision still healing, with serous drainage noted, mild erythema at both incision sites associated with sutures, no evidence of infection  ?Neurological:  ?   Mental Status: She is alert.  ? ?Laboratory studies: None ? ?Imaging:  ?None ? ?Pathology: ?A Diagnosis   ?Comment: Epidermal inclusion cyst.  ? ?B Diagnosis   ?Comment: Epidermal inclusion cyst.  ? ? ?Assessment:  ?66 y.o. yo Female who presents status post office excision of right posterior shoulder mass and midline back mass on 3/13. ? ?Plan:  ?-She has been doing well since her procedure ?-Sutures removed from the right posterior shoulder excision, lateral aspect of incision beginning to open.  The incision site was drained (serous drainage) and Steri-Strips were applied to the lateral aspect of the incision to promote healing.  I did explain, that if the incision fails to heal, may need to treat this open area as a wound that will close by secondary intention ?-The midline  back incision site appears to still be healing, so decision was made to leave the sutures in place for another week.  Will reevaluate incision, and likely remove sutures at her follow-up ?-Follow up with me on 4/6 ? ?All of the above recommendations were discussed with the patient, and all of patient's questions were answered to her expressed satisfaction. ? ?Aimee Freer, DO ?[facility] ?[place], [place] [address] ?[phone] (office) ? ? ?

## 2021-12-13 ENCOUNTER — Other Ambulatory Visit: Payer: Self-pay

## 2021-12-13 ENCOUNTER — Ambulatory Visit (INDEPENDENT_AMBULATORY_CARE_PROVIDER_SITE_OTHER): Payer: Medicare Other | Admitting: Surgery

## 2021-12-13 ENCOUNTER — Encounter: Payer: Self-pay | Admitting: Surgery

## 2021-12-13 VITALS — BP 145/89 | HR 69 | Temp 98.1°F | Resp 14 | Ht 64.0 in | Wt 227.0 lb

## 2021-12-13 DIAGNOSIS — Z09 Encounter for follow-up examination after completed treatment for conditions other than malignant neoplasm: Secondary | ICD-10-CM

## 2021-12-13 MED ORDER — CEPHALEXIN 500 MG PO CAPS: 500.0000 mg | ORAL_CAPSULE | Freq: Four times a day (QID) | ORAL | 0 refills | Status: AC

## 2021-12-13 NOTE — Progress Notes (Signed)
[facility] Surgical Clinic Note  ? ?HPI:  ?66 y.o. Female presents to clinic for post-op follow-up status post excision of right posterior shoulder mass and midline back mass in office on 3/13.  Since she was last evaluated in the office, right posterior shoulder incision site has opened up regardless of the Steri-Strips being present.  She states that both incisions have had yellowish-brownish drainage on packing.  She denies noting any pus coming from the incisions.  She denies any fevers or chills.  She also denies significant pain associated with her incision sites. ? ?Review of Systems:  ?All other review of systems: otherwise negative  ? ?Vital Signs:  ?BP (!) 145/89   Pulse 69   Temp 98.1 ?F (36.7 ?C) (Oral)   Resp 14   Ht '5\' 4"'$  (1.626 m)   Wt 227 lb (103 kg)   SpO2 95%   BMI 38.96 kg/m?   ? ?Physical Exam:  ?Physical Exam ?Vitals reviewed.  ?Constitutional:   ?   Appearance: Normal appearance.  ?Skin: ?   General: Skin is warm and dry.  ?   Comments: Right posterior incision site healing at the medial aspect with 1.5 cm opening at the lateral aspect, minimal fibrinous exudate noted within wound opening; midline backing station still healing with more cloudy yellow drainage from the incision site, sutures still in place, mild erythema around both incision sites   ?Neurological:  ?   Mental Status: She is alert.  ? ?Laboratory studies: None  ? ?Imaging:  ?None ? ?Assessment:  ?66 y.o. yo Female who presents status post office excision of right posterior shoulder mass and midline back mass on 3/13 with dehiscence of her right shoulder incision site. ? ?Plan:  ?-Right posterior shoulder incision site with 1.5-2 cm wound dehiscence.  No purulent drainage noted the area.  The wound was packed with 1 inch packing ?-Advised the patient to have her husband pack the open wound daily with 1 inch packing, keep the area covered while showering, and change the packing if he gets saturated ?-Sutures were left in  place on the midline back incision given that it still appears that is healing.  Since his incision appears to have more cloudy drainage at this time, prescription for Keflex for 7 days was provided ?-Apply antibiotic ointment to midline back incision ?-Explained to the patient that at our next visit in 1 week, we will need to remove the midline back incision stitches, and manage if the area develops into an open wound ?-Follow up with me on 4/11 ? ?All of the above recommendations were discussed with the patient, and all of patient's questions were answered to her expressed satisfaction. ? ?Graciella Freer, DO ?[facility] ?[place], [place] [address] ?[phone] (office) ?

## 2021-12-13 NOTE — Patient Instructions (Signed)
-  Pack your wound daily with 1 inch packing provided to you in office ?-May change the dressing more than once a day if saturated ?-Keep area covered in shower ?-May need to wet packing with removal ?-Apply antibiotic ointment to midline back incision site ?-Take Keflex as directed ?

## 2021-12-14 DIAGNOSIS — Z1211 Encounter for screening for malignant neoplasm of colon: Secondary | ICD-10-CM | POA: Diagnosis not present

## 2021-12-15 ENCOUNTER — Ambulatory Visit: Payer: Medicare Other | Admitting: Surgery

## 2021-12-20 ENCOUNTER — Encounter: Payer: Self-pay | Admitting: Surgery

## 2021-12-20 ENCOUNTER — Ambulatory Visit (INDEPENDENT_AMBULATORY_CARE_PROVIDER_SITE_OTHER): Payer: Medicare Other | Admitting: Surgery

## 2021-12-20 ENCOUNTER — Other Ambulatory Visit: Payer: Self-pay

## 2021-12-20 ENCOUNTER — Ambulatory Visit: Payer: Medicare Other | Admitting: Surgery

## 2021-12-20 VITALS — BP 152/80 | HR 73 | Temp 98.1°F | Resp 18 | Ht 64.0 in | Wt 225.0 lb

## 2021-12-20 DIAGNOSIS — Z09 Encounter for follow-up examination after completed treatment for conditions other than malignant neoplasm: Secondary | ICD-10-CM

## 2021-12-20 DIAGNOSIS — T8131XA Disruption of external operation (surgical) wound, not elsewhere classified, initial encounter: Secondary | ICD-10-CM

## 2021-12-20 NOTE — Patient Instructions (Signed)
-  Continue with daily dressing changes to the right shoulder wound ?-Keep the areas covered when bathing ?

## 2021-12-21 NOTE — Progress Notes (Signed)
[facility] Surgical Clinic Note  ? ?HPI:  ?66 y.o. Female presents to clinic for post-op follow-up status post excision of right posterior shoulder mass and midline back mass in office on 3/13, with small dehiscence of right posterior shoulder incision site.  She has been doing well without significant complaints.  She denies fevers and chills.  She denies significant drainage from her right posterior shoulder site.  She has a small amount of brownish drainage from her midline back incision.  She is tolerating her antibiotic without nausea and vomiting. ? ?Review of Systems:  ?All other review of systems: otherwise negative  ? ?Vital Signs:  ?BP (!) 152/80   Pulse 73   Temp 98.1 ?F (36.7 ?C) (Oral)   Resp 18   Ht '5\' 4"'$  (1.626 m)   Wt 225 lb (102.1 kg)   SpO2 98%   BMI 38.62 kg/m?   ? ?Physical Exam:  ?Physical Exam ?Vitals reviewed.  ?Constitutional:   ?   Appearance: Normal appearance.  ?Skin: ?   General: Skin is warm and dry.  ?   Comments: Right posterior shoulder incision site healing well on medial aspect, with 1.5 cm opening, granulation tissue at the base; midline back incision with minimal drainage, erythema from sutures, no evidence of infection ?  ?Neurological:  ?   Mental Status: She is alert.  ? ?Laboratory studies: None ? ?Imaging:  ?None ? ?Assessment:  ?66 y.o. yo Female who presents for follow-up status post excision of right posterior shoulder mass and midline back mass on 3/13 with small wound dehiscence of the right posterior shoulder incision site ? ?Plan:  ?-No evidence of infection at either incision site ?-Patient will continue to take her antibiotic, until she completes her course ?-Sutures removed from midline back incision, and Steri-Strips applied to help promote small opening to close ?-Continue packing to right posterior shoulder incision site ?-Once this area has minimal drainage, will evaluate for placement of stitches to close the incision ?-Follow up 4/25 ? ?All of the  above recommendations were discussed with the patient, and all of patient's questions were answered to her expressed satisfaction. ? ?Graciella Freer, DO ?[facility] ?[place], [place] [address] ?[phone] (office) ? ?

## 2022-01-03 ENCOUNTER — Other Ambulatory Visit: Payer: Self-pay

## 2022-01-03 ENCOUNTER — Ambulatory Visit: Payer: Medicare Other | Admitting: Surgery

## 2022-01-03 ENCOUNTER — Encounter: Payer: Self-pay | Admitting: Surgery

## 2022-01-03 VITALS — BP 154/82 | HR 64 | Temp 97.6°F | Resp 16 | Ht 64.0 in | Wt 225.0 lb

## 2022-01-03 DIAGNOSIS — T8131XA Disruption of external operation (surgical) wound, not elsewhere classified, initial encounter: Secondary | ICD-10-CM

## 2022-01-03 NOTE — Progress Notes (Signed)
[facility] Surgical Clinic Note  ? ?HPI:  ?66 y.o. Female presents to clinic for postop low up status post excision of right posterior shoulder mass and midline back mass in office on 3/13 with small dehiscence of right posterior shoulder incision site.  She has been doing well since her last evaluation, and states that her husband is unable to pack her right shoulder site, as it is started to heal closed.  She denies any opening of her midline incision site.  She denies any fevers or chills.  She is tolerating a diet without nausea and vomiting. ? ?Review of Systems:  ?All other review of systems: otherwise negative  ? ?Vital Signs:  ?BP (!) 154/82   Pulse 64   Temp 97.6 ?F (36.4 ?C) (Oral)   Resp 16   Ht '5\' 4"'$  (1.626 m)   Wt 225 lb (102.1 kg)   SpO2 96%   BMI 38.62 kg/m?   ? ?Physical Exam:  ?Physical Exam ?Vitals reviewed.  ?Constitutional:   ?   Appearance: Normal appearance.  ?Skin: ?   General: Skin is warm and dry.  ?   Comments: Right posterior shoulder incision site healing well with scab at the lateral aspect of the incision; midline back incision with Steri-Strips still in place, Steri-Strips removed with 2 punctate areas open, no drainage noted, no erythema, induration, or fluctuance  ?Neurological:  ?   Mental Status: She is alert.  ? ? ?Laboratory studies: None ? ?Imaging:  ?None ? ?Assessment:  ?66 y.o. yo Female who presents for follow-up status post excision of right posterior shoulder mass and midline back mass on 3/13 with small wound dehiscence of the right posterior shoulder incision site ? ?Plan:  ?-Right posterior shoulder incision site with scab over the previous dehiscence site.  We will let this area heal on its own, and packing no longer required ?-2 punctate openings in the midline back incision site were treated with silver nitrate ?-Advised the patient to keep these areas covered to prevent any drainage from getting on her clothing, however they should heal without  significant issue ?-We will have the patient follow-up with me in 3 weeks to verify that all of these areas have completely healed ? ?All of the above recommendations were discussed with the patient, and all of patient's questions were answered to her expressed satisfaction. ? ?Graciella Freer, DO ?[facility] ?[place], [place] [address] ?[phone] (office) ?

## 2022-01-03 NOTE — Patient Instructions (Signed)
-  Keep the areas covered ?-No need for packing ? ?

## 2022-01-31 ENCOUNTER — Ambulatory Visit: Payer: Medicare Other | Admitting: Surgery

## 2022-01-31 DIAGNOSIS — G245 Blepharospasm: Secondary | ICD-10-CM | POA: Diagnosis not present

## 2022-02-16 DIAGNOSIS — E538 Deficiency of other specified B group vitamins: Secondary | ICD-10-CM | POA: Diagnosis not present

## 2022-02-16 DIAGNOSIS — K2100 Gastro-esophageal reflux disease with esophagitis, without bleeding: Secondary | ICD-10-CM | POA: Diagnosis not present

## 2022-02-16 DIAGNOSIS — G4733 Obstructive sleep apnea (adult) (pediatric): Secondary | ICD-10-CM | POA: Diagnosis not present

## 2022-02-16 DIAGNOSIS — I10 Essential (primary) hypertension: Secondary | ICD-10-CM | POA: Diagnosis not present

## 2022-02-16 DIAGNOSIS — M1991 Primary osteoarthritis, unspecified site: Secondary | ICD-10-CM | POA: Diagnosis not present

## 2022-02-16 DIAGNOSIS — E1165 Type 2 diabetes mellitus with hyperglycemia: Secondary | ICD-10-CM | POA: Diagnosis not present

## 2022-05-08 DIAGNOSIS — G245 Blepharospasm: Secondary | ICD-10-CM | POA: Diagnosis not present

## 2022-05-09 DIAGNOSIS — E785 Hyperlipidemia, unspecified: Secondary | ICD-10-CM | POA: Diagnosis not present

## 2022-05-09 DIAGNOSIS — I10 Essential (primary) hypertension: Secondary | ICD-10-CM | POA: Diagnosis not present

## 2022-05-09 DIAGNOSIS — E119 Type 2 diabetes mellitus without complications: Secondary | ICD-10-CM | POA: Diagnosis not present

## 2022-05-19 DIAGNOSIS — E1142 Type 2 diabetes mellitus with diabetic polyneuropathy: Secondary | ICD-10-CM | POA: Diagnosis not present

## 2022-05-19 DIAGNOSIS — E119 Type 2 diabetes mellitus without complications: Secondary | ICD-10-CM | POA: Diagnosis not present

## 2022-05-19 DIAGNOSIS — Z23 Encounter for immunization: Secondary | ICD-10-CM | POA: Diagnosis not present

## 2022-05-19 DIAGNOSIS — I10 Essential (primary) hypertension: Secondary | ICD-10-CM | POA: Diagnosis not present

## 2022-06-10 DIAGNOSIS — I10 Essential (primary) hypertension: Secondary | ICD-10-CM | POA: Diagnosis not present

## 2022-06-10 DIAGNOSIS — E119 Type 2 diabetes mellitus without complications: Secondary | ICD-10-CM | POA: Diagnosis not present

## 2022-06-29 DIAGNOSIS — E538 Deficiency of other specified B group vitamins: Secondary | ICD-10-CM | POA: Diagnosis not present

## 2022-07-31 DIAGNOSIS — E538 Deficiency of other specified B group vitamins: Secondary | ICD-10-CM | POA: Diagnosis not present

## 2022-08-07 DIAGNOSIS — G245 Blepharospasm: Secondary | ICD-10-CM | POA: Diagnosis not present

## 2022-08-09 ENCOUNTER — Other Ambulatory Visit (HOSPITAL_COMMUNITY): Payer: Self-pay | Admitting: Internal Medicine

## 2022-08-09 DIAGNOSIS — Z1231 Encounter for screening mammogram for malignant neoplasm of breast: Secondary | ICD-10-CM

## 2022-08-21 DIAGNOSIS — I10 Essential (primary) hypertension: Secondary | ICD-10-CM | POA: Diagnosis not present

## 2022-08-21 DIAGNOSIS — E1142 Type 2 diabetes mellitus with diabetic polyneuropathy: Secondary | ICD-10-CM | POA: Diagnosis not present

## 2022-08-21 DIAGNOSIS — E119 Type 2 diabetes mellitus without complications: Secondary | ICD-10-CM | POA: Diagnosis not present

## 2022-08-31 DIAGNOSIS — E538 Deficiency of other specified B group vitamins: Secondary | ICD-10-CM | POA: Diagnosis not present

## 2022-09-25 ENCOUNTER — Inpatient Hospital Stay (HOSPITAL_COMMUNITY): Admission: RE | Admit: 2022-09-25 | Payer: Medicare Other | Source: Ambulatory Visit

## 2022-10-02 DIAGNOSIS — E538 Deficiency of other specified B group vitamins: Secondary | ICD-10-CM | POA: Diagnosis not present

## 2022-10-16 DIAGNOSIS — I10 Essential (primary) hypertension: Secondary | ICD-10-CM | POA: Diagnosis not present

## 2022-10-16 DIAGNOSIS — E119 Type 2 diabetes mellitus without complications: Secondary | ICD-10-CM | POA: Diagnosis not present

## 2022-10-27 DIAGNOSIS — E1165 Type 2 diabetes mellitus with hyperglycemia: Secondary | ICD-10-CM | POA: Diagnosis not present

## 2022-10-27 DIAGNOSIS — N390 Urinary tract infection, site not specified: Secondary | ICD-10-CM | POA: Diagnosis not present

## 2022-10-27 DIAGNOSIS — I10 Essential (primary) hypertension: Secondary | ICD-10-CM | POA: Diagnosis not present

## 2022-10-27 DIAGNOSIS — E1142 Type 2 diabetes mellitus with diabetic polyneuropathy: Secondary | ICD-10-CM | POA: Diagnosis not present

## 2022-10-31 DIAGNOSIS — G245 Blepharospasm: Secondary | ICD-10-CM | POA: Diagnosis not present

## 2022-11-22 DIAGNOSIS — E782 Mixed hyperlipidemia: Secondary | ICD-10-CM | POA: Diagnosis not present

## 2022-11-22 DIAGNOSIS — D518 Other vitamin B12 deficiency anemias: Secondary | ICD-10-CM | POA: Diagnosis not present

## 2022-11-22 DIAGNOSIS — E559 Vitamin D deficiency, unspecified: Secondary | ICD-10-CM | POA: Diagnosis not present

## 2022-11-22 DIAGNOSIS — I10 Essential (primary) hypertension: Secondary | ICD-10-CM | POA: Diagnosis not present

## 2022-11-22 DIAGNOSIS — E1165 Type 2 diabetes mellitus with hyperglycemia: Secondary | ICD-10-CM | POA: Diagnosis not present

## 2022-11-22 DIAGNOSIS — R011 Cardiac murmur, unspecified: Secondary | ICD-10-CM | POA: Diagnosis not present

## 2022-11-22 DIAGNOSIS — E1142 Type 2 diabetes mellitus with diabetic polyneuropathy: Secondary | ICD-10-CM | POA: Diagnosis not present

## 2022-11-22 DIAGNOSIS — G9332 Myalgic encephalomyelitis/chronic fatigue syndrome: Secondary | ICD-10-CM | POA: Diagnosis not present

## 2022-11-22 DIAGNOSIS — Z0001 Encounter for general adult medical examination with abnormal findings: Secondary | ICD-10-CM | POA: Diagnosis not present

## 2022-11-22 DIAGNOSIS — Z136 Encounter for screening for cardiovascular disorders: Secondary | ICD-10-CM | POA: Diagnosis not present

## 2022-11-23 ENCOUNTER — Other Ambulatory Visit (HOSPITAL_COMMUNITY): Payer: Self-pay | Admitting: Internal Medicine

## 2022-11-23 DIAGNOSIS — Z136 Encounter for screening for cardiovascular disorders: Secondary | ICD-10-CM

## 2022-12-04 ENCOUNTER — Ambulatory Visit (HOSPITAL_COMMUNITY)
Admission: RE | Admit: 2022-12-04 | Discharge: 2022-12-04 | Disposition: A | Discharge status: Home / Self Care | Payer: Medicare Other | Source: Ambulatory Visit | Attending: Internal Medicine | Admitting: Internal Medicine

## 2022-12-04 DIAGNOSIS — Z136 Encounter for screening for cardiovascular disorders: Secondary | ICD-10-CM | POA: Diagnosis not present

## 2022-12-04 DIAGNOSIS — I6523 Occlusion and stenosis of bilateral carotid arteries: Secondary | ICD-10-CM | POA: Diagnosis not present

## 2022-12-21 DIAGNOSIS — Z1211 Encounter for screening for malignant neoplasm of colon: Secondary | ICD-10-CM | POA: Diagnosis not present

## 2022-12-25 ENCOUNTER — Encounter: Payer: Self-pay | Admitting: *Deleted

## 2022-12-26 ENCOUNTER — Ambulatory Visit: Payer: Medicare Other | Attending: Internal Medicine | Admitting: Internal Medicine

## 2022-12-26 ENCOUNTER — Encounter: Payer: Self-pay | Admitting: Internal Medicine

## 2022-12-26 VITALS — BP 142/78 | HR 72 | Ht 64.0 in | Wt 220.8 lb

## 2022-12-26 DIAGNOSIS — R011 Cardiac murmur, unspecified: Secondary | ICD-10-CM | POA: Diagnosis not present

## 2022-12-26 DIAGNOSIS — I10 Essential (primary) hypertension: Secondary | ICD-10-CM

## 2022-12-26 NOTE — Progress Notes (Signed)
Cardiology Office Note  Date: 12/26/2022   ID: Aimee Reed, Aimee Reed 12/14/1955, MRN [id number]  PCP:  Elfredia Nevins, MD  Cardiologist:  Marjo Bicker, MD Electrophysiologist:  None   Reason for Office Visit: Evaluation the request Dr. Carlena Sax   History of Present Illness: Aimee Reed is a 67 y.o. female known to have HTN, DM 2, HLD, OSA was referred to cardiology clinic for evaluation of murmur.  Patient denies any angina, DOE, dizziness, syncope, palpitations or leg swelling.  No smoking, alcohol or illicit drug use.  Compliant with medications and has no side effects.  Past Medical History:  Diagnosis Date   Anxiety    Blepharospasm    Depression    Diabetes mellitus, type II    GERD (gastroesophageal reflux disease)    HTN (hypertension)    Hyperlipidemia 03/11/2012   Irritable bowel syndrome    Overactive bladder    Recurrent UTI    chronic macrodantin   Sleep apnea     Past Surgical History:  Procedure Laterality Date   BIOPSY  11/18/2020   Procedure: BIOPSY;  Surgeon: Corbin Ade, MD;  [facility]: [facility];  Service: Endoscopy;;   CARPAL TUNNEL RELEASE     both hands   CHOLECYSTECTOMY     COLONOSCOPY     Dr. Lovell Sheehan, around 2010   COLONOSCOPY N/A 02/25/2013   BJY:NWGNFAO polyps, next tcs 02/2018 give tubular adenomas   COLONOSCOPY WITH PROPOFOL N/A 05/05/2021   Procedure: COLONOSCOPY WITH PROPOFOL;  Surgeon: Corbin Ade, MD;  Location: [facility];  Service: Endoscopy;  Laterality: N/A;  7:30am   ESOPHAGOGASTRODUODENOSCOPY  07/31/2011   ZHY:QMVHQI cervical esophageal web s/p dilation,query short segment Barrett's esophagus s/p bx (Barrett's esophagus without dysplasia),small hiatal hernia. 56 French Maloney dilator.   ESOPHAGOGASTRODUODENOSCOPY (EGD) WITH ESOPHAGEAL DILATION N/A 02/25/2013   ONG:EXBMWUXL distal esophagus/HH. next EGD 02/2016 given prior h/o Barrett's   ESOPHAGOGASTRODUODENOSCOPY (EGD) WITH PROPOFOL N/A 11/18/2020    Barrett's esophagus biopsy confirmed. no esophageal stricture s/p dilation for history of dysphagia, small hiatal hernia.   EYE SURGERY  07/13/2015   Eyelids, nerve issues   MALONEY DILATION N/A 11/18/2020   Procedure: MALONEY DILATION;  Surgeon: Corbin Ade, MD;  Location: [facility];  Service: Endoscopy;  Laterality: N/A;   POLYPECTOMY  05/05/2021   Procedure: POLYPECTOMY;  Surgeon: Corbin Ade, MD;  Location: [facility];  Service: Endoscopy;;    Current Outpatient Medications  Medication Sig Dispense Refill   amLODipine (NORVASC) 5 MG tablet Take 5 mg by mouth daily.     Apple Cider Vinegar 500 MG TABS Take 500 mg by mouth 2 (two) times daily.     ascorbic acid (VITAMIN C) 500 MG tablet Take 500 mg by mouth daily.     aspirin EC 81 MG tablet Take 81 mg by mouth at bedtime.     Carboxymethylcellulose Sodium (THERATEARS) 0.25 % SOLN Place 1 drop into both eyes daily.     CINNAMON PO Take 1,200 mg by mouth in the morning and at bedtime.     DULoxetine (CYMBALTA) 60 MG capsule Take 60 mg by mouth daily.     ferrous sulfate 325 (65 FE) MG EC tablet Take 325 mg by mouth daily with breakfast.     loratadine (CLARITIN) 10 MG tablet Take 10 mg by mouth daily.     losartan (COZAAR) 100 MG tablet Take 100 mg by mouth daily.     meloxicam (MOBIC) 7.5  MG tablet Take 7.5 mg by mouth daily.     Multiple Vitamins-Minerals (HAIR SKIN & NAILS) TABS Take 1 tablet by mouth 3 (three) times daily.     NON FORMULARY Take 2 tablets by mouth daily. [employer] Shrooms 2400 mg     omeprazole (PRILOSEC) 40 MG capsule Take 40 mg by mouth 2 (two) times daily.     OnabotulinumtoxinA (BOTOX IJ) Inject as directed. Every 3 months 6 injection around each eye     oxybutynin (DITROPAN) 5 MG tablet Take 5 mg by mouth 2 (two) times daily.     OZEMPIC, 0.25 OR 0.5 MG/DOSE, 2 MG/3ML SOPN Inject 0.5 mg into the skin once a week.     propranolol (INDERAL) 10 MG tablet Take 20 mg by mouth in the morning. & 10 mg  at night     rosuvastatin (CRESTOR) 5 MG tablet Take 5 mg by mouth every Saturday.     sitaGLIPtin-metformin (JANUMET) 50-1000 MG tablet Take 1 tablet by mouth 2 (two) times daily with a meal.     zolpidem (AMBIEN) 5 MG tablet 2.5- 5 mg at night as needed for sleep (Patient taking differently: Take 5 mg by mouth at bedtime as needed.) 90 tablet 0   No current facility-administered medications for this visit.   Allergies:  Patient has no known allergies.   Social History: The patient  reports that she has never smoked. She has never used smokeless tobacco. She reports that she does not drink alcohol and does not use drugs.   Family History: The patient's family history includes Alzheimer's disease in her brother, maternal grandmother, and mother; Cancer in her sister; Dementia in her brother, maternal grandmother, and mother; Depression in her father; Diabetes in her paternal grandfather; Other in her brother and paternal grandmother; Ulcers in her mother.   ROS:  Please see the history of present illness. Otherwise, complete review of systems is positive for none.  All other systems are reviewed and negative.   Physical Exam: VS:  BP (!) 142/78   Pulse 72   Ht 5\' 4"  (1.626 m)   Wt 220 lb 12.8 oz (100.2 kg)   SpO2 95%   BMI 37.90 kg/m , BMI Body mass index is 37.9 kg/m.  Wt Readings from Last 3 Encounters:  12/26/22 220 lb 12.8 oz (100.2 kg)  01/03/22 225 lb (102.1 kg)  12/20/21 225 lb (102.1 kg)    General: Patient appears comfortable at rest. HEENT: Conjunctiva and lids normal, oropharynx clear with moist mucosa. Neck: Supple, no elevated JVP or carotid bruits, no thyromegaly. Lungs: Clear to auscultation, nonlabored breathing at rest. Cardiac: Regular rate and rhythm, no S3.  Systolic murmur is present. Abdomen: Soft, nontender, no hepatomegaly, bowel sounds present, no guarding or rebound. Extremities: No pitting edema, distal pulses 2+. Skin: Warm and dry. Musculoskeletal:  No kyphosis. Neuropsychiatric: Alert and oriented x3, affect grossly appropriate.  ECG:  NSR  Recent Labwork: No results found for requested labs within last 365 days.     Component Value Date/Time   CHOL 217 (H) 04/30/2017 0739   TRIG 149 04/30/2017 0739   HDL 56 04/30/2017 0739   CHOLHDL 3.9 04/30/2017 0739   LDLCALC 131 (H) 04/30/2017 0739    Other Studies Reviewed Today:   Assessment and Plan: Patient is a 67 year old F known to have HTN, DM2, HLD, OSA was referred to cardiology clinic for evaluation of murmur.  # Systolic murmur: Obtain 2D echocardiogram # HTN, partially controlled: Continue current  antihypertensives, HTN per PCP, continue Eliquis 5 mg once daily, losartan 100 mg once daily. # HLD: Continue rosuvastatin 5 mg (every Saturday), management per PCP.  Goal LDL less than.   Medication Adjustments/Labs and Tests Ordered: Current medicines are reviewed at length with the patient today.  Concerns regarding medicines are outlined above.   Tests Ordered: Orders Placed This Encounter  Procedures   EKG 12-Lead    Medication Changes: No orders of the defined types were placed in this encounter.   Disposition:  Follow up  pending results  Signed Kensleigh Gates Verne Spurr, MD, 12/26/2022 3:15 PM    [facility] [address] [place], [place], [place] [address]

## 2022-12-26 NOTE — Patient Instructions (Addendum)
Medication Instructions:  Your physician recommends that you continue on your current medications as directed. Please refer to the Current Medication list given to you today.  Labwork: none  Testing/Procedures: Your physician has requested that you have an echocardiogram. Echocardiography is a painless test that uses sound waves to create images of your heart. It provides your doctor with information about the size and shape of your heart and how well your heart's chambers and valves are working. This procedure takes approximately one hour. There are no restrictions for this procedure. Please do NOT wear cologne, perfume, aftershave, or lotions (deodorant is allowed). Please arrive 15 minutes prior to your appointment time.  Follow-Up: Your physician recommends that you schedule a follow-up appointment in: pending  Any Other Special Instructions Will Be Listed Below (If Applicable).  If you need a refill on your cardiac medications before your next appointment, please call your pharmacy. 

## 2022-12-28 DIAGNOSIS — D518 Other vitamin B12 deficiency anemias: Secondary | ICD-10-CM | POA: Diagnosis not present

## 2023-01-18 ENCOUNTER — Ambulatory Visit: Payer: Medicare Other | Attending: Internal Medicine

## 2023-01-18 DIAGNOSIS — R011 Cardiac murmur, unspecified: Secondary | ICD-10-CM

## 2023-01-19 DIAGNOSIS — I10 Essential (primary) hypertension: Secondary | ICD-10-CM | POA: Diagnosis not present

## 2023-01-19 DIAGNOSIS — M65331 Trigger finger, right middle finger: Secondary | ICD-10-CM | POA: Diagnosis not present

## 2023-01-19 DIAGNOSIS — E1165 Type 2 diabetes mellitus with hyperglycemia: Secondary | ICD-10-CM | POA: Diagnosis not present

## 2023-01-19 DIAGNOSIS — L255 Unspecified contact dermatitis due to plants, except food: Secondary | ICD-10-CM | POA: Diagnosis not present

## 2023-01-19 LAB — ECHOCARDIOGRAM COMPLETE
AR max vel: 2.16 cm2
AV Area VTI: 2.55 cm2
AV Area mean vel: 2.35 cm2
AV Mean grad: 5 mmHg
AV Peak grad: 10.2 mmHg
Ao pk vel: 1.6 m/s
Area-P 1/2: 2.96 cm2
Calc EF: 65.4 %
S' Lateral: 2.1 cm
Single Plane A2C EF: 61.4 %
Single Plane A4C EF: 68.6 %

## 2023-01-23 ENCOUNTER — Encounter: Payer: Self-pay | Admitting: *Deleted

## 2023-01-30 DIAGNOSIS — H1013 Acute atopic conjunctivitis, bilateral: Secondary | ICD-10-CM | POA: Diagnosis not present

## 2023-01-30 DIAGNOSIS — G245 Blepharospasm: Secondary | ICD-10-CM | POA: Diagnosis not present

## 2023-02-23 DIAGNOSIS — N390 Urinary tract infection, site not specified: Secondary | ICD-10-CM | POA: Diagnosis not present

## 2023-02-23 DIAGNOSIS — D518 Other vitamin B12 deficiency anemias: Secondary | ICD-10-CM | POA: Diagnosis not present

## 2023-02-23 DIAGNOSIS — E1165 Type 2 diabetes mellitus with hyperglycemia: Secondary | ICD-10-CM | POA: Diagnosis not present

## 2023-02-23 DIAGNOSIS — N3281 Overactive bladder: Secondary | ICD-10-CM | POA: Diagnosis not present

## 2023-02-23 DIAGNOSIS — I10 Essential (primary) hypertension: Secondary | ICD-10-CM | POA: Diagnosis not present

## 2023-03-21 DIAGNOSIS — I10 Essential (primary) hypertension: Secondary | ICD-10-CM | POA: Diagnosis not present

## 2023-04-05 DIAGNOSIS — D518 Other vitamin B12 deficiency anemias: Secondary | ICD-10-CM | POA: Diagnosis not present

## 2023-04-30 DIAGNOSIS — G245 Blepharospasm: Secondary | ICD-10-CM | POA: Diagnosis not present

## 2023-05-17 DIAGNOSIS — E538 Deficiency of other specified B group vitamins: Secondary | ICD-10-CM | POA: Diagnosis not present

## 2023-05-17 DIAGNOSIS — Z23 Encounter for immunization: Secondary | ICD-10-CM | POA: Diagnosis not present

## 2023-06-29 DIAGNOSIS — D518 Other vitamin B12 deficiency anemias: Secondary | ICD-10-CM | POA: Diagnosis not present

## 2023-07-30 DIAGNOSIS — G245 Blepharospasm: Secondary | ICD-10-CM | POA: Diagnosis not present

## 2023-07-30 DIAGNOSIS — D518 Other vitamin B12 deficiency anemias: Secondary | ICD-10-CM | POA: Diagnosis not present

## 2023-08-11 DIAGNOSIS — E1142 Type 2 diabetes mellitus with diabetic polyneuropathy: Secondary | ICD-10-CM | POA: Diagnosis not present

## 2023-08-11 DIAGNOSIS — E782 Mixed hyperlipidemia: Secondary | ICD-10-CM | POA: Diagnosis not present

## 2023-08-28 DIAGNOSIS — N390 Urinary tract infection, site not specified: Secondary | ICD-10-CM | POA: Diagnosis not present

## 2023-08-28 DIAGNOSIS — I10 Essential (primary) hypertension: Secondary | ICD-10-CM | POA: Diagnosis not present

## 2023-08-28 DIAGNOSIS — E1142 Type 2 diabetes mellitus with diabetic polyneuropathy: Secondary | ICD-10-CM | POA: Diagnosis not present

## 2023-08-28 DIAGNOSIS — S0990XA Unspecified injury of head, initial encounter: Secondary | ICD-10-CM | POA: Diagnosis not present

## 2023-08-28 DIAGNOSIS — E538 Deficiency of other specified B group vitamins: Secondary | ICD-10-CM | POA: Diagnosis not present

## 2023-08-28 DIAGNOSIS — G4733 Obstructive sleep apnea (adult) (pediatric): Secondary | ICD-10-CM | POA: Diagnosis not present

## 2023-09-11 DIAGNOSIS — E1142 Type 2 diabetes mellitus with diabetic polyneuropathy: Secondary | ICD-10-CM | POA: Diagnosis not present

## 2023-10-04 DIAGNOSIS — E538 Deficiency of other specified B group vitamins: Secondary | ICD-10-CM | POA: Diagnosis not present

## 2023-10-12 DIAGNOSIS — I10 Essential (primary) hypertension: Secondary | ICD-10-CM | POA: Diagnosis not present

## 2023-10-12 DIAGNOSIS — E1142 Type 2 diabetes mellitus with diabetic polyneuropathy: Secondary | ICD-10-CM | POA: Diagnosis not present

## 2023-10-12 DIAGNOSIS — E782 Mixed hyperlipidemia: Secondary | ICD-10-CM | POA: Diagnosis not present

## 2023-10-13 ENCOUNTER — Encounter (HOSPITAL_COMMUNITY): Payer: Self-pay | Admitting: *Deleted

## 2023-10-13 ENCOUNTER — Emergency Department (HOSPITAL_COMMUNITY): Payer: Medicare Other

## 2023-10-13 ENCOUNTER — Other Ambulatory Visit: Payer: Self-pay

## 2023-10-13 ENCOUNTER — Emergency Department (HOSPITAL_COMMUNITY)
Admission: EM | Admit: 2023-10-13 | Discharge: 2023-10-13 | Disposition: A | Discharge status: Home / Self Care | Payer: Medicare Other | Attending: Emergency Medicine | Admitting: Emergency Medicine

## 2023-10-13 DIAGNOSIS — Y92410 Unspecified street and highway as the place of occurrence of the external cause: Secondary | ICD-10-CM | POA: Insufficient documentation

## 2023-10-13 DIAGNOSIS — G8911 Acute pain due to trauma: Secondary | ICD-10-CM | POA: Insufficient documentation

## 2023-10-13 DIAGNOSIS — Z7984 Long term (current) use of oral hypoglycemic drugs: Secondary | ICD-10-CM | POA: Insufficient documentation

## 2023-10-13 DIAGNOSIS — I10 Essential (primary) hypertension: Secondary | ICD-10-CM | POA: Diagnosis not present

## 2023-10-13 DIAGNOSIS — Z794 Long term (current) use of insulin: Secondary | ICD-10-CM | POA: Insufficient documentation

## 2023-10-13 DIAGNOSIS — E119 Type 2 diabetes mellitus without complications: Secondary | ICD-10-CM | POA: Insufficient documentation

## 2023-10-13 DIAGNOSIS — M5442 Lumbago with sciatica, left side: Secondary | ICD-10-CM | POA: Insufficient documentation

## 2023-10-13 DIAGNOSIS — M545 Low back pain, unspecified: Secondary | ICD-10-CM | POA: Diagnosis present

## 2023-10-13 DIAGNOSIS — Z7982 Long term (current) use of aspirin: Secondary | ICD-10-CM | POA: Insufficient documentation

## 2023-10-13 DIAGNOSIS — M438X6 Other specified deforming dorsopathies, lumbar region: Secondary | ICD-10-CM | POA: Diagnosis not present

## 2023-10-13 DIAGNOSIS — M48061 Spinal stenosis, lumbar region without neurogenic claudication: Secondary | ICD-10-CM | POA: Diagnosis not present

## 2023-10-13 DIAGNOSIS — M5136 Other intervertebral disc degeneration, lumbar region with discogenic back pain only: Secondary | ICD-10-CM | POA: Diagnosis not present

## 2023-10-13 DIAGNOSIS — Z041 Encounter for examination and observation following transport accident: Secondary | ICD-10-CM | POA: Diagnosis not present

## 2023-10-13 MED ORDER — PREDNISONE 50 MG PO TABS
60.0000 mg | ORAL_TABLET | Freq: Once | ORAL | Status: AC
  Administered 2023-10-13: 60 mg via ORAL
  Filled 2023-10-13: qty 1

## 2023-10-13 MED ORDER — ACETAMINOPHEN 500 MG PO TABS
1000.0000 mg | ORAL_TABLET | Freq: Once | ORAL | Status: AC
  Administered 2023-10-13: 1000 mg via ORAL
  Filled 2023-10-13: qty 2

## 2023-10-13 MED ORDER — LIDOCAINE 5 % EX PTCH
1.0000 | MEDICATED_PATCH | Freq: Once | CUTANEOUS | Status: DC
  Administered 2023-10-13: 1 via TRANSDERMAL
  Filled 2023-10-13: qty 1

## 2023-10-13 MED ORDER — LIDOCAINE 5 % EX PTCH: 1.0000 | MEDICATED_PATCH | CUTANEOUS | 0 refills | Status: DC
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
## 2023-10-13 NOTE — ED Notes (Signed)
Pt ambulated to ED room. Pt stated lower back is in severe pain. Denies pain radiating anywhere else. Full mobility seen in lower and upper extremities.
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
## 2023-10-16 ENCOUNTER — Encounter (INDEPENDENT_AMBULATORY_CARE_PROVIDER_SITE_OTHER): Payer: Self-pay | Admitting: *Deleted

## 2023-11-05 DIAGNOSIS — G245 Blepharospasm: Secondary | ICD-10-CM | POA: Diagnosis not present

## 2023-11-05 DIAGNOSIS — E119 Type 2 diabetes mellitus without complications: Secondary | ICD-10-CM | POA: Diagnosis not present

## 2023-11-09 DIAGNOSIS — E782 Mixed hyperlipidemia: Secondary | ICD-10-CM | POA: Diagnosis not present

## 2023-11-09 DIAGNOSIS — E1142 Type 2 diabetes mellitus with diabetic polyneuropathy: Secondary | ICD-10-CM | POA: Diagnosis not present

## 2024-01-09 DIAGNOSIS — E1142 Type 2 diabetes mellitus with diabetic polyneuropathy: Secondary | ICD-10-CM | POA: Diagnosis not present

## 2024-01-09 DIAGNOSIS — E782 Mixed hyperlipidemia: Secondary | ICD-10-CM | POA: Diagnosis not present

## 2024-01-09 DIAGNOSIS — I10 Essential (primary) hypertension: Secondary | ICD-10-CM | POA: Diagnosis not present

## 2024-01-28 DIAGNOSIS — G245 Blepharospasm: Secondary | ICD-10-CM | POA: Diagnosis not present

## 2024-01-29 DIAGNOSIS — E538 Deficiency of other specified B group vitamins: Secondary | ICD-10-CM | POA: Diagnosis not present

## 2024-02-09 DIAGNOSIS — G4733 Obstructive sleep apnea (adult) (pediatric): Secondary | ICD-10-CM | POA: Diagnosis not present

## 2024-02-09 DIAGNOSIS — E1142 Type 2 diabetes mellitus with diabetic polyneuropathy: Secondary | ICD-10-CM | POA: Diagnosis not present

## 2024-02-09 DIAGNOSIS — E782 Mixed hyperlipidemia: Secondary | ICD-10-CM | POA: Diagnosis not present

## 2024-03-03 DIAGNOSIS — N3281 Overactive bladder: Secondary | ICD-10-CM | POA: Diagnosis not present

## 2024-03-03 DIAGNOSIS — I10 Essential (primary) hypertension: Secondary | ICD-10-CM | POA: Diagnosis not present

## 2024-03-03 DIAGNOSIS — N390 Urinary tract infection, site not specified: Secondary | ICD-10-CM | POA: Diagnosis not present

## 2024-03-03 DIAGNOSIS — E538 Deficiency of other specified B group vitamins: Secondary | ICD-10-CM | POA: Diagnosis not present

## 2024-03-03 DIAGNOSIS — E782 Mixed hyperlipidemia: Secondary | ICD-10-CM | POA: Diagnosis not present

## 2024-03-03 DIAGNOSIS — E1142 Type 2 diabetes mellitus with diabetic polyneuropathy: Secondary | ICD-10-CM | POA: Diagnosis not present

## 2024-03-10 DIAGNOSIS — E782 Mixed hyperlipidemia: Secondary | ICD-10-CM | POA: Diagnosis not present

## 2024-03-10 DIAGNOSIS — E1142 Type 2 diabetes mellitus with diabetic polyneuropathy: Secondary | ICD-10-CM | POA: Diagnosis not present

## 2024-03-20 ENCOUNTER — Encounter (INDEPENDENT_AMBULATORY_CARE_PROVIDER_SITE_OTHER): Payer: Self-pay | Admitting: *Deleted

## 2024-04-03 DIAGNOSIS — E538 Deficiency of other specified B group vitamins: Secondary | ICD-10-CM | POA: Diagnosis not present

## 2024-04-15 DIAGNOSIS — M542 Cervicalgia: Secondary | ICD-10-CM | POA: Diagnosis not present

## 2024-05-06 DIAGNOSIS — E782 Mixed hyperlipidemia: Secondary | ICD-10-CM | POA: Diagnosis not present

## 2024-05-06 DIAGNOSIS — M542 Cervicalgia: Secondary | ICD-10-CM | POA: Diagnosis not present

## 2024-05-06 DIAGNOSIS — K2100 Gastro-esophageal reflux disease with esophagitis, without bleeding: Secondary | ICD-10-CM | POA: Diagnosis not present

## 2024-05-06 DIAGNOSIS — Z23 Encounter for immunization: Secondary | ICD-10-CM | POA: Diagnosis not present

## 2024-05-06 DIAGNOSIS — I10 Essential (primary) hypertension: Secondary | ICD-10-CM | POA: Diagnosis not present

## 2024-05-13 DIAGNOSIS — E538 Deficiency of other specified B group vitamins: Secondary | ICD-10-CM | POA: Diagnosis not present

## 2024-05-24 DIAGNOSIS — Z23 Encounter for immunization: Secondary | ICD-10-CM | POA: Diagnosis not present

## 2024-06-16 DIAGNOSIS — E538 Deficiency of other specified B group vitamins: Secondary | ICD-10-CM | POA: Diagnosis not present

## 2024-06-30 ENCOUNTER — Ambulatory Visit: Admitting: Internal Medicine

## 2024-06-30 ENCOUNTER — Encounter: Payer: Self-pay | Admitting: Internal Medicine

## 2024-06-30 VITALS — BP 139/79 | HR 73 | Temp 98.3°F | Ht 64.0 in | Wt 200.4 lb

## 2024-06-30 DIAGNOSIS — K22719 Barrett's esophagus with dysplasia, unspecified: Secondary | ICD-10-CM | POA: Diagnosis not present

## 2024-06-30 DIAGNOSIS — R197 Diarrhea, unspecified: Secondary | ICD-10-CM

## 2024-06-30 DIAGNOSIS — K219 Gastro-esophageal reflux disease without esophagitis: Secondary | ICD-10-CM | POA: Diagnosis not present

## 2024-06-30 NOTE — Patient Instructions (Addendum)
 It was nice to see you again today.  As discussed, you need to have a colonoscopy now given the large number of polyps removed 3 years ago(ASA 3-room 1 or 2 okay)  For your Barrett's esophagus, you should have a repeat upper endoscopy 2 years from now.  Continue omeprazole  twice daily for control of reflux.  Because of your recent diarrhea, we will do stool studies (GIP and C. difficile) prior to pursuing colonoscopy  You will need to stop your Ozempic 1 week prior to colonoscopy.

## 2024-06-30 NOTE — H&P (View-Only) (Signed)
 Gastroenterology Progress Note    Primary Care Physician:  Bertell Satterfield, MD Primary Gastroenterologist:  Dr. Shaaron  Pre-Procedure History & Physical: HPI:  Aimee Reed is a 68 y.o. female here for early interval colonoscopy.  Had 10 adenomas removed from her colon 3 years ago.  She is here to schedule surveillance exam.  Notes 1 month history of incessant nonbloody watery diarrhea multiple stools in the morning in contrast to going 3 to 4 days at times without a bowel movement.  No bleeding.  Does take antibiotics periodically for UTIs.  No abdominal pain short segment Barrett's diagnosed 2022 will be due for surveillance EGD 2027 reflux well-controlled on omeprazole  twice daily; she has been on Ozempic for over a year now has lost 30 pounds.  She reports her hemoglobin A1c is now 5.1.  Past Medical History:  Diagnosis Date   Anxiety    Blepharospasm    Depression    Diabetes mellitus, type II (HCC)    GERD (gastroesophageal reflux disease)    HTN (hypertension)    Hyperlipidemia 03/11/2012   Irritable bowel syndrome    Overactive bladder    Recurrent UTI    chronic macrodantin   Sleep apnea     Past Surgical History:  Procedure Laterality Date   BIOPSY  11/18/2020   Procedure: BIOPSY;  Surgeon: Shaaron Lamar CHRISTELLA, MD;  Location: [facility];  Service: Endoscopy;;   CARPAL TUNNEL RELEASE     both hands   CHOLECYSTECTOMY     COLONOSCOPY     Dr. Mavis, around 2010   COLONOSCOPY N/A 02/25/2013   MFM:rnonwpr polyps, next tcs 02/2018 give tubular adenomas   COLONOSCOPY WITH PROPOFOL  N/A 05/05/2021   Procedure: COLONOSCOPY WITH PROPOFOL ;  Surgeon: Shaaron Lamar CHRISTELLA, MD;  Location: [facility];  Service: Endoscopy;  Laterality: N/A;  7:30am   ESOPHAGOGASTRODUODENOSCOPY  07/31/2011   MFM:Nrrlou cervical esophageal web s/p dilation,query short segment Barrett's esophagus s/p bx (Barrett's esophagus without dysplasia),small hiatal hernia. 56 French Maloney dilator.    ESOPHAGOGASTRODUODENOSCOPY (EGD) WITH ESOPHAGEAL DILATION N/A 02/25/2013   MFM:jawnmfjo distal esophagus/HH. next EGD 02/2016 given prior h/o Barrett's   ESOPHAGOGASTRODUODENOSCOPY (EGD) WITH PROPOFOL  N/A 11/18/2020   Barrett's esophagus biopsy confirmed. no esophageal stricture s/p dilation for history of dysphagia, small hiatal hernia.   EYE SURGERY  07/13/2015   Eyelids, nerve issues   MALONEY DILATION N/A 11/18/2020   Procedure: MALONEY DILATION;  Surgeon: Shaaron Lamar CHRISTELLA, MD;  Location: [facility];  Service: Endoscopy;  Laterality: N/A;   POLYPECTOMY  05/05/2021   Procedure: POLYPECTOMY;  Surgeon: Shaaron Lamar CHRISTELLA, MD;  Location: [facility];  Service: Endoscopy;;    Prior to Admission medications   Medication Sig Start Date End Date Taking? Authorizing Provider  amLODipine (NORVASC) 10 MG tablet Take 10 mg by mouth daily. 04/24/24  Yes [provider]  Apple Cider Vinegar 500 MG TABS Take 500 mg by mouth 2 (two) times daily.   Yes [provider]  ascorbic acid (VITAMIN C) 500 MG tablet Take 500 mg by mouth daily.   Yes [provider]  aspirin EC 81 MG tablet Take 81 mg by mouth at bedtime.   Yes [provider]  Carboxymethylcellulose Sodium (THERATEARS) 0.25 % SOLN Place 1 drop into both eyes daily.   Yes [provider]  CINNAMON PO Take 1,200 mg by mouth in the morning and at bedtime.   Yes [provider]  cyclobenzaprine (FLEXERIL) 5 MG tablet  Take 5 mg by mouth 3 (three) times daily as needed. 05/23/24  Yes [provider]  DULoxetine  (CYMBALTA ) 60 MG capsule Take 60 mg by mouth daily.   Yes [provider]  ferrous sulfate 325 (65 FE) MG EC tablet Take 325 mg by mouth daily with breakfast.   Yes [provider]  loratadine (CLARITIN) 10 MG tablet Take 10 mg by mouth daily.   Yes [provider]  losartan (COZAAR) 100 MG tablet Take 100 mg by mouth daily.   Yes [provider]   meloxicam (MOBIC) 7.5 MG tablet Take 7.5 mg by mouth daily. 10/02/22  Yes [provider]  Multiple Vitamins-Minerals (HAIR SKIN & NAILS) TABS Take 1 tablet by mouth 3 (three) times daily.   Yes [provider]  NON FORMULARY Take 2 tablets by mouth daily. Lions Mane Shrooms 2400 mg   Yes [provider]  omeprazole  (PRILOSEC) 40 MG capsule Take 40 mg by mouth 2 (two) times daily. 04/21/21  Yes [provider]  OnabotulinumtoxinA (BOTOX IJ) Inject as directed. Every 3 months 6 injection around each eye   Yes [provider]  oxybutynin (DITROPAN) 5 MG tablet Take 5 mg by mouth 2 (two) times daily.   Yes [provider]  OZEMPIC, 0.25 OR 0.5 MG/DOSE, 2 MG/3ML SOPN Inject 0.5 mg into the skin once a week. 11/03/22  Yes [provider]  propranolol  (INDERAL ) 10 MG tablet Take 20 mg by mouth in the morning. & 10 mg at night   Yes [provider]  rosuvastatin (CRESTOR) 5 MG tablet Take 5 mg by mouth every Saturday.   Yes [provider]  sitaGLIPtin -metformin  (JANUMET ) 50-1000 MG tablet Take 1 tablet by mouth 2 (two) times daily with a meal.   Yes [provider]  zolpidem  (AMBIEN ) 5 MG tablet 2.5- 5 mg at night as needed for sleep Patient taking differently: Take 5 mg by mouth at bedtime as needed. 07/25/18  Yes Aimee Mettle, MD    Allergies as of 06/30/2024   (No Known Allergies)    Family History  Problem Relation Age of Onset   Depression Father    Ulcers Mother    Alzheimer's disease Mother    Dementia Mother    Alzheimer's disease Maternal Grandmother    Dementia Maternal Grandmother    Diabetes Paternal Grandfather    Other Paternal Grandmother        died during childbirth   Other Brother        back problems   Cancer Sister        lung   Dementia Brother    Alzheimer's disease Brother    Colon cancer Neg Hx    Liver disease Neg Hx    ADD / ADHD Neg Hx    Alcohol abuse Neg Hx    Drug  abuse Neg Hx    Anxiety disorder Neg Hx    OCD Neg Hx    Bipolar disorder Neg Hx    Paranoid behavior Neg Hx    Schizophrenia Neg Hx    Seizures Neg Hx    Sexual abuse Neg Hx    Physical abuse Neg Hx    Gastric cancer Neg Hx    Esophageal cancer Neg Hx     Social History   Socioeconomic History   Marital status: Married    Spouse name: Not on file   Number of children: 2   Years of education: Not on file  Highest education level: Not on file  Occupational History   Occupation: unemployed    Employer: UNEMPLOYED  Tobacco Use   Smoking status: Never   Smokeless tobacco: Never  Vaping Use   Vaping status: Never Used  Substance and Sexual Activity   Alcohol use: No   Drug use: No   Sexual activity: Not Currently    Birth control/protection: Post-menopausal  Other Topics Concern   Not on file  Social History Narrative   Not on file   Social Drivers of Health   Financial Resource Strain: Not on file  Food Insecurity: Not on file  Transportation Needs: Not on file  Physical Activity: Not on file  Stress: Not on file  Social Connections: Not on file  Intimate Partner Violence: Not on file    Review of Systems   See HPI, otherwise negative ROS  Physical Exam: BP 139/79 (BP Location: Left Arm, Patient Position: Sitting, Cuff Size: Large)   Pulse 73   Temp 98.3 F (36.8 C) (Oral)   Ht 5' 4 (1.626 m)   Wt 200 lb 6.4 oz (90.9 kg)   SpO2 97%   BMI 34.40 kg/m  General:   Alert,  Well-developed, well-nourished, pleasant and cooperative in NAD Neck:  Supple; no masses or thyromegaly. No significant cervical adenopathy. Lungs:  Clear throughout to auscultation.   No wheezes, crackles, or rhonchi. No acute distress. Heart:  Regular rate and rhythm; no murmurs, clicks, rubs,  or gallops. Abdomen: Non-distended, normal bowel sounds.  Soft and nontender without appreciable mass or hepatosplenomegaly.    Impression/Plan:   Very pleasant 68 year old lady with polyp  burden in her colon multiple polyps removed 2022; due for surveillance now.  Recent diarrheal illness of uncertain significance or etiology.  Does take antibiotics periodically.  Multiple possible etiologies.  GERD in the setting of short segment Barrett's esophagus well-controlled on twice daily omeprazole   Recommendations:  As discussed, colonoscopy now given the large number of polyps removed 3 years ago(ASA 3-room 1 or 2 okay).  The risks, benefits, limitations, alternatives and imponderables have been reviewed with the patient. Questions have been answered. All parties are agreeable.    For your Barrett's esophagus, you should have a repeat upper endoscopy 2 years from now.  Continue omeprazole  twice daily for control of reflux.  Because of your recent diarrhea, we will do stool studies (GIP and C. difficile) prior to pursuing colonoscopy  You will need to stop your Ozempic 1 week prior to colonoscopy.      Notice: This dictation was prepared with Dragon dictation along with smaller phrase technology. Any transcriptional errors that result from this process are unintentional and may not be corrected upon review.

## 2024-06-30 NOTE — Progress Notes (Signed)
 Gastroenterology Progress Note    Primary Care Physician:  Bertell Satterfield, MD Primary Gastroenterologist:  Dr. Shaaron  Pre-Procedure History & Physical: HPI:  Aimee Reed is a 68 y.o. female here for early interval colonoscopy.  Had 10 adenomas removed from her colon 3 years ago.  She is here to schedule surveillance exam.  Notes 1 month history of incessant nonbloody watery diarrhea multiple stools in the morning in contrast to going 3 to 4 days at times without a bowel movement.  No bleeding.  Does take antibiotics periodically for UTIs.  No abdominal pain short segment Barrett's diagnosed 2022 will be due for surveillance EGD 2027 reflux well-controlled on omeprazole  twice daily; she has been on Ozempic for over a year now has lost 30 pounds.  She reports her hemoglobin A1c is now 5.1.  Past Medical History:  Diagnosis Date   Anxiety    Blepharospasm    Depression    Diabetes mellitus, type II (HCC)    GERD (gastroesophageal reflux disease)    HTN (hypertension)    Hyperlipidemia 03/11/2012   Irritable bowel syndrome    Overactive bladder    Recurrent UTI    chronic macrodantin   Sleep apnea     Past Surgical History:  Procedure Laterality Date   BIOPSY  11/18/2020   Procedure: BIOPSY;  Surgeon: Shaaron Lamar CHRISTELLA, MD;  Location: [facility];  Service: Endoscopy;;   CARPAL TUNNEL RELEASE     both hands   CHOLECYSTECTOMY     COLONOSCOPY     Dr. Mavis, around 2010   COLONOSCOPY N/A 02/25/2013   MFM:rnonwpr polyps, next tcs 02/2018 give tubular adenomas   COLONOSCOPY WITH PROPOFOL  N/A 05/05/2021   Procedure: COLONOSCOPY WITH PROPOFOL ;  Surgeon: Shaaron Lamar CHRISTELLA, MD;  Location: [facility];  Service: Endoscopy;  Laterality: N/A;  7:30am   ESOPHAGOGASTRODUODENOSCOPY  07/31/2011   MFM:Nrrlou cervical esophageal web s/p dilation,query short segment Barrett's esophagus s/p bx (Barrett's esophagus without dysplasia),small hiatal hernia. 56 French Maloney dilator.    ESOPHAGOGASTRODUODENOSCOPY (EGD) WITH ESOPHAGEAL DILATION N/A 02/25/2013   MFM:jawnmfjo distal esophagus/HH. next EGD 02/2016 given prior h/o Barrett's   ESOPHAGOGASTRODUODENOSCOPY (EGD) WITH PROPOFOL  N/A 11/18/2020   Barrett's esophagus biopsy confirmed. no esophageal stricture s/p dilation for history of dysphagia, small hiatal hernia.   EYE SURGERY  07/13/2015   Eyelids, nerve issues   MALONEY DILATION N/A 11/18/2020   Procedure: MALONEY DILATION;  Surgeon: Shaaron Lamar CHRISTELLA, MD;  [facility]: [facility];  Service: Endoscopy;  Laterality: N/A;   POLYPECTOMY  05/05/2021   Procedure: POLYPECTOMY;  Surgeon: Shaaron Lamar CHRISTELLA, MD;  [facility]: [facility];  Service: Endoscopy;;    Prior to Admission medications   Medication Sig Start Date End Date Taking? Authorizing Provider  amLODipine (NORVASC) 10 MG tablet Take 10 mg by mouth daily. 04/24/24  Yes [provider]  Apple Cider Vinegar 500 MG TABS Take 500 mg by mouth 2 (two) times daily.   Yes [provider]  ascorbic acid (VITAMIN C) 500 MG tablet Take 500 mg by mouth daily.   Yes [provider]  aspirin EC 81 MG tablet Take 81 mg by mouth at bedtime.   Yes [provider]  Carboxymethylcellulose Sodium (THERATEARS) 0.25 % SOLN Place 1 drop into both eyes daily.   Yes [provider]  CINNAMON PO Take 1,200 mg by mouth in the morning and at bedtime.   Yes [provider]  cyclobenzaprine (FLEXERIL) 5 MG tablet  Take 5 mg by mouth 3 (three) times daily as needed. 05/23/24  Yes [provider]  DULoxetine  (CYMBALTA ) 60 MG capsule Take 60 mg by mouth daily.   Yes [provider]  ferrous sulfate 325 (65 FE) MG EC tablet Take 325 mg by mouth daily with breakfast.   Yes [provider]  loratadine (CLARITIN) 10 MG tablet Take 10 mg by mouth daily.   Yes [provider]  losartan (COZAAR) 100 MG tablet Take 100 mg by mouth daily.   Yes [provider]   meloxicam (MOBIC) 7.5 MG tablet Take 7.5 mg by mouth daily. 10/02/22  Yes [provider]  Multiple Vitamins-Minerals (HAIR SKIN & NAILS) TABS Take 1 tablet by mouth 3 (three) times daily.   Yes [provider]  NON FORMULARY Take 2 tablets by mouth daily. Lions Mane Shrooms 2400 mg   Yes [provider]  omeprazole  (PRILOSEC) 40 MG capsule Take 40 mg by mouth 2 (two) times daily. 04/21/21  Yes [provider]  OnabotulinumtoxinA (BOTOX IJ) Inject as directed. Every 3 months 6 injection around each eye   Yes [provider]  oxybutynin (DITROPAN) 5 MG tablet Take 5 mg by mouth 2 (two) times daily.   Yes [provider]  OZEMPIC, 0.25 OR 0.5 MG/DOSE, 2 MG/3ML SOPN Inject 0.5 mg into the skin once a week. 11/03/22  Yes [provider]  propranolol  (INDERAL ) 10 MG tablet Take 20 mg by mouth in the morning. & 10 mg at night   Yes [provider]  rosuvastatin (CRESTOR) 5 MG tablet Take 5 mg by mouth every Saturday.   Yes [provider]  sitaGLIPtin -metformin  (JANUMET ) 50-1000 MG tablet Take 1 tablet by mouth 2 (two) times daily with a meal.   Yes [provider]  zolpidem  (AMBIEN ) 5 MG tablet 2.5- 5 mg at night as needed for sleep Patient taking differently: Take 5 mg by mouth at bedtime as needed. 07/25/18  Yes Vickey Mettle, MD    Allergies as of 06/30/2024   (No Known Allergies)    Family History  Problem Relation Age of Onset   Depression Father    Ulcers Mother    Alzheimer's disease Mother    Dementia Mother    Alzheimer's disease Maternal Grandmother    Dementia Maternal Grandmother    Diabetes Paternal Grandfather    Other Paternal Grandmother        died during childbirth   Other Brother        back problems   Cancer Sister        lung   Dementia Brother    Alzheimer's disease Brother    Colon cancer Neg Hx    Liver disease Neg Hx    ADD / ADHD Neg Hx    Alcohol abuse Neg Hx    Drug  abuse Neg Hx    Anxiety disorder Neg Hx    OCD Neg Hx    Bipolar disorder Neg Hx    Paranoid behavior Neg Hx    Schizophrenia Neg Hx    Seizures Neg Hx    Sexual abuse Neg Hx    Physical abuse Neg Hx    Gastric cancer Neg Hx    Esophageal cancer Neg Hx     Social History   Socioeconomic History   Marital status: Married    Spouse name: Not on file   Number of children: 2   Years of education: Not on file  Highest education level: Not on file  Occupational History   Occupation: unemployed    Employer: UNEMPLOYED  Tobacco Use   Smoking status: Never   Smokeless tobacco: Never  Vaping Use   Vaping status: Never Used  Substance and Sexual Activity   Alcohol use: No   Drug use: No   Sexual activity: Not Currently    Birth control/protection: Post-menopausal  Other Topics Concern   Not on file  Social History Narrative   Not on file   Social Drivers of Health   Financial Resource Strain: Not on file  Food Insecurity: Not on file  Transportation Needs: Not on file  Physical Activity: Not on file  Stress: Not on file  Social Connections: Not on file  Intimate Partner Violence: Not on file    Review of Systems   See HPI, otherwise negative ROS  Physical Exam: BP 139/79 (BP Location: Left Arm, Patient Position: Sitting, Cuff Size: Large)   Pulse 73   Temp 98.3 F (36.8 C) (Oral)   Ht 5' 4 (1.626 m)   Wt 200 lb 6.4 oz (90.9 kg)   SpO2 97%   BMI 34.40 kg/m  General:   Alert,  Well-developed, well-nourished, pleasant and cooperative in NAD Neck:  Supple; no masses or thyromegaly. No significant cervical adenopathy. Lungs:  Clear throughout to auscultation.   No wheezes, crackles, or rhonchi. No acute distress. Heart:  Regular rate and rhythm; no murmurs, clicks, rubs,  or gallops. Abdomen: Non-distended, normal bowel sounds.  Soft and nontender without appreciable mass or hepatosplenomegaly.    Impression/Plan:   Very pleasant 68 year old lady with polyp  burden in her colon multiple polyps removed 2022; due for surveillance now.  Recent diarrheal illness of uncertain significance or etiology.  Does take antibiotics periodically.  Multiple possible etiologies.  GERD in the setting of short segment Barrett's esophagus well-controlled on twice daily omeprazole   Recommendations:  As discussed, colonoscopy now given the large number of polyps removed 3 years ago(ASA 3-room 1 or 2 okay).  The risks, benefits, limitations, alternatives and imponderables have been reviewed with the patient. Questions have been answered. All parties are agreeable.    For your Barrett's esophagus, you should have a repeat upper endoscopy 2 years from now.  Continue omeprazole  twice daily for control of reflux.  Because of your recent diarrhea, we will do stool studies (GIP and C. difficile) prior to pursuing colonoscopy  You will need to stop your Ozempic 1 week prior to colonoscopy.      Notice: This dictation was prepared with Dragon dictation along with smaller phrase technology. Any transcriptional errors that result from this process are unintentional and may not be corrected upon review.

## 2024-07-02 DIAGNOSIS — R197 Diarrhea, unspecified: Secondary | ICD-10-CM | POA: Diagnosis not present

## 2024-07-04 ENCOUNTER — Ambulatory Visit: Payer: Self-pay | Admitting: Internal Medicine

## 2024-07-04 LAB — GI PROFILE, STOOL, PCR

## 2024-07-06 LAB — C DIFFICILE, CYTOTOXIN B

## 2024-07-06 LAB — C DIFFICILE TOXINS A+B W/RFLX: C difficile Toxins A+B, EIA: NEGATIVE

## 2024-07-08 ENCOUNTER — Other Ambulatory Visit: Payer: Self-pay | Admitting: *Deleted

## 2024-07-08 ENCOUNTER — Encounter: Payer: Self-pay | Admitting: *Deleted

## 2024-07-08 MED ORDER — PEG 3350-KCL-NA BICARB-NACL 420 G PO SOLR: 4000.0000 mL | Freq: Once | ORAL | 0 refills | Status: AC

## 2024-07-18 ENCOUNTER — Encounter (HOSPITAL_COMMUNITY)
Admission: RE | Admit: 2024-07-18 | Discharge: 2024-07-18 | Disposition: A | Discharge status: Home / Self Care | Source: Ambulatory Visit | Attending: Internal Medicine | Admitting: Internal Medicine

## 2024-07-21 NOTE — Progress Notes (Signed)
 Aimee Reed                                          MRN: [id number]   07/21/2024   The [facility] Quality Team Specialist reviewed this patient medical record for the purposes of chart review for care gap closure. The following were reviewed: chart review for care gap closure-kidney health evaluation for diabetes:eGFR  and uACR.    [facility] Quality Team

## 2024-07-23 ENCOUNTER — Ambulatory Visit (HOSPITAL_COMMUNITY): Admitting: Anesthesiology

## 2024-07-23 ENCOUNTER — Encounter (HOSPITAL_COMMUNITY): Admission: RE | Disposition: A | Payer: Self-pay | Source: Home / Self Care | Attending: Internal Medicine

## 2024-07-23 ENCOUNTER — Ambulatory Visit (HOSPITAL_COMMUNITY)
Admission: RE | Admit: 2024-07-23 | Discharge: 2024-07-23 | Disposition: A | Discharge status: Home / Self Care | Attending: Internal Medicine | Admitting: Internal Medicine

## 2024-07-23 ENCOUNTER — Encounter (HOSPITAL_COMMUNITY): Payer: Self-pay | Admitting: Internal Medicine

## 2024-07-23 DIAGNOSIS — K5730 Diverticulosis of large intestine without perforation or abscess without bleeding: Secondary | ICD-10-CM | POA: Diagnosis not present

## 2024-07-23 DIAGNOSIS — E66813 Obesity, class 3: Secondary | ICD-10-CM | POA: Diagnosis not present

## 2024-07-23 DIAGNOSIS — R197 Diarrhea, unspecified: Secondary | ICD-10-CM | POA: Insufficient documentation

## 2024-07-23 DIAGNOSIS — Z833 Family history of diabetes mellitus: Secondary | ICD-10-CM | POA: Diagnosis not present

## 2024-07-23 DIAGNOSIS — Z860100 Personal history of colon polyps, unspecified: Secondary | ICD-10-CM

## 2024-07-23 DIAGNOSIS — Z860101 Personal history of adenomatous and serrated colon polyps: Secondary | ICD-10-CM | POA: Diagnosis not present

## 2024-07-23 DIAGNOSIS — Z6834 Body mass index (BMI) 34.0-34.9, adult: Secondary | ICD-10-CM | POA: Insufficient documentation

## 2024-07-23 DIAGNOSIS — Z7982 Long term (current) use of aspirin: Secondary | ICD-10-CM | POA: Insufficient documentation

## 2024-07-23 DIAGNOSIS — F419 Anxiety disorder, unspecified: Secondary | ICD-10-CM | POA: Insufficient documentation

## 2024-07-23 DIAGNOSIS — I10 Essential (primary) hypertension: Secondary | ICD-10-CM

## 2024-07-23 DIAGNOSIS — Z1211 Encounter for screening for malignant neoplasm of colon: Secondary | ICD-10-CM | POA: Insufficient documentation

## 2024-07-23 DIAGNOSIS — Z79899 Other long term (current) drug therapy: Secondary | ICD-10-CM | POA: Insufficient documentation

## 2024-07-23 DIAGNOSIS — E119 Type 2 diabetes mellitus without complications: Secondary | ICD-10-CM | POA: Insufficient documentation

## 2024-07-23 DIAGNOSIS — E785 Hyperlipidemia, unspecified: Secondary | ICD-10-CM | POA: Diagnosis not present

## 2024-07-23 DIAGNOSIS — K5790 Diverticulosis of intestine, part unspecified, without perforation or abscess without bleeding: Secondary | ICD-10-CM

## 2024-07-23 DIAGNOSIS — F32A Depression, unspecified: Secondary | ICD-10-CM | POA: Diagnosis not present

## 2024-07-23 DIAGNOSIS — Z7985 Long-term (current) use of injectable non-insulin antidiabetic drugs: Secondary | ICD-10-CM | POA: Insufficient documentation

## 2024-07-23 DIAGNOSIS — Z7984 Long term (current) use of oral hypoglycemic drugs: Secondary | ICD-10-CM | POA: Insufficient documentation

## 2024-07-23 DIAGNOSIS — Q438 Other specified congenital malformations of intestine: Secondary | ICD-10-CM | POA: Insufficient documentation

## 2024-07-23 DIAGNOSIS — G4730 Sleep apnea, unspecified: Secondary | ICD-10-CM | POA: Diagnosis not present

## 2024-07-23 DIAGNOSIS — Z791 Long term (current) use of non-steroidal anti-inflammatories (NSAID): Secondary | ICD-10-CM | POA: Insufficient documentation

## 2024-07-23 DIAGNOSIS — K219 Gastro-esophageal reflux disease without esophagitis: Secondary | ICD-10-CM | POA: Diagnosis not present

## 2024-07-23 HISTORY — PX: COLONOSCOPY: SHX5424

## 2024-07-23 LAB — GLUCOSE, CAPILLARY: Glucose-Capillary: 106 mg/dL — ABNORMAL HIGH (ref 70–99)

## 2024-07-23 SURGERY — COLONOSCOPY
Anesthesia: Monitor Anesthesia Care

## 2024-07-23 MED ORDER — PROPOFOL 10 MG/ML IV BOLUS
INTRAVENOUS | Status: DC | PRN
  Administered 2024-07-23: 175 ug/kg/min via INTRAVENOUS
  Administered 2024-07-23: 100 mg via INTRAVENOUS

## 2024-07-23 MED ORDER — LIDOCAINE HCL (CARDIAC) PF 100 MG/5ML IV SOSY
PREFILLED_SYRINGE | INTRAVENOUS | Status: DC | PRN
  Administered 2024-07-23: 50 mg via INTRAVENOUS

## 2024-07-23 MED ORDER — LACTATED RINGERS IV SOLN: INTRAVENOUS | Status: DC

## 2024-07-23 NOTE — Discharge Instructions (Signed)
  Colonoscopy Discharge Instructions  Read the instructions outlined below and refer to this sheet in the next few weeks. These discharge instructions provide you with general information on caring for yourself after you leave the hospital. Your doctor may also give you specific instructions. While your treatment has been planned according to the most current medical practices available, unavoidable complications occasionally occur. If you have any problems or questions after discharge, call Dr. Shaaron at [phone]. ACTIVITY You may resume your regular activity, but move at a slower pace for the next 24 hours.  Take frequent rest periods for the next 24 hours.  Walking will help get rid of the air and reduce the bloated feeling in your belly (abdomen).  No driving for 24 hours (because of the medicine (anesthesia) used during the test).   Do not sign any important legal documents or operate any machinery for 24 hours (because of the anesthesia used during the test).  NUTRITION Drink plenty of fluids.  You may resume your normal diet as instructed by your doctor.  Begin with a light meal and progress to your normal diet. Heavy or fried foods are harder to digest and may make you feel sick to your stomach (nauseated).  Avoid alcoholic beverages for 24 hours or as instructed.  MEDICATIONS You may resume your normal medications unless your doctor tells you otherwise.  WHAT YOU CAN EXPECT TODAY Some feelings of bloating in the abdomen.  Passage of more gas than usual.  Spotting of blood in your stool or on the toilet paper.  IF YOU HAD POLYPS REMOVED DURING THE COLONOSCOPY: No aspirin products for 7 days or as instructed.  No alcohol for 7 days or as instructed.  Eat a soft diet for the next 24 hours.  FINDING OUT THE RESULTS OF YOUR TEST Not all test results are available during your visit. If your test results are not back during the visit, make an appointment with your caregiver to find out the  results. Do not assume everything is normal if you have not heard from your caregiver or the medical facility. It is important for you to follow up on all of your test results.  SEEK IMMEDIATE MEDICAL ATTENTION IF: You have more than a spotting of blood in your stool.  Your belly is swollen (abdominal distention).  You are nauseated or vomiting.  You have a temperature over 101.  You have abdominal pain or discomfort that is severe or gets worse throughout the day.       No polyps found today!  Samples taken to evaluate your diarrhea   Further.    Further recommendations to follow pending review of pathology report  Office visit with us  in 3 months   would  Recommend 1 more colonoscopy in 7 years.  At patient request, I called Creola Bruch at [phone] findings and recommendations

## 2024-07-23 NOTE — Anesthesia Preprocedure Evaluation (Signed)
 Anesthesia Evaluation  Patient identified by MRN, date of birth, ID band Patient awake    Reviewed: Allergy & Precautions, H&P , NPO status , Patient's Chart, lab work & pertinent test results, reviewed documented beta blocker date and time   Airway Mallampati: II  TM Distance: >3 FB Neck ROM: full    Dental no notable dental hx.    Pulmonary sleep apnea    Pulmonary exam normal breath sounds clear to auscultation       Cardiovascular Exercise Tolerance: Good hypertension, + Valvular Problems/Murmurs  Rhythm:regular Rate:Normal     Neuro/Psych  PSYCHIATRIC DISORDERS Anxiety Depression     Neuromuscular disease    GI/Hepatic Neg liver ROS,GERD  ,,  Endo/Other  diabetes  Class 3 obesity  Renal/GU negative Renal ROS  negative genitourinary   Musculoskeletal   Abdominal   Peds  Hematology negative hematology ROS (+)   Anesthesia Other Findings   Reproductive/Obstetrics negative OB ROS                              Anesthesia Physical Anesthesia Plan  ASA: 3  Anesthesia Plan: MAC   Post-op Pain Management:    Induction:   PONV Risk Score and Plan: Propofol  infusion  Airway Management Planned:   Additional Equipment:   Intra-op Plan:   Post-operative Plan:   Informed Consent: I have reviewed the patients History and Physical, chart, labs and discussed the procedure including the risks, benefits and alternatives for the proposed anesthesia with the patient or authorized representative who has indicated his/her understanding and acceptance.     Dental Advisory Given  Plan Discussed with: CRNA  Anesthesia Plan Comments:         Anesthesia Quick Evaluation

## 2024-07-23 NOTE — Interval H&P Note (Signed)
 History and Physical Interval Note:  07/23/2024 9:39 AM  Aimee Reed  has presented today for surgery, with the diagnosis of history of polyps.  The various methods of treatment have been discussed with the patient and family. After consideration of risks, benefits and other options for treatment, the patient has consented to  Procedure(s) with comments: COLONOSCOPY (N/A) - 9:15 am, asa 3 as a surgical intervention.  The patient's history has been reviewed, patient examined, no change in status, stable for surgery.  I have reviewed the patient's chart and labs.  Questions were answered to the patient's satisfaction.     Mordecai Tindol    GIP positive for norovirus otherwise no change.  Surveillance colonoscopy per plan.The risks, benefits, limitations, alternatives and imponderables have been reviewed with the patient. Questions have been answered. All parties are agreeable.    Patient still has diarrhea.

## 2024-07-23 NOTE — Transfer of Care (Signed)
 Immediate Anesthesia Transfer of Care Note  Patient: Aimee Reed  Procedure(s) Performed: COLONOSCOPY  Patient Location: Short Stay  Anesthesia Type:General  Level of Consciousness: awake, alert , oriented, and patient cooperative  Airway & Oxygen Therapy: Patient Spontanous Breathing  Post-op Assessment: Report given to RN and Post -op Vital signs reviewed and stable  Post vital signs: Reviewed and stable  Last Vitals:  Vitals Value Taken Time  BP 127/90 07/23/24 10:16  Temp 36.7 C 07/23/24 10:16  Pulse 79 07/23/24 10:16  Resp 17 07/23/24 10:16  SpO2 99 % 07/23/24 10:16    Last Pain:  Vitals:   07/23/24 1016  TempSrc: Oral  PainSc: 0-No pain      Patients Stated Pain Goal: 6 (07/23/24 0803)  Complications: No notable events documented.

## 2024-07-23 NOTE — Op Note (Signed)
 [facility] Patient Name: Aimee Reed Procedure Date: 07/23/2024 9:25 AM MRN: [id number] Date of Birth: [birth year] Attending MD: Lamar Ozell Hollingshead , MD, [id number] CSN: [id number] Age: 68 Admit Type: Outpatient Procedure:                Colonoscopy Indications:              High risk colon cancer surveillance: Personal                            history of colonic polyps Providers:                Lamar Ozell Hollingshead, MD, Crystal Page, Chad                            Wilson, Technician Referring MD:              Medicines:                Propofol  per Anesthesia Complications:            No immediate complications. Estimated Blood Loss:     Estimated blood loss was minimal. Procedure:                Pre-Anesthesia Assessment:                           - Prior to the procedure, a History and Physical                            was performed, and patient medications and                            allergies were reviewed. The patient's tolerance of                            previous anesthesia was also reviewed. The risks                            and benefits of the procedure and the sedation                            options and risks were discussed with the patient.                            All questions were answered, and informed consent                            was obtained. Prior Anticoagulants: The patient has                            taken no anticoagulant or antiplatelet agents. ASA                            Grade Assessment: III - A patient with severe  systemic disease. After reviewing the risks and                            benefits, the patient was deemed in satisfactory                            condition to undergo the procedure.                           After obtaining informed consent, the colonoscope                            was passed under direct vision. Throughout the                            procedure, the  patient's blood pressure, pulse, and                            oxygen saturations were monitored continuously. The                            CF-[id number] ([id number]) Colon was introduced through                            the anus and advanced to the 5 cm into the ileum.                            The colonoscopy was performed without difficulty.                            The patient tolerated the procedure well. The                            quality of the bowel preparation was adequate. The                            terminal ileum, ileocecal valve, appendiceal                            orifice, and rectum were photographed. Scope In: 9:51:52 AM Scope Out: 10:12:44 AM Scope Withdrawal Time: 0 hours 9 minutes 42 seconds  Total Procedure Duration: 0 hours 20 minutes 52 seconds  Findings:      The perianal and digital rectal examinations were normal.      Scattered small-mouthed diverticula were found in the sigmoid colon and       descending colon. Redundant colon requiring strategically placed       abdominal pressure to reach the cecum.      The exam was otherwise without abnormality on direct and retroflexion       views. Distal 10 cm of TI appeared normal. Random biopsies of the colon       were taken to assess for microscopic colitis Impression:               - Diverticulosis in the sigmoid colon and in the  descending colon. Redundant colon. Normal-appearing                            terminal ileum.                           - The examination was otherwise normal on direct                            and retroflexion views.                           - Status post colonic mucosa biopsy, GIP positive                            for norovirus several weeks ago. No vomiting. Still                            has diarrhea. 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 Ruling out                            microscopic colitis Moderate Sedation:      Moderate (conscious) sedation  was personally administered by an       anesthesia professional. The following parameters were monitored: oxygen       saturation, heart rate, blood pressure, respiratory rate, EKG, adequacy       of pulmonary ventilation, and response to care. Recommendation:           - Patient has a contact number available for                            emergencies. The signs and symptoms of potential                            delayed complications were discussed with the                            patient. Return to normal activities tomorrow.                            Written discharge instructions were provided to the                            patient.                           - Advance diet as tolerated.                           - Repeat colonoscopy in 7 years for surveillance.                           - Return to GI office in 3 months. Procedure Code(s):        --- Professional ---  54621, Colonoscopy, flexible; diagnostic, including                            collection of specimen(s) by brushing or washing,                            when performed (separate procedure) Diagnosis Code(s):        --- Professional ---                           Z86.010, Personal history of colonic polyps                           K57.30, Diverticulosis of large intestine without                            perforation or abscess without bleeding CPT copyright 2022 [employer]. All rights reserved. The codes documented in this report are preliminary and upon coder review may  be revised to meet current compliance requirements. Lamar HERO. Tilla Wilborn, MD Lamar Ozell Hollingshead, MD 07/23/2024 10:30:31 AM This report has been signed electronically. Number of Addenda: 0

## 2024-07-24 ENCOUNTER — Encounter (HOSPITAL_COMMUNITY): Payer: Self-pay | Admitting: Internal Medicine

## 2024-07-25 LAB — SURGICAL PATHOLOGY

## 2024-07-25 NOTE — Anesthesia Postprocedure Evaluation (Signed)
 Anesthesia Post Note  Patient: Aimee Reed  Procedure(s) Performed: COLONOSCOPY  Patient location during evaluation: Phase II Anesthesia Type: MAC Level of consciousness: awake Pain management: pain level controlled Vital Signs Assessment: post-procedure vital signs reviewed and stable Respiratory status: spontaneous breathing and respiratory function stable Cardiovascular status: blood pressure returned to baseline and stable Postop Assessment: no headache and no apparent nausea or vomiting Anesthetic complications: no Comments: Late entry   No notable events documented.   Last Vitals:  Vitals:   07/23/24 0803 07/23/24 1016  BP: (!) 154/67 (!) 127/90  Pulse: 80 79  Resp: 20 17  Temp: 36.9 C 36.7 C  SpO2: 94% 99%    Last Pain:  Vitals:   07/24/24 0947  TempSrc:   PainSc: 0-No pain                 Yvonna JINNY Bosworth

## 2024-07-26 ENCOUNTER — Ambulatory Visit: Payer: Self-pay | Admitting: Internal Medicine

## 2024-07-28 ENCOUNTER — Encounter: Payer: Self-pay | Admitting: *Deleted

## 2024-07-28 NOTE — Progress Notes (Signed)
 Aimee Reed                                          MRN: [id number]   07/28/2024   The [facility] Quality Team Specialist reviewed this patient medical record for the purposes of chart review for care gap closure. The following were reviewed: chart review for care gap closure-glycemic status assessment and kidney health evaluation for diabetes:eGFR  and uACR.    [facility] Quality Team
